# Patient Record
Sex: Male | Born: 1945 | Race: White | Hispanic: No | Marital: Married
Health system: Southern US, Academic
[De-identification: ages and names within clinical notes are randomized; demographics above are authoritative.]

## PROBLEM LIST (undated history)

## (undated) ENCOUNTER — Encounter: Attending: Internal Medicine | Primary: Internal Medicine

## (undated) ENCOUNTER — Encounter

## (undated) ENCOUNTER — Ambulatory Visit: Payer: MEDICARE

## (undated) ENCOUNTER — Ambulatory Visit: Payer: MEDICARE | Attending: Orthopaedic Trauma | Primary: Orthopaedic Trauma

## (undated) ENCOUNTER — Telehealth

## (undated) ENCOUNTER — Encounter
Attending: Student in an Organized Health Care Education/Training Program | Primary: Student in an Organized Health Care Education/Training Program

## (undated) ENCOUNTER — Telehealth: Attending: Internal Medicine | Primary: Internal Medicine

## (undated) ENCOUNTER — Encounter: Attending: Physician Assistant | Primary: Physician Assistant

## (undated) ENCOUNTER — Encounter
Attending: Pharmacist Clinician (PhC)/ Clinical Pharmacy Specialist | Primary: Pharmacist Clinician (PhC)/ Clinical Pharmacy Specialist

## (undated) ENCOUNTER — Ambulatory Visit: Payer: MEDICARE | Attending: Cardiovascular Disease | Primary: Cardiovascular Disease

## (undated) ENCOUNTER — Ambulatory Visit

## (undated) ENCOUNTER — Ambulatory Visit: Attending: Radiation Oncology | Primary: Radiation Oncology

## (undated) ENCOUNTER — Encounter: Attending: Cardiovascular Disease | Primary: Cardiovascular Disease

## (undated) ENCOUNTER — Ambulatory Visit: Attending: Physician Assistant | Primary: Physician Assistant

## (undated) ENCOUNTER — Telehealth: Attending: Hematology & Oncology | Primary: Hematology & Oncology

## (undated) ENCOUNTER — Inpatient Hospital Stay

## (undated) ENCOUNTER — Telehealth
Attending: Pharmacist Clinician (PhC)/ Clinical Pharmacy Specialist | Primary: Pharmacist Clinician (PhC)/ Clinical Pharmacy Specialist

## (undated) ENCOUNTER — Ambulatory Visit: Payer: MEDICARE | Attending: Radiation Oncology | Primary: Radiation Oncology

## (undated) ENCOUNTER — Telehealth
Attending: Student in an Organized Health Care Education/Training Program | Primary: Student in an Organized Health Care Education/Training Program

## (undated) ENCOUNTER — Encounter: Attending: Radiation Oncology | Primary: Radiation Oncology

## (undated) ENCOUNTER — Telehealth: Attending: Nurse Practitioner | Primary: Nurse Practitioner

## (undated) DIAGNOSIS — N289 Disorder of kidney and ureter, unspecified: Secondary | ICD-10-CM

## (undated) DIAGNOSIS — Z5189 Encounter for other specified aftercare: Secondary | ICD-10-CM

## (undated) DIAGNOSIS — G4730 Sleep apnea, unspecified: Secondary | ICD-10-CM

## (undated) DIAGNOSIS — I509 Heart failure, unspecified: Secondary | ICD-10-CM

## (undated) DIAGNOSIS — T7840XA Allergy, unspecified, initial encounter: Secondary | ICD-10-CM

## (undated) DIAGNOSIS — C61 Malignant neoplasm of prostate: Secondary | ICD-10-CM

## (undated) DIAGNOSIS — H269 Unspecified cataract: Secondary | ICD-10-CM

## (undated) DIAGNOSIS — C7951 Secondary malignant neoplasm of bone: Secondary | ICD-10-CM

## (undated) DIAGNOSIS — R011 Cardiac murmur, unspecified: Secondary | ICD-10-CM

## (undated) DIAGNOSIS — K250 Acute gastric ulcer with hemorrhage: Secondary | ICD-10-CM

## (undated) DIAGNOSIS — F419 Anxiety disorder, unspecified: Secondary | ICD-10-CM

## (undated) DIAGNOSIS — M1990 Unspecified osteoarthritis, unspecified site: Secondary | ICD-10-CM

## (undated) DIAGNOSIS — I219 Acute myocardial infarction, unspecified: Secondary | ICD-10-CM

## (undated) DIAGNOSIS — F32A Depression, unspecified: Secondary | ICD-10-CM

## (undated) DIAGNOSIS — K4090 Unilateral inguinal hernia, without obstruction or gangrene, not specified as recurrent: Secondary | ICD-10-CM

## (undated) HISTORY — DX: Heart failure, unspecified: I50.9

## (undated) HISTORY — DX: Anxiety disorder, unspecified: F41.9

## (undated) HISTORY — PX: EYE SURGERY: SHX253

## (undated) HISTORY — DX: Depression, unspecified: F32.A

## (undated) HISTORY — DX: Unspecified cataract: H26.9

## (undated) HISTORY — DX: Sleep apnea, unspecified: G47.30

## (undated) HISTORY — PX: GASTRIC BYPASS: SHX52

## (undated) HISTORY — PX: CHOLECYSTECTOMY: SHX55

## (undated) HISTORY — DX: Encounter for other specified aftercare: Z51.89

## (undated) HISTORY — PX: OTHER SURGICAL HISTORY: SHX169

## (undated) HISTORY — PX: CORONARY ARTERY BYPASS GRAFT: SHX141

## (undated) HISTORY — DX: Cardiac murmur, unspecified: R01.1

## (undated) HISTORY — DX: Allergy, unspecified, initial encounter: T78.40XA

## (undated) HISTORY — DX: Unspecified osteoarthritis, unspecified site: M19.90

## (undated) HISTORY — DX: Acute myocardial infarction, unspecified: I21.9

## (undated) HISTORY — DX: Secondary malignant neoplasm of bone: C79.51

## (undated) HISTORY — DX: Malignant neoplasm of prostate: C61

## (undated) MED ORDER — HYOSCYAMINE 0.125 MG SUBLINGUAL TABLET: ORAL_TABLET | SUBLINGUAL | 0 refills | 2 days | PRN

## (undated) MED ORDER — DEXAMETHASONE 2 MG TABLET: Freq: Every day | ORAL | 0 days

## (undated) MED ORDER — ACETAMINOPHEN 650 MG RECTAL SUPPOSITORY: Freq: Four times a day (QID) | RECTAL | 0 refills | 1 days | PRN

## (undated) MED ORDER — DOCUSATE SODIUM 100 MG CAPSULE: Freq: Two times a day (BID) | ORAL | 0 days

## (undated) MED ORDER — HALOPERIDOL LACTATE 2 MG/ML ORAL CONCENTRATE: Freq: Four times a day (QID) | ORAL | 0 refills | 8.00000 days | PRN

## (undated) MED ORDER — LIDOCAINE 5 % TOPICAL PATCH: TRANSDERMAL | 0 days

## (undated) MED ORDER — ASPIRIN 81 MG TABLET,DELAYED RELEASE: Freq: Every day | ORAL | 0 days

## (undated) MED ORDER — BISACODYL 5 MG TABLET,DELAYED RELEASE: Freq: Every day | ORAL | 0 days | PRN

## (undated) MED ORDER — SENNOSIDES 8.6 MG TABLET: Freq: Every day | ORAL | 0.00000 days

## (undated) MED ORDER — MECLIZINE 25 MG CHEWABLE TABLET: Freq: Three times a day (TID) | ORAL | 0 days | PRN

## (undated) MED ORDER — BISACODYL 10 MG RECTAL SUPPOSITORY: Freq: Every day | RECTAL | 0 refills | 0.00000 days | Status: SS | PRN

## (undated) MED ORDER — OXYCODONE ER 10 MG TABLET,CRUSH RESISTANT,EXTENDED RELEASE 12 HR: Freq: Two times a day (BID) | ORAL | 0 days

## (undated) MED ORDER — ASCORBIC ACID (VITAMIN C) 500 MG TABLET: Freq: Every day | ORAL | 0 days

## (undated) MED ORDER — LORAZEPAM 0.5 MG TABLET: ORAL_TABLET | Freq: Four times a day (QID) | ORAL | 0 refills | 3 days | PRN

## (undated) MED ORDER — MIDODRINE 5 MG TABLET: Freq: Three times a day (TID) | ORAL | 0 days

## (undated) MED ORDER — DRONABINOL 5 MG CAPSULE: Freq: Two times a day (BID) | ORAL | 0 days

## (undated) MED ORDER — POLYSACCHARIDE IRON COMPLEX 150 MG IRON CAPSULE: Freq: Every day | ORAL | 0.00000 days

## (undated) MED ORDER — ACETAMINOPHEN 500 MG TABLET: Freq: Three times a day (TID) | ORAL | 0 days | PRN

## (undated) MED ORDER — POLYETHYLENE GLYCOL 3350 17 GRAM ORAL POWDER PACKET: Freq: Every day | ORAL | 0.00000 days

## (undated) MED ORDER — PROCHLORPERAZINE MALEATE 10 MG TABLET: ORAL_TABLET | Freq: Four times a day (QID) | ORAL | 0 refills | 2 days | PRN

---

## 2015-10-14 DIAGNOSIS — Z952 Presence of prosthetic heart valve: Secondary | ICD-10-CM | POA: Insufficient documentation

## 2015-10-14 DIAGNOSIS — I10 Essential (primary) hypertension: Secondary | ICD-10-CM | POA: Insufficient documentation

## 2018-07-24 ENCOUNTER — Ambulatory Visit: Admit: 2018-07-24 | Discharge: 2018-08-22 | Payer: MEDICARE

## 2018-07-24 DIAGNOSIS — S8012XA Contusion of left lower leg, initial encounter: Principal | ICD-10-CM

## 2018-07-24 DIAGNOSIS — I872 Venous insufficiency (chronic) (peripheral): Secondary | ICD-10-CM

## 2018-12-15 MED ORDER — AMLODIPINE 2.5 MG TABLET
ORAL_TABLET | ORAL | 4 refills | 0.00000 days | Status: CP
Start: 2018-12-15 — End: ?

## 2018-12-18 ENCOUNTER — Ambulatory Visit: Admit: 2018-12-18 | Discharge: 2018-12-19 | Payer: MEDICARE

## 2018-12-18 DIAGNOSIS — E785 Hyperlipidemia, unspecified: Principal | ICD-10-CM

## 2019-01-05 ENCOUNTER — Ambulatory Visit
Admit: 2019-01-05 | Discharge: 2019-01-06 | Payer: MEDICARE | Attending: Cardiovascular Disease | Primary: Cardiovascular Disease

## 2019-01-05 DIAGNOSIS — N183 Chronic kidney disease, stage 3 unspecified: Secondary | ICD-10-CM | POA: Insufficient documentation

## 2019-01-05 DIAGNOSIS — Z953 Presence of xenogenic heart valve: Secondary | ICD-10-CM

## 2019-01-05 DIAGNOSIS — I340 Nonrheumatic mitral (valve) insufficiency: Secondary | ICD-10-CM

## 2019-01-05 DIAGNOSIS — E785 Hyperlipidemia, unspecified: Secondary | ICD-10-CM

## 2019-01-05 DIAGNOSIS — I361 Nonrheumatic tricuspid (valve) insufficiency: Secondary | ICD-10-CM

## 2019-01-05 DIAGNOSIS — Z955 Presence of coronary angioplasty implant and graft: Secondary | ICD-10-CM

## 2019-01-05 DIAGNOSIS — Z951 Presence of aortocoronary bypass graft: Secondary | ICD-10-CM

## 2019-01-05 DIAGNOSIS — I2510 Atherosclerotic heart disease of native coronary artery without angina pectoris: Principal | ICD-10-CM

## 2019-01-05 DIAGNOSIS — I739 Peripheral vascular disease, unspecified: Secondary | ICD-10-CM

## 2019-01-05 DIAGNOSIS — I6523 Occlusion and stenosis of bilateral carotid arteries: Secondary | ICD-10-CM

## 2019-01-05 DIAGNOSIS — I38 Endocarditis, valve unspecified: Secondary | ICD-10-CM

## 2019-01-05 DIAGNOSIS — E1122 Type 2 diabetes mellitus with diabetic chronic kidney disease: Secondary | ICD-10-CM

## 2019-01-05 DIAGNOSIS — R7303 Prediabetes: Secondary | ICD-10-CM | POA: Insufficient documentation

## 2019-03-04 MED ORDER — ATORVASTATIN 10 MG TABLET
ORAL_TABLET | ORAL | 3 refills | 0.00000 days | Status: CP
Start: 2019-03-04 — End: ?

## 2019-07-06 DIAGNOSIS — F17210 Nicotine dependence, cigarettes, uncomplicated: Secondary | ICD-10-CM | POA: Insufficient documentation

## 2019-11-05 ENCOUNTER — Ambulatory Visit: Admit: 2019-11-05 | Discharge: 2019-11-05 | Payer: MEDICARE

## 2019-11-05 ENCOUNTER — Ambulatory Visit
Admit: 2019-11-05 | Discharge: 2019-11-05 | Payer: MEDICARE | Attending: Cardiovascular Disease | Primary: Cardiovascular Disease

## 2019-11-05 DIAGNOSIS — R55 Syncope and collapse: Principal | ICD-10-CM

## 2019-11-05 DIAGNOSIS — E785 Hyperlipidemia, unspecified: Principal | ICD-10-CM

## 2019-11-05 DIAGNOSIS — I2510 Atherosclerotic heart disease of native coronary artery without angina pectoris: Principal | ICD-10-CM

## 2019-11-05 DIAGNOSIS — Z953 Presence of xenogenic heart valve: Principal | ICD-10-CM

## 2019-11-05 DIAGNOSIS — I361 Nonrheumatic tricuspid (valve) insufficiency: Principal | ICD-10-CM

## 2019-11-05 DIAGNOSIS — Z87891 Personal history of nicotine dependence: Principal | ICD-10-CM

## 2019-11-05 DIAGNOSIS — Z951 Presence of aortocoronary bypass graft: Principal | ICD-10-CM

## 2019-11-05 DIAGNOSIS — I38 Endocarditis, valve unspecified: Principal | ICD-10-CM

## 2019-11-05 DIAGNOSIS — I340 Nonrheumatic mitral (valve) insufficiency: Principal | ICD-10-CM

## 2019-11-05 DIAGNOSIS — Z955 Presence of coronary angioplasty implant and graft: Principal | ICD-10-CM

## 2019-11-05 DIAGNOSIS — I6523 Occlusion and stenosis of bilateral carotid arteries: Principal | ICD-10-CM

## 2019-11-24 ENCOUNTER — Ambulatory Visit: Admit: 2019-11-24 | Discharge: 2019-11-25 | Payer: MEDICARE

## 2019-12-07 ENCOUNTER — Ambulatory Visit: Admit: 2019-12-07 | Discharge: 2019-12-08 | Payer: MEDICARE

## 2020-02-22 ENCOUNTER — Ambulatory Visit
Admit: 2020-02-22 | Discharge: 2020-02-23 | Payer: MEDICARE | Attending: Cardiovascular Disease | Primary: Cardiovascular Disease

## 2020-02-22 DIAGNOSIS — R55 Syncope and collapse: Principal | ICD-10-CM

## 2020-02-22 DIAGNOSIS — I6523 Occlusion and stenosis of bilateral carotid arteries: Principal | ICD-10-CM

## 2020-02-22 DIAGNOSIS — I480 Paroxysmal atrial fibrillation: Principal | ICD-10-CM

## 2020-02-22 DIAGNOSIS — R002 Palpitations: Principal | ICD-10-CM

## 2020-02-22 DIAGNOSIS — I38 Endocarditis, valve unspecified: Principal | ICD-10-CM

## 2020-02-22 DIAGNOSIS — I739 Peripheral vascular disease, unspecified: Principal | ICD-10-CM

## 2020-02-22 DIAGNOSIS — E785 Hyperlipidemia, unspecified: Principal | ICD-10-CM

## 2020-02-22 DIAGNOSIS — Z951 Presence of aortocoronary bypass graft: Principal | ICD-10-CM

## 2020-02-22 DIAGNOSIS — R2689 Other abnormalities of gait and mobility: Principal | ICD-10-CM

## 2020-02-22 DIAGNOSIS — I2510 Atherosclerotic heart disease of native coronary artery without angina pectoris: Principal | ICD-10-CM

## 2020-02-22 DIAGNOSIS — Z953 Presence of xenogenic heart valve: Principal | ICD-10-CM

## 2020-02-22 DIAGNOSIS — Z955 Presence of coronary angioplasty implant and graft: Principal | ICD-10-CM

## 2020-02-22 DIAGNOSIS — I340 Nonrheumatic mitral (valve) insufficiency: Principal | ICD-10-CM

## 2020-02-22 DIAGNOSIS — M549 Dorsalgia, unspecified: Principal | ICD-10-CM

## 2020-02-22 DIAGNOSIS — R0789 Other chest pain: Principal | ICD-10-CM

## 2020-02-22 DIAGNOSIS — I472 Ventricular tachycardia: Principal | ICD-10-CM

## 2020-02-22 DIAGNOSIS — I361 Nonrheumatic tricuspid (valve) insufficiency: Principal | ICD-10-CM

## 2020-02-22 DIAGNOSIS — F17210 Nicotine dependence, cigarettes, uncomplicated: Principal | ICD-10-CM

## 2020-02-22 DIAGNOSIS — I10 Essential (primary) hypertension: Principal | ICD-10-CM

## 2020-02-22 MED ORDER — AMLODIPINE 2.5 MG TABLET
ORAL_TABLET | Freq: Every day | ORAL | 3 refills | 90.00000 days | Status: CP
Start: 2020-02-22 — End: ?

## 2020-02-23 ENCOUNTER — Ambulatory Visit: Admit: 2020-02-23 | Discharge: 2020-02-24 | Payer: MEDICARE

## 2020-02-25 ENCOUNTER — Ambulatory Visit: Admit: 2020-02-25 | Discharge: 2020-02-26 | Payer: MEDICARE

## 2020-03-02 DIAGNOSIS — M549 Dorsalgia, unspecified: Principal | ICD-10-CM

## 2020-03-02 DIAGNOSIS — F17210 Nicotine dependence, cigarettes, uncomplicated: Principal | ICD-10-CM

## 2020-03-02 DIAGNOSIS — Z951 Presence of aortocoronary bypass graft: Principal | ICD-10-CM

## 2020-03-02 DIAGNOSIS — I2510 Atherosclerotic heart disease of native coronary artery without angina pectoris: Principal | ICD-10-CM

## 2020-03-02 DIAGNOSIS — E785 Hyperlipidemia, unspecified: Principal | ICD-10-CM

## 2020-03-02 DIAGNOSIS — Z953 Presence of xenogenic heart valve: Principal | ICD-10-CM

## 2020-03-02 DIAGNOSIS — R0789 Other chest pain: Principal | ICD-10-CM

## 2020-03-02 DIAGNOSIS — I6523 Occlusion and stenosis of bilateral carotid arteries: Principal | ICD-10-CM

## 2020-03-02 DIAGNOSIS — I739 Peripheral vascular disease, unspecified: Principal | ICD-10-CM

## 2020-03-02 DIAGNOSIS — I10 Essential (primary) hypertension: Principal | ICD-10-CM

## 2020-03-02 DIAGNOSIS — R2689 Other abnormalities of gait and mobility: Principal | ICD-10-CM

## 2020-03-02 DIAGNOSIS — R55 Syncope and collapse: Principal | ICD-10-CM

## 2020-03-02 DIAGNOSIS — I38 Endocarditis, valve unspecified: Principal | ICD-10-CM

## 2020-03-03 ENCOUNTER — Ambulatory Visit: Admit: 2020-03-03 | Discharge: 2020-03-04 | Payer: MEDICARE

## 2020-03-16 DIAGNOSIS — R9439 Abnormal result of other cardiovascular function study: Principal | ICD-10-CM

## 2020-03-18 ENCOUNTER — Ambulatory Visit
Admit: 2020-03-18 | Discharge: 2020-03-19 | Payer: MEDICARE | Attending: Cardiovascular Disease | Primary: Cardiovascular Disease

## 2020-03-24 ENCOUNTER — Ambulatory Visit: Admit: 2020-03-24 | Discharge: 2020-03-24 | Payer: MEDICARE

## 2020-04-08 ENCOUNTER — Ambulatory Visit
Admit: 2020-04-08 | Discharge: 2020-04-09 | Payer: MEDICARE | Attending: Cardiovascular Disease | Primary: Cardiovascular Disease

## 2020-04-08 ENCOUNTER — Ambulatory Visit: Admit: 2020-04-08 | Discharge: 2020-04-09 | Payer: MEDICARE

## 2020-04-08 DIAGNOSIS — I472 Ventricular tachycardia: Principal | ICD-10-CM

## 2020-04-08 DIAGNOSIS — R55 Syncope and collapse: Principal | ICD-10-CM

## 2020-04-08 DIAGNOSIS — R002 Palpitations: Principal | ICD-10-CM

## 2020-04-08 DIAGNOSIS — I2510 Atherosclerotic heart disease of native coronary artery without angina pectoris: Principal | ICD-10-CM

## 2020-04-08 DIAGNOSIS — I480 Paroxysmal atrial fibrillation: Principal | ICD-10-CM

## 2020-04-08 DIAGNOSIS — R001 Bradycardia, unspecified: Principal | ICD-10-CM

## 2020-04-08 DIAGNOSIS — I38 Endocarditis, valve unspecified: Principal | ICD-10-CM

## 2020-04-08 NOTE — Unmapped (Signed)
04/08/2020    PCP: Garrel Ridgel, FNP  Primary Cardiologist:  Ian Bushman, MD [facility]         ASSESSMENT / PLAN  1. Coronary artery disease involving native coronary artery of native heart without angina pectoris  The results of cardiac catheterization were discussed and reviewed in detail with the patient today.           Recommend f/u with PCP for continued / established care  and annual screening including labs, testing, and health maintenance.    ----------------------------------------------------------------------------------------    SUBJECTIVE    HPI: Douglas Gray is a 74 y.o. male being evaluated.  Cardiac catheterization.    Cardiac catheterization at [facility] by me showed:  ??  Coronary Angiography:  ??  Left Main: Normal sized and free of disease.  LAD: Medium size vessel which is tortuous and has a 30% tubular narrowing in the midportion.  There is a 40% narrowing slightly more distally in the LAD itself but this is a small diameter vessel which has multiple luminal irregularities.  It does wrap around the apex of the left ventricle to supply a small portion of the inferior segment.  LCx: Large diameter, nondominant vessel which is free of significant stenosis.  The previously placed stent is widely patent.  Ramus: Medium sized vessel which has minor luminal irregularities.  RCA: Technically dominant vessel which is totally occluded in its distal aspect.  The right PDA and the right posterolateral branch fill from the saphenous vein graft.  Grafts:   The saphenous vein graft to the distal right coronary artery is widely patent.  The anastomosis with the distal right coronary is somewhat hazy but does not appear to have a significant stenosis.  ??  Hemodynamic Results:   Ao: 180/66, mean = 108   LV: 195/13-31  ??  LV Angiography: Reveals symmetrical ventricular contractions.  There are no areas of akinesis, hypokinesis or dyskinesis.  There is no significant mitral regurgitation.  The ejection fraction is estimated to be greater than 55%.  ??  Assessment / Plan:  ?? Borderline significant coronary artery disease involving the mid left anterior descending coronary artery  ?? Total occlusion of the right coronary artery  ?? Widely patent saphenous vein graft to the distal right coronary artery branches  ?? Normal left ventricular systolic function with an elevated left ventricular end-diastolic pressure.  ??  ??  Ian Bushman, MD, [facility]  03/24/2020, 1:22 PM    Medical History:  Past Medical History:   Diagnosis Date   ??? Anxiety    ??? Arthritis    ??? Cataract    ??? CHF (congestive heart failure) (CMS-HCC)    ??? Chronic hypertension 02/25/2004   ??? Chronic kidney disease, stage 3, mod decreased GFR     Cindra Presume, MD   ??? Cigarette smoker    ??? COPD (chronic obstructive pulmonary disease) (CMS-HCC)    ??? Coronary artery disease 02/25/2004    Prior CABG and coronary stents.   ??? Depression    ??? Diabetes mellitus type II, controlled, with no complications (CMS-HCC) 11/10/2008   ??? DVT (deep vein thrombosis) in pregnancy 06/28/2006   ??? Heart murmur    ??? Hemorrhage of gastrointestinal tract 01/12/2010   ??? History of nephrolithiasis 2013    with urosepsis.   ??? History of pneumonia    ??? Hx of CABG    ??? Hyperlipidemia LDL goal <70 02/25/2004   ??? Leg pain    ??? Lymphedema  06/07/2009    Recommended rest, elevation, compression stockings.   ??? Micturition frequency    ??? Myocardial infarction (CMS-HCC)    ??? Obesity 04/22/2007   ??? Peptic ulceration    ??? Peripheral artery disease (CMS-HCC) 06/28/2006   ??? Peripheral edema    ??? 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 Syncope and collapse    ??? Valvular heart disease      Past Surgical History:   Procedure Laterality Date   ??? AVR with CABG  05/08/2007    Aortic valve replacement with a 25 mm Edwards-magna pericardial valve and saphenous vein graft to the distal right coronary artery and posterior descending artery. Jerilee Hoh, MD.   ??? BARIATRIC SURGERY  2010   ??? CARDIAC VALVE REPLACEMENT     ??? CHOLECYSTECTOMY     ??? CORONARY ARTERY BYPASS GRAFT     ??? Coronary stents - LCX      3 x 24 & 3.5 x 16 Taxus   ??? CYSTOSCOPY WITH LEFT URETEROSCOPY. LASER LITHOTRIPSY AND BASKET STONE EXTRACTION  05/09/2012    M. Isaac Bliss, MD   ??? EYE SURGERY     ??? HERNIA REPAIR     ??? PR CATH PLACE/CORON ANGIO, IMG SUPER/INTERP,W LEFT HEART VENTRICULOGRAPHY N/A 03/24/2020    Procedure: LEFT HEART CATHETERIZATION WITH POSSIBLE REVASCULARIZATION;  Surgeon: Lesle Chris, MD;  Location: [facility];  Service: Cardiology   ??? PRIOR BILATERAL PATELLA SURGERY  1968 & 1973   ??? VAGOTOMY         Medications:   Current Outpatient Medications   Medication Sig Dispense Refill   ??? amLODIPine (NORVASC) 2.5 MG tablet Take 1 tablet (2.5 mg total) by mouth daily. 90 tablet 3   ??? atorvastatin (LIPITOR) 10 MG tablet Take 10 mg by mouth daily.     ??? citalopram (CELEXA) 20 MG tablet Take 20 mg by mouth daily.      ??? ergocalciferol-1,250 mcg, 50,000 unit, (DRISDOL) 1,250 mcg (50,000 unit) capsule Take 1 capsule by mouth once a week.     ??? furosemide (LASIX) 40 MG tablet Take 40 mg by mouth daily as needed.      ??? losartan (COZAAR) 100 MG tablet Take 50 mg by mouth daily.      ??? methylphenidate HCl (CONCERTA) 27 MG CR tablet Take 27 mg by mouth.     ??? traMADoL (ULTRAM) 50 mg tablet Take 50 mg by mouth every eight (8) hours as needed.      ??? traZODone (DESYREL) 50 MG tablet Take 100 mg by mouth nightly.   2   ??? zolpidem (AMBIEN) 10 mg tablet Take 10 mg by mouth nightly.  0     No current facility-administered medications for this visit.       Allergies:   Allergies   Allergen Reactions   ??? Metformin Swelling   ??? Mirtazapine Anxiety     Memory confusion   ??? Janumet [Sitagliptin-Metformin]    ??? Indomethacin Anxiety     (INDOCIN)         Social History:  Social History     Tobacco Use   Smoking Status Current Every Day Smoker   ??? Packs/day: 0.50   ??? Years: 55.00   ??? Pack years: 27.50   ??? Types: Cigarettes   Smokeless Tobacco Never Used Social History     Substance and Sexual Activity   Alcohol Use Never     Social History  Substance and Sexual Activity   Drug Use Never       Family History:  Family History   Problem Relation Age of Onset   ??? Heart disease Mother    ??? Stroke Mother    ??? Cancer Father    ??? Depression Father    ??? Heart disease Father    ??? Breast cancer Sister    ??? Heart attack Brother    ??? Heart attack Brother    ??? Heart attack Brother        Other past medical history, medications, allergies and problem list reviewed in the medical record    REVIEW OF SYSTEMS:  Review of Systems   Constitution: Negative for chills, diaphoresis, fever, weakness, weight gain and weight loss.   HENT: Negative for headaches and sore throat.    Eyes: Negative for blurred vision and visual disturbance.   Cardiovascular: Negative for chest pain, claudication, dyspnea on exertion, irregular heartbeat, leg swelling, near-syncope, orthopnea, palpitations, paroxysmal nocturnal dyspnea and syncope.   Respiratory: Negative for cough, shortness of breath, snoring and wheezing.    Endocrine: Negative for cold intolerance and heat intolerance.   Skin: Negative for color change, dry skin, itching and rash.   Musculoskeletal: Negative for arthritis, back pain, joint pain, joint swelling and muscle cramps.   Gastrointestinal: Negative for abdominal pain, constipation, diarrhea, heartburn, hematochezia, melena, nausea and vomiting.   Genitourinary: Negative for hematuria.   Neurological: Negative for dizziness, light-headedness and numbness.   Allergic/Immunologic: Negative for environmental allergies.     All positive and pertinent negatives are noted in the HPI; otherwise, all other systems are negative.        OBJECTIVE    BP Readings from Last 3 Encounters:   04/08/20 140/79   03/24/20 163/71   03/18/20 130/70     Wt Readings from Last 3 Encounters:   04/08/20 85.3 kg (188 lb)   03/24/20 83.9 kg (184 lb 15.5 oz)   03/18/20 83.5 kg (184 lb)     Vitals: 04/08/20 1022   BP: 140/79   Pulse: 60   Resp: 16   Weight: 85.3 kg (188 lb)   Height: 180.3 cm (5' 11)     Constitutional: Well developed, well nourished, in no acute distress.  Extremities: No clubbing or cyanosis, no edema.  Peripheral pulses at the femorals, radials, PTs and DPs are 2+ and equal bilaterally.         Recent CV pertinent labs:  Lab Results   Component Value Date    WBC 8.0 03/24/2020    HGB 14.3 03/24/2020    HCT 43.1 03/24/2020    PLT 160 03/24/2020       Lab Results   Component Value Date    NA 143 03/24/2020    K 4.2 03/24/2020    CL 113 (H) 03/24/2020    CO2 25.0 03/24/2020    BUN 20 03/24/2020    CREATININE 1.52 (H) 03/24/2020    GLU 109 (H) 03/24/2020    CALCIUM 10.0 03/24/2020    MG 2.0 11/05/2019    PHOS 2.1 (L) 04/09/2012       Lab Results   Component Value Date    BILITOT 0.6 11/05/2019    BILIDIR 0.2 04/10/2012    PROT 6.1 11/05/2019    ALBUMIN 3.7 11/05/2019    ALT 9 (L) 11/05/2019    AST 13 11/05/2019    ALKPHOS 102 11/05/2019       Lab Results   Component  Value Date    PT 13.9 03/24/2020    INR 1.05 03/24/2020    APTT 29.0 03/24/2020       This note was created utilizing [employer] dictation software, and may contain transcription errors that were missed during proofreading.  Any errors missed are inadvertent and do not reflect the quality of care.      Ian Bushman, MD, [facility]  04/08/2020,10:26 AM

## 2020-04-21 ENCOUNTER — Ambulatory Visit: Admit: 2020-04-21 | Discharge: 2020-04-21 | Payer: MEDICARE

## 2020-05-25 ENCOUNTER — Ambulatory Visit
Admit: 2020-05-25 | Discharge: 2020-05-25 | Payer: MEDICARE | Attending: Cardiovascular Disease | Primary: Cardiovascular Disease

## 2020-05-25 ENCOUNTER — Ambulatory Visit: Admit: 2020-05-25 | Discharge: 2020-05-25 | Payer: MEDICARE

## 2020-05-25 DIAGNOSIS — E1122 Type 2 diabetes mellitus with diabetic chronic kidney disease: Secondary | ICD-10-CM

## 2020-05-25 DIAGNOSIS — R0600 Dyspnea, unspecified: Principal | ICD-10-CM

## 2020-05-25 DIAGNOSIS — I361 Nonrheumatic tricuspid (valve) insufficiency: Principal | ICD-10-CM

## 2020-05-25 DIAGNOSIS — E785 Hyperlipidemia, unspecified: Principal | ICD-10-CM

## 2020-05-25 DIAGNOSIS — N183 Stage 3 chronic kidney disease, unspecified whether stage 3a or 3b CKD: Principal | ICD-10-CM

## 2020-05-25 DIAGNOSIS — I6523 Occlusion and stenosis of bilateral carotid arteries: Principal | ICD-10-CM

## 2020-05-25 DIAGNOSIS — Z951 Presence of aortocoronary bypass graft: Principal | ICD-10-CM

## 2020-05-25 DIAGNOSIS — Z9181 History of falling: Principal | ICD-10-CM

## 2020-05-25 DIAGNOSIS — Z955 Presence of coronary angioplasty implant and graft: Principal | ICD-10-CM

## 2020-05-25 DIAGNOSIS — Z953 Presence of xenogenic heart valve: Principal | ICD-10-CM

## 2020-05-25 DIAGNOSIS — I2510 Atherosclerotic heart disease of native coronary artery without angina pectoris: Principal | ICD-10-CM

## 2020-05-25 DIAGNOSIS — F17210 Nicotine dependence, cigarettes, uncomplicated: Principal | ICD-10-CM

## 2020-05-25 DIAGNOSIS — I38 Endocarditis, valve unspecified: Principal | ICD-10-CM

## 2020-05-25 DIAGNOSIS — I739 Peripheral vascular disease, unspecified: Principal | ICD-10-CM

## 2020-05-25 DIAGNOSIS — I472 Ventricular tachycardia: Principal | ICD-10-CM

## 2020-05-25 DIAGNOSIS — I480 Paroxysmal atrial fibrillation: Principal | ICD-10-CM

## 2020-05-25 DIAGNOSIS — I340 Nonrheumatic mitral (valve) insufficiency: Principal | ICD-10-CM

## 2020-05-25 DIAGNOSIS — R2689 Other abnormalities of gait and mobility: Principal | ICD-10-CM

## 2020-05-25 MED ORDER — ATORVASTATIN 10 MG TABLET
ORAL_TABLET | Freq: Every day | ORAL | 1 refills | 90.00000 days | Status: CP
Start: 2020-05-25 — End: 2020-11-21

## 2020-05-27 ENCOUNTER — Institutional Professional Consult (permissible substitution): Admit: 2020-05-27 | Discharge: 2020-05-28 | Payer: MEDICARE

## 2020-05-27 DIAGNOSIS — I38 Endocarditis, valve unspecified: Principal | ICD-10-CM

## 2020-05-27 DIAGNOSIS — Z951 Presence of aortocoronary bypass graft: Principal | ICD-10-CM

## 2020-05-27 DIAGNOSIS — R2689 Other abnormalities of gait and mobility: Principal | ICD-10-CM

## 2020-05-27 DIAGNOSIS — R001 Bradycardia, unspecified: Principal | ICD-10-CM

## 2020-05-27 DIAGNOSIS — Z953 Presence of xenogenic heart valve: Principal | ICD-10-CM

## 2020-05-27 DIAGNOSIS — I2510 Atherosclerotic heart disease of native coronary artery without angina pectoris: Principal | ICD-10-CM

## 2020-07-01 ENCOUNTER — Ambulatory Visit: Admit: 2020-07-01 | Payer: MEDICARE

## 2020-07-29 ENCOUNTER — Ambulatory Visit: Admit: 2020-07-29 | Payer: MEDICARE

## 2020-08-11 ENCOUNTER — Ambulatory Visit: Admit: 2020-08-11 | Payer: MEDICARE

## 2020-09-02 ENCOUNTER — Ambulatory Visit: Admit: 2020-09-02 | Payer: MEDICARE

## 2020-09-30 ENCOUNTER — Ambulatory Visit: Admit: 2020-09-30 | Payer: MEDICARE

## 2020-10-11 DIAGNOSIS — K279 Peptic ulcer, site unspecified, unspecified as acute or chronic, without hemorrhage or perforation: Secondary | ICD-10-CM | POA: Insufficient documentation

## 2020-11-23 ENCOUNTER — Other Ambulatory Visit: Payer: Self-pay

## 2020-11-23 ENCOUNTER — Emergency Department
Admission: EM | Admit: 2020-11-23 | Discharge: 2020-11-23 | Disposition: A | Discharge status: Home / Self Care | Payer: Medicare Other | Attending: Emergency Medicine | Admitting: Emergency Medicine

## 2020-11-23 DIAGNOSIS — K921 Melena: Secondary | ICD-10-CM | POA: Diagnosis present

## 2020-11-23 DIAGNOSIS — Z5321 Procedure and treatment not carried out due to patient leaving prior to being seen by health care provider: Secondary | ICD-10-CM | POA: Insufficient documentation

## 2020-11-23 DIAGNOSIS — R630 Anorexia: Secondary | ICD-10-CM | POA: Insufficient documentation

## 2020-11-23 HISTORY — DX: Disorder of kidney and ureter, unspecified: N28.9

## 2020-11-23 HISTORY — DX: Unilateral inguinal hernia, without obstruction or gangrene, not specified as recurrent: K40.90

## 2020-11-23 HISTORY — DX: Acute gastric ulcer with hemorrhage: K25.0

## 2020-11-23 LAB — CBC
HCT: 40.9 % (ref 39.0–52.0)
Hemoglobin: 13.6 g/dL (ref 13.0–17.0)
MCH: 29.4 pg (ref 26.0–34.0)
MCHC: 33.3 g/dL (ref 30.0–36.0)
MCV: 88.3 fL (ref 80.0–100.0)
Platelets: 204 10*3/uL (ref 150–400)
RBC: 4.63 MIL/uL (ref 4.22–5.81)
RDW: 13.8 % (ref 11.5–15.5)
WBC: 8.4 10*3/uL (ref 4.0–10.5)
nRBC: 0 % (ref 0.0–0.2)

## 2020-11-23 LAB — COMPREHENSIVE METABOLIC PANEL
ALT: 9 U/L (ref 0–44)
AST: 23 U/L (ref 15–41)
Albumin: 3.2 g/dL — ABNORMAL LOW (ref 3.5–5.0)
Alkaline Phosphatase: 366 U/L — ABNORMAL HIGH (ref 38–126)
Anion gap: 14 (ref 5–15)
BUN: 29 mg/dL — ABNORMAL HIGH (ref 8–23)
CO2: 23 mmol/L (ref 22–32)
Calcium: 8.8 mg/dL — ABNORMAL LOW (ref 8.9–10.3)
Chloride: 104 mmol/L (ref 98–111)
Creatinine, Ser: 1.44 mg/dL — ABNORMAL HIGH (ref 0.61–1.24)
GFR, Estimated: 51 mL/min — ABNORMAL LOW (ref >60)
Glucose, Bld: 116 mg/dL — ABNORMAL HIGH (ref 70–99)
Potassium: 4.4 mmol/L (ref 3.5–5.1)
Sodium: 141 mmol/L (ref 135–145)
Total Bilirubin: 1.4 mg/dL — ABNORMAL HIGH (ref 0.3–1.2)
Total Protein: 6.6 g/dL (ref 6.5–8.1)

## 2020-11-23 LAB — TYPE AND SCREEN
ABO/RH(D): A POS
Antibody Screen: NEGATIVE

## 2020-11-23 LAB — PROTIME-INR
INR: 1.1 (ref 0.8–1.2)
Prothrombin Time: 13.9 seconds (ref 11.4–15.2)

## 2020-11-23 NOTE — ED Triage Notes (Signed)
PT to ED c/o blood in stool for a few days. Stool is black. HX of bleeding ulcer a few years ago. No abd pain Pt states poor appetite and loss of 30lbs in a matter of weeks.

## 2020-11-24 ENCOUNTER — Ambulatory Visit: Admit: 2020-11-24 | Discharge: 2020-11-28 | Disposition: A | Discharge status: Home Health | Payer: MEDICARE

## 2020-11-24 ENCOUNTER — Encounter: Admit: 2020-11-24 | Discharge: 2020-11-28 | Disposition: A | Discharge status: Home Health | Payer: MEDICARE

## 2020-11-24 ENCOUNTER — Encounter
Admit: 2020-11-24 | Discharge: 2020-11-28 | Disposition: A | Discharge status: Home Health | Payer: MEDICARE | Attending: Nurse Practitioner

## 2020-11-24 DIAGNOSIS — R8271 Bacteriuria: Principal | ICD-10-CM

## 2020-11-24 DIAGNOSIS — E785 Hyperlipidemia, unspecified: Principal | ICD-10-CM

## 2020-11-24 DIAGNOSIS — Z6822 Body mass index (BMI) 22.0-22.9, adult: Principal | ICD-10-CM

## 2020-11-24 DIAGNOSIS — I5022 Chronic systolic (congestive) heart failure: Principal | ICD-10-CM

## 2020-11-24 DIAGNOSIS — R079 Chest pain, unspecified: Principal | ICD-10-CM

## 2020-11-24 DIAGNOSIS — F909 Attention-deficit hyperactivity disorder, unspecified type: Principal | ICD-10-CM

## 2020-11-24 DIAGNOSIS — R634 Abnormal weight loss: Principal | ICD-10-CM

## 2020-11-24 DIAGNOSIS — G3184 Mild cognitive impairment, so stated: Principal | ICD-10-CM

## 2020-11-24 DIAGNOSIS — F32A Depression, unspecified: Principal | ICD-10-CM

## 2020-11-24 DIAGNOSIS — I252 Old myocardial infarction: Principal | ICD-10-CM

## 2020-11-24 DIAGNOSIS — Z951 Presence of aortocoronary bypass graft: Principal | ICD-10-CM

## 2020-11-24 DIAGNOSIS — N183 Chronic kidney disease, stage 3 unspecified: Principal | ICD-10-CM

## 2020-11-24 DIAGNOSIS — I130 Hypertensive heart and chronic kidney disease with heart failure and stage 1 through stage 4 chronic kidney disease, or unspecified chronic kidney disease: Principal | ICD-10-CM

## 2020-11-24 DIAGNOSIS — Z716 Tobacco abuse counseling: Principal | ICD-10-CM

## 2020-11-24 DIAGNOSIS — Z803 Family history of malignant neoplasm of breast: Principal | ICD-10-CM

## 2020-11-24 DIAGNOSIS — D62 Acute posthemorrhagic anemia: Principal | ICD-10-CM

## 2020-11-24 DIAGNOSIS — F419 Anxiety disorder, unspecified: Principal | ICD-10-CM

## 2020-11-24 DIAGNOSIS — R972 Elevated prostate specific antigen [PSA]: Principal | ICD-10-CM

## 2020-11-24 DIAGNOSIS — F17210 Nicotine dependence, cigarettes, uncomplicated: Principal | ICD-10-CM

## 2020-11-24 DIAGNOSIS — Z8711 Personal history of peptic ulcer disease: Principal | ICD-10-CM

## 2020-11-24 DIAGNOSIS — I739 Peripheral vascular disease, unspecified: Principal | ICD-10-CM

## 2020-11-24 DIAGNOSIS — Z20822 Contact with and (suspected) exposure to covid-19: Principal | ICD-10-CM

## 2020-11-24 DIAGNOSIS — J449 Chronic obstructive pulmonary disease, unspecified: Principal | ICD-10-CM

## 2020-11-24 DIAGNOSIS — I2510 Atherosclerotic heart disease of native coronary artery without angina pectoris: Principal | ICD-10-CM

## 2020-11-24 DIAGNOSIS — C61 Malignant neoplasm of prostate: Principal | ICD-10-CM

## 2020-11-24 DIAGNOSIS — N400 Enlarged prostate without lower urinary tract symptoms: Principal | ICD-10-CM

## 2020-11-24 DIAGNOSIS — R627 Adult failure to thrive: Principal | ICD-10-CM

## 2020-11-24 DIAGNOSIS — R54 Age-related physical debility: Principal | ICD-10-CM

## 2020-11-24 DIAGNOSIS — K284 Chronic or unspecified gastrojejunal ulcer with hemorrhage: Principal | ICD-10-CM

## 2020-11-24 DIAGNOSIS — K5730 Diverticulosis of large intestine without perforation or abscess without bleeding: Principal | ICD-10-CM

## 2020-11-24 DIAGNOSIS — Z953 Presence of xenogenic heart valve: Principal | ICD-10-CM

## 2020-11-24 DIAGNOSIS — M899 Disorder of bone, unspecified: Principal | ICD-10-CM

## 2020-11-24 DIAGNOSIS — K2270 Barrett's esophagus without dysplasia: Principal | ICD-10-CM

## 2020-11-24 DIAGNOSIS — C7951 Secondary malignant neoplasm of bone: Principal | ICD-10-CM

## 2020-11-24 DIAGNOSIS — Z9884 Bariatric surgery status: Principal | ICD-10-CM

## 2020-11-24 DIAGNOSIS — K921 Melena: Principal | ICD-10-CM

## 2020-11-28 DIAGNOSIS — I5022 Chronic systolic (congestive) heart failure: Principal | ICD-10-CM

## 2020-11-28 DIAGNOSIS — F909 Attention-deficit hyperactivity disorder, unspecified type: Principal | ICD-10-CM

## 2020-11-28 DIAGNOSIS — F419 Anxiety disorder, unspecified: Principal | ICD-10-CM

## 2020-11-28 DIAGNOSIS — R8271 Bacteriuria: Principal | ICD-10-CM

## 2020-11-28 DIAGNOSIS — Z6822 Body mass index (BMI) 22.0-22.9, adult: Principal | ICD-10-CM

## 2020-11-28 DIAGNOSIS — Z803 Family history of malignant neoplasm of breast: Principal | ICD-10-CM

## 2020-11-28 DIAGNOSIS — K5730 Diverticulosis of large intestine without perforation or abscess without bleeding: Principal | ICD-10-CM

## 2020-11-28 DIAGNOSIS — K2270 Barrett's esophagus without dysplasia: Principal | ICD-10-CM

## 2020-11-28 DIAGNOSIS — I252 Old myocardial infarction: Principal | ICD-10-CM

## 2020-11-28 DIAGNOSIS — F32A Depression, unspecified: Principal | ICD-10-CM

## 2020-11-28 DIAGNOSIS — N400 Enlarged prostate without lower urinary tract symptoms: Principal | ICD-10-CM

## 2020-11-28 DIAGNOSIS — C7951 Secondary malignant neoplasm of bone: Principal | ICD-10-CM

## 2020-11-28 DIAGNOSIS — E785 Hyperlipidemia, unspecified: Principal | ICD-10-CM

## 2020-11-28 DIAGNOSIS — D62 Acute posthemorrhagic anemia: Principal | ICD-10-CM

## 2020-11-28 DIAGNOSIS — Z8711 Personal history of peptic ulcer disease: Principal | ICD-10-CM

## 2020-11-28 DIAGNOSIS — I2510 Atherosclerotic heart disease of native coronary artery without angina pectoris: Principal | ICD-10-CM

## 2020-11-28 DIAGNOSIS — J449 Chronic obstructive pulmonary disease, unspecified: Principal | ICD-10-CM

## 2020-11-28 DIAGNOSIS — G3184 Mild cognitive impairment, so stated: Principal | ICD-10-CM

## 2020-11-28 DIAGNOSIS — Z951 Presence of aortocoronary bypass graft: Principal | ICD-10-CM

## 2020-11-28 DIAGNOSIS — Z953 Presence of xenogenic heart valve: Principal | ICD-10-CM

## 2020-11-28 DIAGNOSIS — M899 Disorder of bone, unspecified: Principal | ICD-10-CM

## 2020-11-28 DIAGNOSIS — N183 Chronic kidney disease, stage 3 unspecified: Principal | ICD-10-CM

## 2020-11-28 DIAGNOSIS — I739 Peripheral vascular disease, unspecified: Principal | ICD-10-CM

## 2020-11-28 DIAGNOSIS — R634 Abnormal weight loss: Principal | ICD-10-CM

## 2020-11-28 DIAGNOSIS — Z20822 Contact with and (suspected) exposure to covid-19: Principal | ICD-10-CM

## 2020-11-28 DIAGNOSIS — R54 Age-related physical debility: Principal | ICD-10-CM

## 2020-11-28 DIAGNOSIS — I130 Hypertensive heart and chronic kidney disease with heart failure and stage 1 through stage 4 chronic kidney disease, or unspecified chronic kidney disease: Principal | ICD-10-CM

## 2020-11-28 DIAGNOSIS — R627 Adult failure to thrive: Principal | ICD-10-CM

## 2020-11-28 DIAGNOSIS — C61 Malignant neoplasm of prostate: Principal | ICD-10-CM

## 2020-11-28 DIAGNOSIS — K921 Melena: Principal | ICD-10-CM

## 2020-11-28 DIAGNOSIS — Z9884 Bariatric surgery status: Principal | ICD-10-CM

## 2020-11-28 DIAGNOSIS — Z716 Tobacco abuse counseling: Principal | ICD-10-CM

## 2020-11-28 DIAGNOSIS — K284 Chronic or unspecified gastrojejunal ulcer with hemorrhage: Principal | ICD-10-CM

## 2020-11-28 DIAGNOSIS — F17210 Nicotine dependence, cigarettes, uncomplicated: Principal | ICD-10-CM

## 2020-11-28 MED ORDER — ENEMA 19 GRAM-7 GRAM/118 ML
Freq: Once | RECTAL | 0 refills | 1.00000 days | Status: CP
Start: 2020-11-28 — End: 2020-11-28

## 2020-11-28 MED ORDER — SUCRALFATE 100 MG/ML ORAL SUSPENSION
Freq: Four times a day (QID) | ORAL | 0 refills | 11 days | Status: CP
Start: 2020-11-28 — End: 2020-12-19

## 2020-11-28 MED ORDER — OMEPRAZOLE 40 MG CAPSULE,DELAYED RELEASE
ORAL_CAPSULE | Freq: Two times a day (BID) | ORAL | 1 refills | 30 days | Status: CP
Start: 2020-11-28 — End: 2021-01-27

## 2020-11-28 MED ORDER — LEVOFLOXACIN 500 MG TABLET
ORAL_TABLET | Freq: Every day | ORAL | 0 refills | 1.00000 days | Status: CP
Start: 2020-11-28 — End: 2020-12-19

## 2020-11-28 MED ORDER — ATORVASTATIN 10 MG TABLET
ORAL_TABLET | Freq: Every day | ORAL | 0 refills | 30.00000 days
Start: 2020-11-28 — End: 2020-12-26

## 2020-11-30 ENCOUNTER — Ambulatory Visit: Admit: 2020-11-30 | Discharge: 2020-12-01 | Payer: MEDICARE

## 2020-11-30 DIAGNOSIS — C61 Malignant neoplasm of prostate: Principal | ICD-10-CM

## 2020-11-30 DIAGNOSIS — C7951 Secondary malignant neoplasm of bone: Principal | ICD-10-CM

## 2020-11-30 DIAGNOSIS — R972 Elevated prostate specific antigen [PSA]: Principal | ICD-10-CM

## 2020-12-01 DIAGNOSIS — Z79899 Other long term (current) drug therapy: Principal | ICD-10-CM

## 2020-12-01 DIAGNOSIS — I2510 Atherosclerotic heart disease of native coronary artery without angina pectoris: Principal | ICD-10-CM

## 2020-12-01 DIAGNOSIS — E1122 Type 2 diabetes mellitus with diabetic chronic kidney disease: Principal | ICD-10-CM

## 2020-12-01 DIAGNOSIS — F17210 Nicotine dependence, cigarettes, uncomplicated: Principal | ICD-10-CM

## 2020-12-01 DIAGNOSIS — N183 Chronic kidney disease, stage 3 unspecified: Principal | ICD-10-CM

## 2020-12-01 DIAGNOSIS — R1031 Right lower quadrant pain: Principal | ICD-10-CM

## 2020-12-01 DIAGNOSIS — J449 Chronic obstructive pulmonary disease, unspecified: Principal | ICD-10-CM

## 2020-12-01 DIAGNOSIS — I509 Heart failure, unspecified: Principal | ICD-10-CM

## 2020-12-01 DIAGNOSIS — M549 Dorsalgia, unspecified: Principal | ICD-10-CM

## 2020-12-01 DIAGNOSIS — I130 Hypertensive heart and chronic kidney disease with heart failure and stage 1 through stage 4 chronic kidney disease, or unspecified chronic kidney disease: Principal | ICD-10-CM

## 2020-12-02 ENCOUNTER — Emergency Department: Admit: 2020-12-02 | Discharge: 2020-12-02 | Disposition: A | Discharge status: Home / Self Care | Payer: MEDICARE

## 2020-12-02 ENCOUNTER — Ambulatory Visit: Admit: 2020-12-02 | Discharge: 2020-12-02 | Disposition: A | Discharge status: Home / Self Care | Payer: MEDICARE

## 2020-12-02 DIAGNOSIS — I2510 Atherosclerotic heart disease of native coronary artery without angina pectoris: Principal | ICD-10-CM

## 2020-12-02 DIAGNOSIS — I130 Hypertensive heart and chronic kidney disease with heart failure and stage 1 through stage 4 chronic kidney disease, or unspecified chronic kidney disease: Principal | ICD-10-CM

## 2020-12-02 DIAGNOSIS — N183 Chronic kidney disease, stage 3 unspecified: Principal | ICD-10-CM

## 2020-12-02 DIAGNOSIS — F17210 Nicotine dependence, cigarettes, uncomplicated: Principal | ICD-10-CM

## 2020-12-02 DIAGNOSIS — E1122 Type 2 diabetes mellitus with diabetic chronic kidney disease: Principal | ICD-10-CM

## 2020-12-02 DIAGNOSIS — R1031 Right lower quadrant pain: Principal | ICD-10-CM

## 2020-12-02 DIAGNOSIS — J449 Chronic obstructive pulmonary disease, unspecified: Principal | ICD-10-CM

## 2020-12-02 DIAGNOSIS — M549 Dorsalgia, unspecified: Principal | ICD-10-CM

## 2020-12-02 DIAGNOSIS — Z79899 Other long term (current) drug therapy: Principal | ICD-10-CM

## 2020-12-02 DIAGNOSIS — I509 Heart failure, unspecified: Principal | ICD-10-CM

## 2020-12-02 MED ORDER — OXYCODONE 5 MG TABLET
ORAL_TABLET | ORAL | 0 refills | 2.00000 days | Status: CP | PRN
Start: 2020-12-02 — End: 2020-12-02

## 2020-12-02 MED ORDER — DOCUSATE SODIUM 100 MG CAPSULE
ORAL_CAPSULE | Freq: Two times a day (BID) | ORAL | 0 refills | 30.00000 days | Status: CP
Start: 2020-12-02 — End: 2021-01-16

## 2020-12-07 ENCOUNTER — Telehealth
Admit: 2020-12-07 | Discharge: 2020-12-08 | Payer: MEDICARE | Attending: Nurse Practitioner | Primary: Nurse Practitioner

## 2020-12-07 DIAGNOSIS — C61 Malignant neoplasm of prostate: Principal | ICD-10-CM

## 2020-12-07 DIAGNOSIS — M899 Disorder of bone, unspecified: Principal | ICD-10-CM

## 2020-12-12 ENCOUNTER — Telehealth: Payer: Self-pay

## 2020-12-12 NOTE — Telephone Encounter (Signed)
Scheduled 1/31

## 2020-12-12 NOTE — Telephone Encounter (Signed)
Copied from [place] [id number]. Topic: General - Other >> Dec 12, 2020 10:49 AM Celene Kras wrote: Reason for CRM: Pts wife called stating that her  PCP Caesar Bookman reccomeneded [facility] for him. She states that she did receive approval to become a new patient and is requesting to have someone give a call back. Please advise.

## 2020-12-12 NOTE — Telephone Encounter (Signed)
Yes, PCP did contact provider and recommend provider to patient, I am okay with new patient visit.  Thank you:)

## 2020-12-16 ENCOUNTER — Ambulatory Visit: Admit: 2020-12-16 | Discharge: 2020-12-29 | Payer: MEDICARE

## 2020-12-16 ENCOUNTER — Institutional Professional Consult (permissible substitution): Admit: 2020-12-16 | Discharge: 2020-12-17 | Payer: MEDICARE

## 2020-12-16 ENCOUNTER — Ambulatory Visit: Payer: Self-pay | Admitting: Adult Health

## 2020-12-16 DIAGNOSIS — Z95818 Presence of other cardiac implants and grafts: Principal | ICD-10-CM

## 2020-12-16 DIAGNOSIS — C61 Malignant neoplasm of prostate: Principal | ICD-10-CM

## 2020-12-16 DIAGNOSIS — C7951 Secondary malignant neoplasm of bone: Principal | ICD-10-CM

## 2020-12-16 DIAGNOSIS — M899 Disorder of bone, unspecified: Principal | ICD-10-CM

## 2020-12-19 ENCOUNTER — Ambulatory Visit: Admit: 2020-12-19 | Discharge: 2020-12-20 | Payer: MEDICARE

## 2020-12-19 ENCOUNTER — Ambulatory Visit: Admit: 2020-12-19 | Discharge: 2020-12-20 | Payer: MEDICARE | Attending: Internal Medicine | Primary: Internal Medicine

## 2020-12-19 DIAGNOSIS — R569 Unspecified convulsions: Principal | ICD-10-CM

## 2020-12-19 DIAGNOSIS — R531 Weakness: Principal | ICD-10-CM

## 2020-12-19 DIAGNOSIS — C61 Malignant neoplasm of prostate: Principal | ICD-10-CM

## 2020-12-19 DIAGNOSIS — C7951 Secondary malignant neoplasm of bone: Principal | ICD-10-CM

## 2020-12-19 DIAGNOSIS — I129 Hypertensive chronic kidney disease with stage 1 through stage 4 chronic kidney disease, or unspecified chronic kidney disease: Principal | ICD-10-CM

## 2020-12-19 DIAGNOSIS — I739 Peripheral vascular disease, unspecified: Principal | ICD-10-CM

## 2020-12-19 DIAGNOSIS — N401 Enlarged prostate with lower urinary tract symptoms: Principal | ICD-10-CM

## 2020-12-19 DIAGNOSIS — Z803 Family history of malignant neoplasm of breast: Principal | ICD-10-CM

## 2020-12-19 DIAGNOSIS — M899 Disorder of bone, unspecified: Principal | ICD-10-CM

## 2020-12-19 DIAGNOSIS — I2510 Atherosclerotic heart disease of native coronary artery without angina pectoris: Principal | ICD-10-CM

## 2020-12-19 DIAGNOSIS — R590 Localized enlarged lymph nodes: Principal | ICD-10-CM

## 2020-12-19 DIAGNOSIS — N183 Chronic kidney disease, stage 3 unspecified: Principal | ICD-10-CM

## 2020-12-19 DIAGNOSIS — R5383 Other fatigue: Principal | ICD-10-CM

## 2020-12-19 DIAGNOSIS — Z79818 Long term (current) use of other agents affecting estrogen receptors and estrogen levels: Principal | ICD-10-CM

## 2020-12-19 DIAGNOSIS — N139 Obstructive and reflux uropathy, unspecified: Principal | ICD-10-CM

## 2020-12-19 DIAGNOSIS — R972 Elevated prostate specific antigen [PSA]: Principal | ICD-10-CM

## 2020-12-19 MED ORDER — OXYCODONE 5 MG TABLET
ORAL_TABLET | ORAL | 0 refills | 5.00000 days | Status: CP | PRN
Start: 2020-12-19 — End: 2020-12-26

## 2020-12-20 MED ORDER — ENZALUTAMIDE 80 MG TABLET
ORAL_TABLET | Freq: Every day | ORAL | 5 refills | 30.00000 days | Status: CP
Start: 2020-12-20 — End: 2021-02-05
  Filled 2021-01-06: qty 60, 30d supply, fill #0

## 2020-12-20 MED ORDER — RELUGOLIX 120 MG TABLET
ORAL_TABLET | Freq: Every day | ORAL | 5 refills | 30 days | Status: CP
Start: 2020-12-20 — End: ?
  Filled 2021-01-06: qty 30, 30d supply, fill #0

## 2020-12-21 DIAGNOSIS — C7951 Secondary malignant neoplasm of bone: Principal | ICD-10-CM

## 2020-12-21 DIAGNOSIS — C61 Malignant neoplasm of prostate: Principal | ICD-10-CM

## 2020-12-24 ENCOUNTER — Encounter: Payer: Self-pay | Admitting: Nurse Practitioner

## 2020-12-26 ENCOUNTER — Other Ambulatory Visit: Payer: Self-pay

## 2020-12-26 ENCOUNTER — Ambulatory Visit (INDEPENDENT_AMBULATORY_CARE_PROVIDER_SITE_OTHER): Payer: Medicare Other | Admitting: Nurse Practitioner

## 2020-12-26 ENCOUNTER — Encounter: Payer: Self-pay | Admitting: Nurse Practitioner

## 2020-12-26 VITALS — BP 124/69 | HR 69 | Temp 97.5°F | Ht 69.09 in | Wt 155.0 lb

## 2020-12-26 DIAGNOSIS — Z7689 Persons encountering health services in other specified circumstances: Secondary | ICD-10-CM | POA: Diagnosis not present

## 2020-12-26 DIAGNOSIS — I10 Essential (primary) hypertension: Secondary | ICD-10-CM

## 2020-12-26 DIAGNOSIS — F17210 Nicotine dependence, cigarettes, uncomplicated: Secondary | ICD-10-CM

## 2020-12-26 DIAGNOSIS — I480 Paroxysmal atrial fibrillation: Secondary | ICD-10-CM

## 2020-12-26 DIAGNOSIS — C61 Malignant neoplasm of prostate: Secondary | ICD-10-CM | POA: Diagnosis not present

## 2020-12-26 DIAGNOSIS — I739 Peripheral vascular disease, unspecified: Secondary | ICD-10-CM

## 2020-12-26 DIAGNOSIS — E785 Hyperlipidemia, unspecified: Secondary | ICD-10-CM

## 2020-12-26 DIAGNOSIS — R7303 Prediabetes: Secondary | ICD-10-CM

## 2020-12-26 DIAGNOSIS — I2510 Atherosclerotic heart disease of native coronary artery without angina pectoris: Secondary | ICD-10-CM

## 2020-12-26 DIAGNOSIS — K279 Peptic ulcer, site unspecified, unspecified as acute or chronic, without hemorrhage or perforation: Secondary | ICD-10-CM

## 2020-12-26 DIAGNOSIS — C7951 Secondary malignant neoplasm of bone: Secondary | ICD-10-CM

## 2020-12-26 MED ORDER — OXYCODONE 5 MG TABLET
ORAL_TABLET | ORAL | 0 refills | 10.00000 days | Status: CP | PRN
Start: 2020-12-26 — End: 2020-12-26

## 2020-12-26 MED ORDER — ROSUVASTATIN 5 MG TABLET
ORAL_TABLET | Freq: Every evening | ORAL | 11 refills | 30.00000 days | Status: CP
Start: 2020-12-26 — End: 2021-12-26

## 2020-12-26 MED ORDER — LISINOPRIL 40 MG TABLET
ORAL_TABLET | Freq: Every day | ORAL | 11 refills | 30.00000 days | Status: CP
Start: 2020-12-26 — End: 2020-12-27

## 2020-12-26 NOTE — Assessment & Plan Note (Signed)
Chronic, ongoing with no recent CP.  Followed by cardiologist in [place], has been followed by them 25 years.  Continue this collaboration and medication regimen as prescribed by him.

## 2020-12-26 NOTE — Assessment & Plan Note (Signed)
Chronic, ongoing as evidenced by decreased hair pattern BLE, edema, and diminished pulses.  Recommend compression hose on during day and off at night, this will also benefit BP.  Continue ASA daily.  Monitor closely for wounds or skin breakdown.

## 2020-12-26 NOTE — Assessment & Plan Note (Signed)
History of T2DM, which improved with gastric bypass surgery in past.  Recent glucose on labs was 108 at [facility].  Continue to monitor and plan on A1c check at next visit.  Return in 6 months.

## 2020-12-26 NOTE — Assessment & Plan Note (Signed)
Ongoing with occasional flares, recent on 12/02/20, continue Omeprazole fir GI protection and monitor closely.  If flares treat accordingly.

## 2020-12-26 NOTE — Assessment & Plan Note (Addendum)
Quit last week, had smoked for 60 years and reports obtaining lung CT CA screening in past.  Currently monitored by oncology [facility] for prostate cancer, continue this collaboration.  Recommend continued cessation and take over lung CT screening if not being obtained at [facility].

## 2020-12-26 NOTE — Assessment & Plan Note (Addendum)
New diagnosis over past month, being followed by [facility] oncology.  Continue this collaboration and treatment regimen as prescribed by them.  Discussed with his wife and him possible palliative referral locally, they will think about this and alert provider if interested.  This may benefit overall pain management.  Recent labs with oncology reviewed.

## 2020-12-26 NOTE — Assessment & Plan Note (Signed)
Chronic, ongoing with BP at goal today.  Continue current medication regimen and adjust as needed.  Has orthostatic BP issues at times, recommend he wear compression at home daily -- on during day and off at night.  Recommend he monitor BP at least a few mornings a week at home and document.  DASH diet at home.  Labs up to date on review [facility] chart.  Return in 6 months.

## 2020-12-26 NOTE — Progress Notes (Addendum)
New Patient Office Visit  Subjective:  Patient ID: Anthony Figueroa, male    DOB: May 07, 1946  Age: 75 y.o. MRN: [id number]  CC:  Chief Complaint  Patient presents with  . 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  Introduced to Designer, jewellery role and practice setting.  All questions answered.  Discussed provider/patient relationship and expectations.  Is familiar with NP role as previous provider in [place] was NP, [employer].  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  At that visit they recommended his Lipitor be changed to Crestor, Losartan to Lisinopril and d/c Amlodipine due to CYP3A4 interactions with new chemo medication started -- he reports they plan to perform these changes at his next visit. Is taking Oxycodone for pain with mets, prescribed by oncology -- he reports this barely nudged it, so they called them to adjust.  Gleason score 9, PSA was 787 -- recent PSA >1500.    Does endorse increased fatigue.  No family history prostate ca.  Has history of PUD and this recent episode of hospital admission 12/02/20 was second occurrence of bleeding ulcers. Duration: new diagnosis Nocturia: 5+ times per night Urinary frequency:yes Incomplete voiding: yes Urgency: yes Weak urinary stream: yes Straining to start stream: yes Dysuria: no Severity: moderate Alleviating factors: nothing  HYPERTENSION / HYPERLIPIDEMIA Continues on Amlodipine 2.5 MG, Losartan 100 MG, Lasix 40 MG daily, and no current statin.  At recent oncology visit they recommended his Lipitor be changed to Crestor, Losartan to Lisinopril and d/c Amlodipine due to CYP3A4 interactions with new chemo medication started.  He reports some orthostatic BP changes -- has PT that comes to house and checks. Does endorse not taking  good fluid intake at home.  Last TSH on 11/24/20 was 4.032.  Quit smoking last week, had smoked for 60 years -- he does report going for lung cancer screening in past.  He has loop monitor in at this time due to history of atrial fibrillation.  History of multiple heart surgeries -- has followed current cardiologist for 25 years. Satisfied with current treatment? yes Duration of hypertension: chronic BP monitoring frequency: rarely BP range:  BP medication side effects: no Duration of hyperlipidemia: chronic Cholesterol medication side effects: no Cholesterol supplements: none Aspirin: no Recent stressors: no Recurrent headaches: no Visual changes: no Palpitations: no Dyspnea: no Chest pain: no Lower extremity edema: no Dizzy/lightheaded: no   PREDIABETES  No current medications, diet controlled. Last labs on 12/19/20 noted CRT 1.02, GFR 72, K+ 4.1, H/H 12.9, 40.9, MVC 91.5.  Glucose was 108.  Had gastric bypass surgery which improved the prediabetes. Polydipsia/polyuria: no Visual disturbance: no Chest pain: no Paresthesias: no  Past Medical History:  Diagnosis Date  . Allergy   . Anxiety   . Arthritis   . Bleeding acute gastric ulcer   . Blood transfusion without reported diagnosis   . Cataract   . CHF (congestive heart failure) ([place])   . Depression   . Heart murmur   . Hernia, inguinal   . Myocardial infarction ([place])   . Prostate cancer metastatic to bone ([place])   . Renal disorder   . Sleep apnea     Past Surgical History:  Procedure Laterality Date  . CHOLECYSTECTOMY    . CORONARY ARTERY BYPASS GRAFT    .  EYE SURGERY    . Gastric by pass    . GASTRIC BYPASS      Family History  Problem Relation Age of Onset  . Cancer Mother   . Stroke Mother   . Heart disease Father   . Cancer Sister   . Heart disease Brother   . Hypertension Son   . Heart disease Brother   . Liver disease Brother   . Heart disease Brother   . Hypertension Son     Social  History   Socioeconomic History  . Marital status: Married    Spouse name: Not on file  . Number of children: Not on file  . Years of education: Not on file  . Highest education level: Not on file  Occupational History  . Not on file  Tobacco Use  . Smoking status: Former Smoker    Packs/day: 0.10    Types: Cigarettes    Quit date: 12/19/2020    Years since quitting: 0.0  . Smokeless tobacco: Never Used  Vaping Use  . Vaping Use: Never used  Substance and Sexual Activity  . Alcohol use: Not Currently  . Drug use: Never  . Sexual activity: Not on file  Other Topics Concern  . Not on file  Social History Narrative  . Not on file   Social Determinants of Health   Financial Resource Strain: Not on file  Food Insecurity: Not on file  Transportation Needs: Not on file  Physical Activity: Not on file  Stress: Not on file  Social Connections: Not on file  Intimate Partner Violence: Not on file    ROS Review of Systems  Constitutional: Negative for activity change, diaphoresis, fatigue and fever.  Respiratory: Negative for cough, chest tightness, shortness of breath and wheezing.   Cardiovascular: Negative for chest pain, palpitations and leg swelling.  Gastrointestinal: Negative.   Endocrine: Negative for cold intolerance, heat intolerance, polydipsia, polyphagia and polyuria.  Musculoskeletal: Negative.   Skin: Negative.   Neurological: Negative.   Psychiatric/Behavioral: Negative.     Objective:   Today's Vitals: BP 124/69   Pulse 69   Temp (!) 97.5 F (36.4 C) (Oral)   Ht 5' 9.09" (1.755 m)   Wt 155 lb (70.3 kg)   SpO2 96%   BMI 22.83 kg/m   Physical Exam Vitals and nursing note reviewed.  Constitutional:      General: He is awake. He is not in acute distress.    Appearance: He is well-developed, well-groomed and underweight. He is not ill-appearing or toxic-appearing.  HENT:     Head: Normocephalic and atraumatic.     Right Ear: Hearing normal. No  drainage.     Left Ear: Hearing normal. No drainage.     Mouth/Throat:     Pharynx: Uvula midline.  Eyes:     General: Lids are normal.        Right eye: No discharge.        Left eye: No discharge.     Conjunctiva/sclera: Conjunctivae normal.     Pupils: Pupils are equal, round, and reactive to light.  Neck:     Thyroid: No thyromegaly.     Vascular: No carotid bruit or JVD.     Trachea: Trachea normal.  Cardiovascular:     Rate and Rhythm: Normal rate and regular rhythm.     Heart sounds: Normal heart sounds, S1 normal and S2 normal. No murmur heard. No gallop.      Comments: Decreased hair pattern bilateral  lower extremities. Pulmonary:     Effort: Pulmonary effort is normal.     Breath sounds: Normal breath sounds.  Abdominal:     General: Bowel sounds are normal.     Palpations: Abdomen is soft.  Musculoskeletal:        General: Normal range of motion.     Cervical back: Normal range of motion and neck supple.     Right lower leg: 2+ Edema present.     Left lower leg: 2+ Edema present.  Skin:    General: Skin is warm and dry.     Capillary Refill: Capillary refill takes less than 2 seconds.  Neurological:     Mental Status: He is alert and oriented to person, place, and time.     Deep Tendon Reflexes: Reflexes are normal and symmetric.     Reflex Scores:      Brachioradialis reflexes are 2+ on the right side and 2+ on the left side.      Patellar reflexes are 2+ on the right side and 2+ on the left side. Psychiatric:        Attention and Perception: Attention normal.        Mood and Affect: Mood normal.        Speech: Speech normal.        Behavior: Behavior normal. Behavior is cooperative.        Thought Content: Thought content normal.     Assessment & Plan:   Problem List Items Addressed This Visit      Cardiovascular and Mediastinum   Coronary artery disease involving native coronary artery of native heart without angina pectoris    Chronic, ongoing with  no recent CP.  Followed by cardiologist in [place], has been followed by them 25 years.  Continue this collaboration and medication regimen as prescribed by him.      Relevant Medications   amLODipine (NORVASC) 2.5 MG tablet   aspirin 81 MG EC tablet   furosemide (LASIX) 40 MG tablet   losartan (COZAAR) 100 MG tablet   Essential hypertension    Chronic, ongoing with BP at goal today.  Continue current medication regimen and adjust as needed.  Has orthostatic BP issues at times, recommend he wear compression at home daily -- on during day and off at night.  Recommend he monitor BP at least a few mornings a week at home and document.  DASH diet at home.  Labs up to date on review [facility] chart.  Return in 6 months.        Relevant Medications   amLODipine (NORVASC) 2.5 MG tablet   aspirin 81 MG EC tablet   furosemide (LASIX) 40 MG tablet   losartan (COZAAR) 100 MG tablet   Paroxysmal atrial fibrillation (HCC)    Followed by cardiology for past flutter pattern and has loop recorder in place.  Continue collaboration with cardiology and medications as prescribed by them.      Relevant Medications   amLODipine (NORVASC) 2.5 MG tablet   aspirin 81 MG EC tablet   furosemide (LASIX) 40 MG tablet   losartan (COZAAR) 100 MG tablet   PVD (peripheral vascular disease) (HCC)    Chronic, ongoing as evidenced by decreased hair pattern BLE, edema, and diminished pulses.  Recommend compression hose on during day and off at night, this will also benefit BP.  Continue ASA daily.  Monitor closely for wounds or skin breakdown.      Relevant Medications   amLODipine (NORVASC) 2.5  MG tablet   aspirin 81 MG EC tablet   furosemide (LASIX) 40 MG tablet   losartan (COZAAR) 100 MG tablet     Digestive   PUD (peptic ulcer disease)    Ongoing with occasional flares, recent on 12/02/20, continue Omeprazole fir GI protection and monitor closely.  If flares treat accordingly.        Musculoskeletal and Integument    Prostate cancer metastatic to bone [facility])    New diagnosis over past month, being followed by [facility] oncology.  Continue this collaboration and treatment regimen as prescribed by them.  Discussed with his wife and him possible palliative referral locally, they will think about this and alert provider if interested.  This may benefit overall pain management.  Recent labs with oncology reviewed.      Relevant Medications   aspirin 81 MG EC tablet   enzalutamide (XTANDI) 80 MG tablet   Relugolix 120 MG TABS     Other   Cigarette smoker    Quit last week, had smoked for 60 years and reports obtaining lung CT CA screening in past.  Currently monitored by oncology [facility] for prostate cancer, continue this collaboration.  Recommend continued cessation and take over lung CT screening if not being obtained at [facility].      Hyperlipidemia LDL goal <70    Chronic, ongoing.  Currently off statin due to interactions with chemo regimen for prostate CA.  Will maintain off at this time and plan on lipid panel check next visit.  Restart statin when able due to cardiac history.  Return in 6 months.      Relevant Medications   amLODipine (NORVASC) 2.5 MG tablet   aspirin 81 MG EC tablet   furosemide (LASIX) 40 MG tablet   losartan (COZAAR) 100 MG tablet   Prediabetes    History of T2DM, which improved with gastric bypass surgery in past.  Recent glucose on labs was 108 at [facility].  Continue to monitor and plan on A1c check at next visit.  Return in 6 months.       Other Visit Diagnoses    Encounter to establish care    -  Primary      Outpatient Encounter Medications as of 12/26/2020  Medication Sig  . acetaminophen (TYLENOL) 500 MG tablet Take by mouth.  Marland Kitchen amLODipine (NORVASC) 2.5 MG tablet Take by mouth.  Marland Kitchen aspirin 81 MG EC tablet Take by mouth.  . enzalutamide (XTANDI) 80 MG tablet Take by mouth.  . ergocalciferol (VITAMIN D2) 1.25 MG (50000 UT) capsule Take by mouth.  . furosemide (LASIX) 40 MG tablet  Take by mouth.  . losartan (COZAAR) 100 MG tablet Take 100 mg by mouth daily.  . methylphenidate 27 MG PO CR tablet Take 27 mg by mouth every morning.  Marland Kitchen omeprazole (PRILOSEC) 40 MG capsule Take by mouth.  . oxyCODONE (OXY IR/ROXICODONE) 5 MG immediate release tablet Take by mouth.  . Relugolix 120 MG TABS Take by mouth.  . traZODone (DESYREL) 50 MG tablet Take by mouth.   No facility-administered encounter medications on file as of 12/26/2020.    Follow-up: Return in about 6 months (around 06/25/2021) for Prostate CA, HTN/HLD, PREDIABETES.   Venita Lick, NP

## 2020-12-26 NOTE — Assessment & Plan Note (Addendum)
Chronic, ongoing.  Currently off statin due to interactions with chemo regimen for prostate CA.  Will maintain off at this time and plan on lipid panel check next visit.  Restart statin when able due to cardiac history.  Return in 6 months.

## 2020-12-26 NOTE — Assessment & Plan Note (Signed)
Followed by cardiology for past flutter pattern and has loop recorder in place.  Continue collaboration with cardiology and medications as prescribed by them.

## 2020-12-26 NOTE — Patient Instructions (Signed)
Prostate Cancer  The prostate is a male gland that helps make semen. It is located below a man's bladder, in front of the rectum. Prostate cancer is when abnormal cells grow in this gland. What are the causes? The cause of this condition is not known. What increases the risk? You are more likely to develop this condition if:  You are 76 years of age or older.  You are African American.  You have a family history of prostate cancer.  You have a family history of breast cancer. What are the signs or symptoms? Symptoms of this condition include:  A need to pee often.  Peeing that is weak, or pee that stops and starts.  Trouble starting or stopping your pee.  Inability to pee.  Blood in your pee or semen.  Pain in the lower back, lower belly (abdomen), hips, or upper thighs.  Trouble getting an erection.  Trouble emptying all of your pee. How is this treated? Treatment for this condition depends on your age, your health, the kind of treatment you like, and how far the cancer has spread. Treatments include:  Being watched. This is called observation. You will be tested from time to time, but you will not get treated. Tests are to make sure that the cancer is not growing.  Surgery. This may be done to remove the prostate, to remove the testicles, or to freeze or kill cancer cells.  Radiation. This uses a strong beam to kill cancer cells.  Ultrasound energy. This uses strong sound waves to kill cancer cells.  Chemotherapy. This uses medicines that stop cancer cells from increasing. This kills cancer cells and healthy cells.  Targeted therapy. This kills cancer cells only. Healthy cells are not affected.  Hormone treatment. This stops the body from making hormones that help the cancer cells to grow. Follow these instructions at home:  Take over-the-counter and prescription medicines only as told by your doctor.  Eat a healthy diet.  Get plenty of sleep.  Ask your  doctor for help to find a support group for men with prostate cancer.  If you have to go to the hospital, let your cancer doctor (oncologist) know.  Treatment may affect your ability to have sex. Touch, hold, hug, and caress your partner to have intimate moments.  Keep all follow-up visits as told by your doctor. This is important. Contact a doctor if:  You have new or more trouble peeing.  You have new or more blood in your pee.  You have new or more pain in your hips, back, or chest. Get help right away if:  You have weakness in your legs.  You lose feeling in your legs.  You cannot control your pee or your poop (stool).  You have chills or a fever. Summary  The prostate is a male gland that helps make semen.  Prostate cancer is when abnormal cells grow in this gland.  Treatment includes doing surgery, using medicines, using very strong beams, or watching without treatment.  Ask your doctor for help to find a support group for men with prostate cancer.  Contact a doctor if you have problems peeing or have any new pain that you did not have before. This information is not intended to replace advice given to you by your health care provider. Make sure you discuss any questions you have with your health care provider. Document Revised: 10/27/2019 Document Reviewed: 10/27/2019 Elsevier Patient Education  2021 [employer]vier Inc.  

## 2020-12-27 MED ORDER — OXYCODONE 5 MG TABLET
ORAL_TABLET | ORAL | 0 refills | 10.00000 days | Status: CP | PRN
Start: 2020-12-27 — End: 2021-01-16

## 2020-12-27 MED ORDER — ROSUVASTATIN 5 MG TABLET
ORAL_TABLET | Freq: Every evening | ORAL | 11 refills | 30 days | Status: CP
Start: 2020-12-27 — End: 2021-12-27

## 2020-12-27 MED ORDER — LISINOPRIL 40 MG TABLET
ORAL_TABLET | Freq: Every day | ORAL | 11 refills | 30.00000 days | Status: CP
Start: 2020-12-27 — End: 2021-12-27

## 2020-12-28 DIAGNOSIS — G893 Neoplasm related pain (acute) (chronic): Principal | ICD-10-CM

## 2020-12-30 NOTE — Unmapped (Signed)
[facility] Specialty Medication Onboarding    Specialty Medication: Orgovyx 120mg  tablets  Prior Authorization: Approved   Financial Assistance: Yes - copay card approved as secondary   Final Copay/Day Supply: $0 / 30 days    Insurance Restrictions: None     Notes to Pharmacist:     The triage team has completed the benefits investigation and has determined that the patient is able to fill this medication at [facility]. Please contact the patient to complete the onboarding or follow up with the prescribing physician as needed.

## 2021-01-03 NOTE — Unmapped (Signed)
[facility]   Patient Onboarding/Medication Counseling    Orgovyx start date ~01/17/21; Xtandi start once med received    Douglas Gray is a 75 y.o. male with Prostate cancer metastatic to bone who I am counseling today on initiation of therapy.  I am speaking to the patient.    Was a Nurse, learning disability used for this call? No    Verified patient's date of birth / HIPAA.    Specialty medication(s) to be sent: Hematology/Oncology: Orgovyx    Non-specialty medications/supplies to be sent: none    Medications not needed at this time: none     Orgovyx (relugolix)    Medication & Administration     Dosage: Take 1 tablet (120 mg total) daily    Administration:   ??? May be administered with or without food.  ??? Take at the same time every day.  ??? Swallow tablets whole. Do not crush or chew tablets.    Adherence/Missed dose instructions:   ??? Take a missed dose as soon as you think about.  ??? If it is less than 12 hours until the next dose, skip the missed dose and go back to your normal time.   ??? Do not take 2 doses at the same time or extra doses.   ??? If treatment is stopped for more than 7 days loading dose may need to be repeated (360 mg = 3 tablets)    Goals of Therapy     ??? To prevent cancer progression    Side Effects & Monitoring Parameters     Commonly reported side effects  ??? Fatigue or loss of strength/energy (26%)  ??? Hot flashes (54%)  ??? Muscle and joint pain (30%)   ??? Possible increase in Triglycerides (35%)  ??? May increase blood sugar levels (44%)  ??? Constipation or Diarrhea (12%)  ??? Trouble sleeping (less than 10%)  ??? Weight gain (less than 10%)  ??? Sexual dysfunction (less than 10%)  ??? Enlarged, tender or sore breasts (less than 10%)  ??? Testicle changes (less than 10%)    The following side effects should be reported to the provider:  ??? High blood sugar (confusion, feeling sleepy, increased thirst/hunger/urination, flushing, fast breathing, or fruity breath) (44%)  ??? Problems with liver (yellowing of the skin or eyes, darkening of urine, or abdominal pain) (18-27%)  ??? Chest pain or pressure, a fast heartbeat, or an abnormal heartbeat. (3%)  ??? Any abnormal heartbeat or signs of fluid and electrolyte problems (mood changes, confusion, muscle pain or weakness, abnormal/fast heartbeat, severe dizziness, passing out)  ??? Signs or symptoms suggestive of a stroke (change in strength on one side being greater than the other, trouble speaking/thinking, change in balance, or vision changes)  ??? Behavior/mood changes (acting aggressive, crying, depression, emotional ups and downs, restlessness, and feeling angry or irritable)  ??? Signs of anaphylaxis (wheezing, chest tightness, swelling of face, lips, tongue or throat)    Monitoring Parameters:  ??? Baseline PSA and testosterone levels and as clinically appropriate  ??? EKG and electrolytes as clinically appropriate  ??? Blood glucose and serum triglycerides periodically during therapy  ??? Hemoglobin and liver functions tests periodically during therapy  ??? Monitor adherence    Contraindications, Warnings, & Precautions     ??? QT prolongation: Androgen deprivation therapy (achieved by relugolix) may prolong QT/QTc interval.   ??? Cardiovascular risk: Androgen deprivation therapy may increase cardiovascular risk, especially in those with history of cardiovascular events. While relugolix is associated with 54%  less risk of major adverse cardiovascular events compared to leuprolide, the risk still exists (Shores et al. Blan[place] Dessert Study. NEJM 2020).  ??? Insulin resistance: Androgen deprivation therapy may contribute to insulin resistance and diabetes.  ??? Reproductive Considerations: Relugolix may impact male fertility.   o Males with male partners of reproductive potential should use effective contraception during therapy and for 2 weeks after the last relugolix dose.    Drug/Food Interactions     ??? Medication list reviewed in Epic. The patient was instructed to inform the care team before taking any new medications or supplements. No drug interactions identified.     Storage, Handling Precautions, & Disposal     ??? Store at room temperature in the original container (do not use a pillbox or store with other medications). Do not take out of the anti-moisture cube or packet.   ??? Store in a dry place.  Do not store in a bathroom.    ??? Caregivers helping administer medication should wear gloves and wash hands immediately after. Caregivers who are pregnant or breastfeeding should not prepare doses.   ??? Keep the lid tightly closed. Keep out of the reach of children and pets.  ??? Do not flush down a toilet or pour down a drain unless instructed to do so.  Check with your local police department or fire station about drug take-back programs in your area    Xtandi (Enzalutamide)    Medication & Administration     Dosage: Take 2 tablets (160 mg total) by mouth daily. Swallow tablets whole. Do not cut, crush, or chew the tablets    Administration:   ??? Take at same time each day  ??? Take with or without food  ??? Take with calcium and vitamin D  ??? Swallow capsules whole; do not chew, dissolve, or open the capsules.    Adherence/Missed dose instructions:  ??? Take a missed dose as soon as you think about it.  ??? If you do not think about the missed dose until the next day, skip the missed dose and go back to your normal time.  ??? Do not take 2 doses at the same time or extra doses    Goals of Therapy     ??? Prevent disease progression    Side Effects & Monitoring Parameters     ??? Common side effects  ??? Feeling dizzy, tired, or weak.  ??? Back, muscle, or joint pain.  ??? Muscle weakness.  ??? Not hungry.  ??? Diarrhea or constipation.  ??? Hot flashes.  ??? Signs of a common cold.  ??? Weight loss.  ??? Headache.  ??? Trouble sleeping.  ??? Anxiety.  ??? Change in taste    ??? The following side effects should be reported to the provider:  ??? Allergic reaction  ??? Signs of high blood pressure (very bad headache or dizziness, passing out, or change in eyesight)  ??? Signs of electrolyte problems (mood changes, confusion, muscle pain or weakness, abnormal heartbeat, seizures, not hungry, or very bad upset stomach or throwing up)  ??? Chest pain or pressure or shortness of breath  ??? Any abnormal burning, numbness, or tingling feeling  ??? Blood in the urine.  ??? Seizures  ??? Swelling in the arms or legs  ??? Bone pain after a fall  ??? Memory problems or loss or not able to focus  ??? Signs of infection   ??? Signs of a brain problem called posterior reversible encephalopathy syndrome (PRES) (feeling confused, lowered  alertness, change in eyesight, loss of eyesight, seizures, or very bad headache)    ??? Monitoring Parameters:   ??? Monitor blood pressure    Contraindications, Warnings, & Precautions     ??? Male patients with male partners of reproductive potential should use effective contraception during treatment and for 3 months after the last dose  ??? Cardiovascular events, specifically ischemic heart disease  ??? Fractures - evaluate fall and fracture risk  ??? Posterior reversible encephalopathy syndrome - monitor for headache, seizure, confusion, and visual changes  ??? Seizures     Drug/Food Interactions     ??? Medication list reviewed in Epic. The patient was instructed to inform the care team before taking any new medications or supplements. 1. Enzalutamide (Strong CYP3A4 Inducer) may decrease the serum concentration of TraZODone - may need therapy modification/dose increase trazodone.  2. 1. Enzalutamide (Strong CYP3A4 Inducer) may decrease the serum concentration of citalopram and oxycodone - monitor therapy.  3. Enzalutamide (Moderate CYP2C19 Inducer) may decrease the serum concentration of Omeprazole - monitor therapy.     Storage, Handling Precautions, & Disposal     ??? Store at room temperature in a dry place  ??? Keep in a safe place away from children and pets  ??? This drug is considered hazardous and should be handled as little as possible.  If someone else helps with medication administration, they should wear gloves.    Current Medications (including OTC/herbals), Comorbidities and Allergies     Current Outpatient Medications   Medication Sig Dispense Refill   ??? buPROPion (WELLBUTRIN XL) 150 MG 24 hr tablet Take 150 mg by mouth daily.     ??? citalopram (CELEXA) 20 MG tablet Take 20 mg by mouth daily.      ??? enzalutamide (XTANDI) 80 mg tablet Take 2 tablets (160 mg total) by mouth daily. Swallow tablets whole. Do not cut, crush, or chew the tablets. 60 tablet 5   ??? ergocalciferol-1,250 mcg, 50,000 unit, (VITAMIN D2-1,250 MCG, 50,000 UNIT,) 1,250 mcg (50,000 unit) capsule Take 1,250 mcg by mouth once a week.     ??? furosemide (LASIX) 40 MG tablet Take 40 mg by mouth daily as needed.      ??? lisinopriL (PRINIVIL,ZESTRIL) 40 MG tablet Take 1 tablet by mouth daily. 30 tablet 11   ??? methylphenidate HCl (CONCERTA) 27 MG CR tablet Take 27 mg by mouth.     ??? omeprazole (PRILOSEC) 40 MG capsule Take 1 capsule (40 mg total) by mouth two (2) times a day. OPEN capsule and ingest contents of capsule only. After 8 weeks of twice daily dosing, switch to once daily dosing. 60 capsule 1   ??? oxyCODONE (ROXICODONE) 5 MG immediate release tablet Take 1 to 2 tablets by mouth every four hours as needed for pain. 120 tablet 0   ??? relugolix (ORGOVYX) 120 mg tablet Take 1 tablet (120 mg total) by mouth daily. Take with or without food. Swallow tablets whole. Do not crush or chew tablet. 30 tablet 5   ??? rosuvastatin (CRESTOR) 5 MG tablet Take 1 tablet (5 mg total) by mouth nightly. 30 tablet 11   ??? traZODone (DESYREL) 50 MG tablet Take 150 mg by mouth nightly.   2     No current facility-administered medications for this visit.       Allergies   Allergen Reactions   ??? Metformin Swelling   ??? Mirtazapine Anxiety     Memory confusion   ??? Janumet [Sitagliptin-Metformin]    ??? Indomethacin  Anxiety     (INDOCIN)         Patient Active Problem List   Diagnosis   ??? Coronary artery disease involving native coronary artery of native heart without angina pectoris   ??? Valvular heart disease   ??? Nonrheumatic mitral valve regurgitation   ??? Nonrheumatic tricuspid valve regurgitation   ??? Status post coronary artery stent placement   ??? Hx of CABG   ??? Status post aortic valve replacement with bioprosthetic valve   ??? PVD (peripheral vascular disease) (CMS-HCC)   ??? Hyperlipidemia LDL goal <70   ??? Bilateral carotid artery stenosis   ??? Type 2 diabetes mellitus with stage 3 chronic kidney disease, without long-term current use of insulin (CMS-HCC)   ??? Stage 3 chronic kidney disease (CMS-HCC)   ??? Cigarette smoker   ??? Personal history of nicotine dependence   ??? Syncope   ??? Unstable balance   ??? Discomfort of back   ??? Intermittent palpitations   ??? Paroxysmal atrial fibrillation (CMS-HCC)   ??? Wide-complex tachycardia (CMS-HCC)   ??? Abnormal nuclear stress test   ??? Bradycardia   ??? Dyspnea on effort   ??? Hx of falling   ??? Gastrointestinal hemorrhage with melena   ??? Bone metastases (CMS-HCC)   ??? Elevated PSA   ??? Prostate cancer metastatic to bone (CMS-HCC)     Reviewed and up to date in Epic.    Appropriateness of Therapy     Is medication and dose appropriate based on diagnosis? Yes    Prescription has been clinically reviewed: Yes    Baseline Quality of Life Assessment      How many days over the past month did your prostate cancer metastatic to bone  keep you from your normal activities? For example, brushing your teeth or getting up in the morning. 0    Financial Information     Medication Assistance provided: Prior Authorization and Copay Assistance    Anticipated copay of $0 / 30 days reviewed with patient. Verified delivery address.    Delivery Information     Scheduled delivery date: 01/07/21    Expected start date: Douglas Gray) 01/07/21 (Orgovyx) start date ~01/17/21 (about 1 month after Degarelix injection - last on 12/19/20)    Medication will be delivered via Next Day Courier to the prescription address in [facility].  This shipment will not require a signature.      Explained the services we provide at [facility] and that each month we would call to set up refills.  Stressed importance of returning phone calls so that we could ensure they receive their medications in time each month.  Informed patient that we should be setting up refills 7-10 days prior to when they will run out of medication.  A pharmacist will reach out to perform a clinical assessment periodically.  Informed patient that a welcome packet and a drug information handout will be sent.      Patient verbalized understanding of the above information as well as how to contact the pharmacy at [phone] option 4 with any questions/concerns.  The pharmacy is open Monday through Friday 8:30am-4:30pm.  A pharmacist is available 24/7 via pager to answer any clinical questions they may have.    Patient Specific Needs     - Does the patient have any physical, cognitive, or cultural barriers? No    - Patient prefers to have medications discussed with  patient and daughter     - Is the  patient or caregiver able to read and understand education materials at a high school level or above? Yes    - Patient's primary language is  English     - Is the patient high risk? Yes, patient is taking oral chemotherapy. Appropriateness of therapy as been assessed    - Does the patient require a Care Management Plan? No     - Does the patient require physician intervention or other additional services (i.e. nutrition, smoking cessation, social work)? No      Douglas Gray A [facility] Specialty Pharmacist

## 2021-01-03 NOTE — Unmapped (Signed)
Hi,    Patient Douglas Gray called requesting a medication refill for the following:    ??? Medication: Xtandi  ??? Dosage:   ??? Days left of medication:   ??? Pharmacy: [facility] [phone] or fax# [phone]    The expected turnaround time is 3-4 business days       Check Indicates criteria has been reviewed and confirmed with the patient:    [x]  Preferred Name   [x]  DOB and/or MR#  [x]  Preferred Contact Method  [x]  Phone Number(s)   [x]  Preferred Pharmacy   []  MyChart     Thank you,  Christell Faith  [facility]  [phone]

## 2021-01-05 NOTE — Unmapped (Signed)
LINQ REMOTE MONITORING ASSESSMENT    Date of Transmission:  December 15, 2020      Manufacturer of Device:    [employer]    Type of Device:    Implantable Loop Monitor    See scanned/downloaded PDF report for model numbers, serial numbers, and date(s) of implant.    Presenting Rhythm:       SR @ 58bpm    Heart Rate Variability:   Not Applicable    Patient Activity:    Patient Active n/a      Device Findings:    Please see downloaded PDF file of transmission under Media Tab  for full details of device interrogation to include, when applicable, battery status/charge time, lead trend data, and programmed parameters.    Battery status OK/stable  Huston Foley detected episodes; 31 brady episodes avg rate 40bpm  Pause detected episodes; 59 pause episodes appear to be inappropriate    Plan:    Continue Routine Remote Monitoring     Darlin Drop  [employer]  01/05/2021  2:59 PM

## 2021-01-05 NOTE — Unmapped (Signed)
Hi,    Patient Douglas Gray called requesting a medication refill for the following:    ??? Medication: Xtandi  ??? Dosage: 80 mg  ??? Days left of medication: 0  ??? Pharmacy: [facility]      The expected turnaround time is 3-4 business days     Thank you,  Kelli Hope  [facility]  [phone]

## 2021-01-05 NOTE — Unmapped (Signed)
I have reviewed the battery and overall device/lead status, diagnostic data including arrhythmic events on the remote device evaluation on [employer].    Electronically signed by    Bettye Boeck, MD, [facility]  Electrophysiology - Cardiovascular Diseases  [facility])  [place], [place]   [phone] (phone)  [phone] (fax)    Electronically signed 01/05/2021, 3:01 PM

## 2021-01-06 NOTE — Unmapped (Signed)
[facility] Specialty Medication Onboarding    Specialty Medication: Xtandi 80mg  tablet  Prior Authorization: Approved   Financial Assistance: Yes - copay card approved as secondary   Final Copay/Day Supply: $<25 / 30 days    Insurance Restrictions: None     Notes to Pharmacist:     The triage team has completed the benefits investigation and has determined that the patient is able to fill this medication at [facility]. Please contact the patient to complete the onboarding or follow up with the prescribing physician as needed.

## 2021-01-15 DIAGNOSIS — C61 Malignant neoplasm of prostate: Principal | ICD-10-CM

## 2021-01-15 DIAGNOSIS — C7951 Secondary malignant neoplasm of bone: Principal | ICD-10-CM

## 2021-01-16 ENCOUNTER — Other Ambulatory Visit: Admit: 2021-01-16 | Discharge: 2021-01-17 | Payer: MEDICARE

## 2021-01-16 ENCOUNTER — Ambulatory Visit: Admit: 2021-01-16 | Discharge: 2021-01-17 | Payer: MEDICARE

## 2021-01-16 ENCOUNTER — Ambulatory Visit: Admit: 2021-01-16 | Discharge: 2021-01-17 | Payer: MEDICARE | Attending: Internal Medicine | Primary: Internal Medicine

## 2021-01-16 ENCOUNTER — Institutional Professional Consult (permissible substitution): Admit: 2021-01-16 | Discharge: 2021-01-17 | Payer: MEDICARE

## 2021-01-16 DIAGNOSIS — C61 Malignant neoplasm of prostate: Principal | ICD-10-CM

## 2021-01-16 DIAGNOSIS — C7951 Secondary malignant neoplasm of bone: Principal | ICD-10-CM

## 2021-01-16 LAB — CBC W/ AUTO DIFF
BASOPHILS ABSOLUTE COUNT: 0 10*9/L (ref 0.0–0.1)
BASOPHILS RELATIVE PERCENT: 0.5 %
EOSINOPHILS ABSOLUTE COUNT: 0.1 10*9/L (ref 0.0–0.4)
EOSINOPHILS RELATIVE PERCENT: 0.9 %
HEMATOCRIT: 45.3 % (ref 41.0–53.0)
HEMOGLOBIN: 13.7 g/dL (ref 13.5–17.5)
LARGE UNSTAINED CELLS: 2 % (ref 0–4)
LYMPHOCYTES ABSOLUTE COUNT: 1 10*9/L — ABNORMAL LOW (ref 1.5–5.0)
LYMPHOCYTES RELATIVE PERCENT: 16.1 %
MEAN CORPUSCULAR HEMOGLOBIN CONC: 30.4 g/dL — ABNORMAL LOW (ref 31.0–37.0)
MEAN CORPUSCULAR HEMOGLOBIN: 29.4 pg (ref 26.0–34.0)
MEAN CORPUSCULAR VOLUME: 96.7 fL (ref 80.0–100.0)
MEAN PLATELET VOLUME: 9.3 fL (ref 7.0–10.0)
MONOCYTES ABSOLUTE COUNT: 0.3 10*9/L (ref 0.2–0.8)
MONOCYTES RELATIVE PERCENT: 5 %
NEUTROPHILS ABSOLUTE COUNT: 4.5 10*9/L (ref 2.0–7.5)
NEUTROPHILS RELATIVE PERCENT: 75.9 %
PLATELET COUNT: 162 10*9/L (ref 150–440)
RED BLOOD CELL COUNT: 4.68 10*12/L (ref 4.50–5.90)
RED CELL DISTRIBUTION WIDTH: 16.2 % — ABNORMAL HIGH (ref 12.0–15.0)
WBC ADJUSTED: 5.9 10*9/L (ref 4.5–11.0)

## 2021-01-16 LAB — COMPREHENSIVE METABOLIC PANEL
ALBUMIN: 2.5 g/dL — ABNORMAL LOW (ref 3.4–5.0)
ALKALINE PHOSPHATASE: 2646 U/L — ABNORMAL HIGH (ref 46–116)
ALT (SGPT): 10 U/L (ref 10–49)
ANION GAP: 3 mmol/L — ABNORMAL LOW (ref 5–14)
AST (SGOT): 15 U/L (ref <=34)
BILIRUBIN TOTAL: 0.4 mg/dL (ref 0.3–1.2)
BLOOD UREA NITROGEN: 25 mg/dL — ABNORMAL HIGH (ref 9–23)
BUN / CREAT RATIO: 19
CALCIUM: 8.3 mg/dL — ABNORMAL LOW (ref 8.7–10.4)
CHLORIDE: 112 mmol/L — ABNORMAL HIGH (ref 98–107)
CO2: 26 mmol/L (ref 20.0–31.0)
CREATININE: 1.35 mg/dL — ABNORMAL HIGH
EGFR CKD-EPI AA MALE: 59 mL/min/{1.73_m2} — ABNORMAL LOW (ref >=60)
EGFR CKD-EPI NON-AA MALE: 51 mL/min/{1.73_m2} — ABNORMAL LOW (ref >=60)
GLUCOSE RANDOM: 93 mg/dL (ref 70–179)
POTASSIUM: 4.9 mmol/L (ref 3.5–5.1)
PROTEIN TOTAL: 4.9 g/dL — ABNORMAL LOW (ref 5.7–8.2)
SODIUM: 141 mmol/L (ref 135–145)

## 2021-01-16 LAB — SLIDE REVIEW

## 2021-01-16 LAB — PSA: PROSTATE SPECIFIC ANTIGEN: 96.85 ng/mL — ABNORMAL HIGH (ref 0.00–4.00)

## 2021-01-16 MED ORDER — OXYCODONE 5 MG TABLET
ORAL_TABLET | ORAL | 0 refills | 10 days | Status: CP | PRN
Start: 2021-01-16 — End: ?

## 2021-01-16 NOTE — Unmapped (Signed)
Degarelix released from therapy plan but not given.

## 2021-01-16 NOTE — Unmapped (Addendum)
Genitourinary Oncology Clinic Note    Patient Name: Douglas Gray  Encounter Date: 01/16/2021    Referring Physician: Jennye Moccasin, MD  [address]  [address]  [place],  [place] [address]    Assessment/Plan:    75 y.o. 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 Now on ADT s/p degarelix 11/2020 and will changed to relugolix starting today. Again reviewed AEs associated with ADT - he has only mild fatigue and some cognitive fog. His urinary symptoms are improved.    We also discussed role for additional therapy and will start enzalutamide in 2 weeks (per [facility] and [facility]) after he feels comfortable with relugolix. Again reviewed AEs including fatigue, HTN (though less than with abi), mental slowness, falls, and rarely seizures.     We discussed the purpose, risks, and benefits of tumor genetic profiling and Avinash Maltos has agreed to the [employer] xT assay. Patient notified regarding completion of the financial assistance form. Patient also notified that associated de-identified genetic information will be retained by [employer] and may be used for other non-clinical purposes in the future.    We also discussed germline testing. - Ordered Misc DNA Sendout for custom [facility] prostate cancer panel, testing the following genes: BRCA1, BRCA2, ATM, BARD1, BRIP1, FANCA, CHEK2, HOXB13, MLH1, MSH2, MSH6, PMS2, EPCAM, PALB2, RAD51C, RAD51D, TP53   - Ordered E-consult to Adult Genetics--appreciate assistance with interpretation of results  - Patient educated on purpose, risks, benefits, and alternatives for genetic testing as well as e-consult. Specifically discussed price of genetic testing and e-consult; noted that we often leave genetic testing to Genetics, and if patient does not desire e-consult for streamlined genetic testing we will place an ambulatory referral.     Pain Management: Controlled on oxycodone and will refill today.    Lower Urinary Tract Symptoms: 2/2 prostatic urethra obstruction. Improved; may need RT in the future.    Bone Health: Dexa for baseline; consider zometa. Recommended calcium and vitamin D.     Plan:  - Start relugolix today  - Start enzalutamide in 2 weeks  - oxycodone 5mg  q6h PRN prescribed   - DEXA pending  - Discussed weight bearing exercise for cancer related fatigue  - Send [facility] testing  - Germline testing ordered  - RTC in 6 weeks with repeat PSA, check in    Decision-making today was high complexity on the basis of discussion of cancer, and weighing use of high risk hormonal therapy, toxicity and complications/comorbidity of met PCa.    ADDENDUM: Bone density low - will start yearly zoledronic acid    History of Present Illness:    Douglas Gray is a 75 y.o. 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     Briefly, patient presented to [facility] on 12/30 with complaint of melena for several days and weight loss. CT AP obtained in ED showed extensive sclerotic osseous metastatic disease throughout visualized axial and appendicular skeleton and enlarged L pelvic sidewall LN measuring 1.9cm. There is a questionable lesion in L apical prostate. PSA 787. Urology consulted who recommended prostate biopsy showing Gleason  5+4=9 in one core and 4+5=9 in two cores adenocarcinoma. 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 He was discharged on PPI and carafate to outpatient follow-up.     On exam, he endorses several months of progressive generalized weakness, 30# of weight loss, night sweats, and worsening pain. Notes decreased energy, The pain moves and is not one spot in specific. He takes Tylenol up to 9 325mg  tables a day that brings pain from 8 to a 3 generally.  He does not improvement in GERD and no melena/hematochezia. His other main complaint is hesitancy and slow stream; endorses having complete inability to void at times but can always eventually go. No fever, chills, weight changes, headache, vision changes, chest pain, SOB, cough, abdominal pain, N/V/D, constipation, other sites of pain.     Not having any hot flashes. More confused - since a month ago. Fatigue, a little worse than a month ago.  On acetaminophen now for pain - used last of oxycodone. Wants refill.  Having regular BM. Able to go longer at night without needing to urinate, flow a little different.    Past Medical History:  Gastric Bypass Surgery 2010  CABG ~2005, CAD  Bovine Aortic Valve  PVD  PAD  HTN    Medications:    Current Outpatient Medications   Medication Instructions   ??? buPROPion (WELLBUTRIN XL) 150 mg, Oral, Daily (standard)   ??? citalopram (CELEXA) 20 mg, Oral, Daily (standard)   ??? docusate sodium (COLACE) 100 mg, Oral, 2 times a day (standard)   ??? ergocalciferol-1,250 mcg (50,000 unit) (VITAMIN D2-1,250 MCG (50,000 UNIT)) 1,250 mcg, Oral, Weekly   ??? furosemide (LASIX) 40 mg, Oral, Daily PRN   ??? lisinopriL (PRINIVIL,ZESTRIL) 40 MG tablet Take 1 tablet by mouth daily.   ??? methylphenidate HCl 27 mg, Oral   ??? omeprazole (PRILOSEC) 40 mg, Oral, 2 times a day, OPEN capsule and ingest contents of capsule only. After 8 weeks of twice daily dosing, switch to once daily dosing.   ??? ORGOVYX 120 mg, Oral, Daily (standard), Take with or without food. Swallow tablets whole. Do not crush or chew tablet.   ??? oxyCODONE (ROXICODONE) 5 MG immediate release tablet Take 1 to 2 tablets by mouth every four hours as needed for pain. ??? rosuvastatin (CRESTOR) 5 mg, Oral, Nightly   ??? traZODone (DESYREL) 150 mg, Oral, Nightly   ??? XTANDI 160 mg, Oral, Daily (standard), Swallow tablets whole. Do not cut, crush, or chew the tablets.     Allergies:  Allergies   Allergen Reactions   ??? Metformin Swelling   ??? Mirtazapine Anxiety     Memory confusion   ??? Janumet [Sitagliptin-Metformin]    ??? Indomethacin Anxiety     (INDOCIN)       Social History:  Live in [place], [place] with his wife [name]. 3 Kids. Smoked 60 years but quit last week. Occasional alcohol use. 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     Family History:  Mother: breast cancer (31)  Father: died at 23 with MI  Sister: breast cancer (50)  Three other siblings, no cancer  Three grown children without medical problems     Prostate Cancer Family History Assessment:  ??? History of cancer in children (yes/no; if yes, what type AND age of  diagnosis): 3 kids, No cancer  ??? Total number of siblings (including deceased): 4  ??? History of cancer in siblings (yes/no; if yes, provide relation, type of cancer, AND age of diagnosis): Yes, sister had breast cancer in her 80's  ??? History of cancer in parents (yes/no; if yes, please specify parent, type of cancer, AND age of diagnosis): Yes, mother had breast cancer in her 68's  ??? History of cancer in aunts/uncles/grandparents (yes/no; if yes, provide relation, type of cancer, AND age of diagnosis): no    Review of Systems: A 10 system review was completed and was negative except per HPI    Physical Exam:  BSA: 1.89 meters squared  BP 126/62  - Pulse 70  - Temp 36.7 ??C (98 ??F) (Temporal)  - Resp 16  - Ht 180.3 cm (5' 11)  - Wt 71.3 kg (157 lb 3.2 oz)  - SpO2 96%  - BMI 21.92 kg/m??   ECOG PS 2    General:   No acute distress, alert and interactive, appears older than stated age, able to get on exam table unassisted though slowly.    Eyes:   Pupils equally round and reactive to light.  Extra ocular muscles intact.  Sclera not icteric.   Cardiovascular:  RRR with 2/6 systolic murmur at RUSB   Lungs:  Clear to auscultation bilaterally, without wheezes/crackles/rhonchi.   Skin:    No rash, lesions or breakdown    Abdomen:   Abdomen soft, not tender and not distended, no hepatosplenomegaly or masses.   Extremities:   Warm and well-perfused without edema.   Neurological:  Alert and oriented to person, place and time.     Labs:    Reviewed in Epic    12/19/20 PSA 1526  01/16/21 97    Imaging:  CT Chest 12/19/20:  -Unchanged 0.9 cm right upper lobe pulmonary nodule. Favored to be benign.  ??  -Innumerable sclerotic osseous metastatic lesions increased as compared to prior chest CT and the extent of which is better characterized on recent bone scan.    CT AP 11/24/20  -- Extensive sclerotic osseous metastatic disease throughout the visualized axial and appendicular skeleton and enlarged left pelvic sidewall lymph node measuring up to 1.9 cm, also worrisome for metastatic disease. There is question of a lesion in the left apical prostate. Correlation with PSA is suggested.  ??  -- Wall thickening of a segment of sigmoid colon which is favored to represent changes of remote/chronic diverticulitis with underlying mass considered less likely. Sigmoidoscopy could be considered.    Bone Scan 12/16/20  Numerous abnormal foci of radiotracer uptake consistent with osseous metastatic disease.    Pathology:  Prostate Biopsy 11/30/20  Final Diagnosis   A: Prostate, right apex, core biopsy  - Small focus of prostatic adenocarcinoma, Gleason score 3 + 3 = 6 (Grade group 1), involving 1 of 1 cores, less than 5% of total core length.      B: Prostate, right mid, core biopsy  - Prostatic adenocarcinoma, Gleason score 3 + 3 = 6 (Grade group 1) involving 1 of 1 cores, approximately 3 mm in linear extent, approximately 20% of total core length.     C: Prostate, right base, core biopsy  - Prostate tissue with small focus of atypical glands, suspicious for carcinoma.   - Due to atrophic features, a definitive diagnosis cannot be made.     D: Prostate, left apex, core biopsy  - Prostatic adenocarcinoma, Gleason score 4 + 5 =  9 (Grade group 5) involving 1 of 1 cores, approximately 13 mm in linear extent, approximately 95% of total core length.   ??  E: Prostate, left mid, core biopsy  - Prostatic adenocarcinoma, Gleason score 5 + 4 = 9 (Grade group 5) involving 1 of 1 cores, approximately 10 mm in linear extent, approximately 95% of total core length.     F: Prostate, left base, core biopsy  - Small focus of prostatic adenocarcinoma, Gleason score 4 + 5 = 9 (Grade group 5), involving 1 of 1 cores, less than 5% of total core length.

## 2021-01-17 NOTE — Unmapped (Signed)
What is genetic testing, and how it is used?  - Genes are instructions that help your body grow and develop. Some genes specifically help prevent cancer. We are ordering genetic testing of 17 genes, including BRCA1 and BRCA2.   - Genetic testing checks to see if these ???cancer prevention??? genes are working correctly.  Some people have a misspelling in their genes that puts them at an increased risk for prostate cancer and potentially other cancer  - Your blood sample will be collected and shipped to a lab called [facility]. Results of this test take 3 weeks to come back. You may see this result in your [facility] before your next appointment with your provider. If you have questions about this result, please contact your provider.  - This testing will be used to help direct your medical care, both in management of your current cancer and possibly to guide your future cancer screening. The test result can also provide information about the cancer risk for your family members.    How much does the test cost out of pocket?  - Most people pay less than $100 for genetic testing. Your medical insurance will most likely cover all or a large portion of the cost.   - Once [facility] receives your sample, they will contact you with an estimated out-of-pocket cost via text to a mobile number or email. If the cost of testing is too high through insurance, the laboratory has a self-pay price of $250. In this case, you would have to contact [facility] and choose the self-pay option. [facility] also has a financial assistance program that can help with the cost.   - If you are concerned about the cost of your genetic testing, please contact [facility] at [phone] or email [email or web address]     What are the possible results?  - Positive - If one of your cancer prevention genes doesn???t work, we know certain treatment options may work better. In addition, your relatives could have testing to see if they???re at a higher risk of developing cancer in their lifetime.  - Negative -  If you don???t have a gene change, then other treatment options may be recommended.   - Variant of Uncertain Significance (VUS) - Sometimes we get an unclear result, but these are typically treated as negative results. The lab will follow this result over time for additional information about the variant in the scientific literature.     Any possible risks?  - Risks - this test may cost money out of pocket--maximum $250, although most patients pay nothing since the test is often covered by insurance. Some patients are also concerned with privacy of results. There are laws that protect the privacy of your genetic test results.   - Benefits - this test may help your doctor choose the best treatment for your cancer. It may also provide additional information on cancer risks for you and your family.     What is an electronic consult?  - To help [place] interpret any of your genetic test results, we would like to ask our colleagues in [place] to help [place]. Their team will look at your medical record and ensure we are ordering the best test for you. This is called an ???electronic consult???. This will not require a separate appointment for most patients.   - Most patients will not have an out-of-pocket expense for this. If your insurance does not cover this service, the maximum bill you could receive would be around ~$100. Most patients  will pay far less than that.    It was a pleasure to see you in clinic today.    Contact information:    Nurse Navigator: Joaquin Bend  Phone: [phone]  For scheduling needs or questions Monday through Friday 8AM-5PM, please contact [phone] or Toll free [phone]  For urgent needs on nights and weekends, call [phone] and ask for the oncology fellow on call.    Please visit Inrails.de, a resource created just for family members and caregivers. This website lists support services, how and where to ask for help. It has tools to assist you as you help [place] care for your loved one.    [facility]  [address]  [place], [place] [address]  www.[facility].org

## 2021-01-18 DIAGNOSIS — C61 Malignant neoplasm of prostate: Principal | ICD-10-CM

## 2021-01-18 DIAGNOSIS — C7951 Secondary malignant neoplasm of bone: Principal | ICD-10-CM

## 2021-01-18 NOTE — Unmapped (Signed)
Reviewed [employer] testing and financial assistance application with patient and wife, [name]. Application completed online: [Your application was approved for full coverage.??Your out-of-pocket costs will be $0.]. Patient informed of [employer] decision.

## 2021-01-19 NOTE — Unmapped (Signed)
Addended by: Marlane Mingle on: 01/19/2021 12:27 PM     Modules accepted: Orders

## 2021-01-20 ENCOUNTER — Ambulatory Visit: Admit: 2021-01-20 | Payer: MEDICARE

## 2021-01-23 DIAGNOSIS — C7951 Secondary malignant neoplasm of bone: Principal | ICD-10-CM

## 2021-01-23 DIAGNOSIS — C61 Malignant neoplasm of prostate: Principal | ICD-10-CM

## 2021-01-25 DIAGNOSIS — M810 Age-related osteoporosis without current pathological fracture: Principal | ICD-10-CM

## 2021-01-25 DIAGNOSIS — C61 Malignant neoplasm of prostate: Principal | ICD-10-CM

## 2021-01-25 DIAGNOSIS — C7951 Secondary malignant neoplasm of bone: Principal | ICD-10-CM

## 2021-01-25 NOTE — Unmapped (Signed)
Addended by: Ihor Austin on: 01/25/2021 02:23 PM     Modules accepted: Orders

## 2021-01-26 NOTE — Unmapped (Signed)
Returned call to [facility]. Let them know pt is receiving Xtandi from [facility]. They will call back with any further needs.

## 2021-01-26 NOTE — Unmapped (Signed)
Spoke to patient - will do manual reset tx today.

## 2021-01-26 NOTE — Unmapped (Signed)
Hi,     Douglas Gray contacted the Communication Center regarding the following:    - prescription needed for Xtandi 80 mg    Please contact Douglas Gray at [phone]  Fax [phone].    Thanks in advance,    [name]  [facility]   [phone]

## 2021-01-26 NOTE — Unmapped (Signed)
Refill not needed at this time:    Orgovyx started 01/16/21  Enza start date ~01/30/21    [facility] will reschedule clinical call to 02/02/21

## 2021-01-27 ENCOUNTER — Encounter: Admit: 2021-01-27 | Discharge: 2021-01-27 | Payer: MEDICARE | Attending: Anesthesiology | Primary: Anesthesiology

## 2021-01-27 ENCOUNTER — Ambulatory Visit: Admit: 2021-01-27 | Discharge: 2021-01-27 | Payer: MEDICARE

## 2021-01-27 MED ORDER — PANTOPRAZOLE 40 MG TABLET,DELAYED RELEASE
ORAL_TABLET | Freq: Two times a day (BID) | ORAL | 2 refills | 30 days | Status: CP
Start: 2021-01-27 — End: 2021-02-26

## 2021-01-27 MED ADMIN — ePHEDrine (PF) 25 mg/5 mL (5 mg/mL) in 0.9% sodium chloride syringe Syrg: INTRAVENOUS | @ 19:00:00 | Stop: 2021-01-27

## 2021-01-27 MED ADMIN — propofoL (DIPRIVAN) injection: INTRAVENOUS | @ 19:00:00 | Stop: 2021-01-27

## 2021-01-27 MED ADMIN — propofol (DIPRIVAN) infusion 10 mg/mL: INTRAVENOUS | @ 19:00:00 | Stop: 2021-01-27

## 2021-01-27 MED ADMIN — lidocaine (XYLOCAINE) 20 mg/mL (2 %) injection: INTRAVENOUS | @ 19:00:00 | Stop: 2021-01-27

## 2021-01-27 MED ADMIN — lactated Ringers infusion: 10 mL/h | INTRAVENOUS | @ 18:00:00 | Stop: 2021-01-27

## 2021-01-27 NOTE — Unmapped (Signed)
Pt refused post op call , he will call with questions or concerns

## 2021-01-31 MED ORDER — OXYCODONE 5 MG TABLET
ORAL_TABLET | ORAL | 0 refills | 10 days | Status: CP | PRN
Start: 2021-01-31 — End: ?

## 2021-01-31 NOTE — Unmapped (Addendum)
Spoke to patient to let him know the below message. Patient is okay with plan.     [name], RN      ----- Message from Jennye Moccasin, MD sent at 01/25/2021  2:24 PM EST -----  Regarding: zoledronic acid  Trixy Loyola - I also added yearly zoledronic acid for him given osteoporosis on DEXA. Requested for 4/4 at next visit - will you let him know ?Thx  French Ana

## 2021-01-31 NOTE — Unmapped (Addendum)
Pt made aware that script has been sent.       Douglas Gray      ----- Message from Jennye Moccasin, MD sent at 01/31/2021 10:27 AM EST -----  Regarding: RE: refill on oxy  I sent  T  ----- Message -----  From: [name], RN  Sent: 01/30/2021   1:22 PM EST  To: Daylene Posey, CPP, #  Subject: refill on oxy                                    Patient requesting a refill on oxy.    [name]

## 2021-02-01 ENCOUNTER — Institutional Professional Consult (permissible substitution)
Admit: 2021-02-01 | Discharge: 2021-02-02 | Payer: MEDICARE | Attending: Pharmacist Clinician (PhC)/ Clinical Pharmacy Specialist | Primary: Pharmacist Clinician (PhC)/ Clinical Pharmacy Specialist

## 2021-02-01 DIAGNOSIS — C7951 Secondary malignant neoplasm of bone: Principal | ICD-10-CM

## 2021-02-01 DIAGNOSIS — C61 Malignant neoplasm of prostate: Principal | ICD-10-CM

## 2021-02-01 NOTE — Unmapped (Signed)
Clinical Pharmacist Practitioner: [facility] Clinic- Telephone Encounter    Patient Name: Douglas Gray  Patient Age: 75 y.o.    Assessment and recommendations:  74 y.o. 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     1. 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 PSA 1526.76 with baseline testosterone 138. Repeat PSA 96.85 after starting relugolix. Started enzalutamide this week and feels over all well. Main complaint is pain  - Continue Enzalutamide 160 mg and relugolix 120 mg daily    2. Malignant pain- currently taking oxycodone 5 mg 8 tablets per day. He has pain all over but mainly in lower back and legs/knee joints). Pain relief only lasting a few hours prior to returning so needs to take every 3-4 hours. Enzalutamide will induce oxycodone so pain needs may increase over the next week or 2. Patient denies constipation.   - Assess pain needs with f/u phone call  - Start long acting for more consistent pain management    3. Bone health- start calcium and vitamin D, DEXA scan shows low bone mineral density consider bone modifying agent for osteoporosis (prolia/reclast)    Follow- up: Follow up call later this week to assess pain, Dr. Okey Dupre on 02/27/21 for follow up, CPP f/u in clinic.     ______________________________________________________________________________________    Reason for visit:  Hormonal therapy monitoring and side effect management    Current Drug and Dose: Plan for enzalutamide 160 mg and relugolix 120 mg daily    Date of Initiation: Relugolix 01/16/21, Enzalutamide 01/28/21    Interval History:  Douglas Gray is feeling well. No difference has been achy, knee joints and lower back, taking oxycodone for this taking every 3-4 hours takes 2 tablets at a time. Aches and pains come and go ad move around before taking oxy 7/10 pain then goes down to 5/5.5 for a few hours  Pain meds lasting longer but no less intense, he is taking on average 8 tablets per day. He doesn't note any other difference with starting his cancer treatment.     Adherence: morning 9-10 relugolix 1 tablet, and 2 xtandi, no missed doses    Drug Interactions: Enzalutamide is a CYP3A4 inducer and may reduce the concentrations of amlodipine, atorvastatin, trazodone, omeprazole, oxycodone and losartan. Have resolved most of these, (switch to lisinopril and rosuvastatin)    Oncology History   Prostate cancer metastatic to bone (CMS-HCC)   12/19/2020 Initial Diagnosis    Prostate cancer metastatic to bone (CMS-HCC)     12/19/2020 - 01/16/2021 Endocrine/Hormone Therapy    OP DEGARELIX  Plan Provider: Jennye Moccasin, MD         Vital Signs for this encounter:  There were no vitals taken for this visit.  Wt Readings from Last 3 Encounters:   01/27/21 74.8 kg (165 lb)   01/16/21 71.3 kg (157 lb 3.2 oz)   12/19/20 68.3 kg (150 lb 8 oz)       Medications:  Current Outpatient Medications   Medication Sig Dispense Refill   ??? buPROPion (WELLBUTRIN XL) 150 MG 24 hr tablet Take 150 mg by mouth daily. (Patient not taking: Reported on 01/27/2021)     ??? citalopram (CELEXA) 20 MG tablet Take 20 mg by mouth daily.      ??? enzalutamide (XTANDI) 80 mg tablet Take 2 tablets (  160 mg total) by mouth daily. Swallow tablets whole. Do not cut, crush, or chew the tablets. (Patient not taking: Reported on 01/27/2021) 60 tablet 5   ??? ergocalciferol-1,250 mcg, 50,000 unit, (VITAMIN D2-1,250 MCG, 50,000 UNIT,) 1,250 mcg (50,000 unit) capsule Take 1,250 mcg by mouth once a week.     ??? furosemide (LASIX) 40 MG tablet Take 40 mg by mouth daily as needed.      ??? lisinopriL (PRINIVIL,ZESTRIL) 40 MG tablet Take 1 tablet by mouth daily. 30 tablet 11   ??? methylphenidate HCl (CONCERTA) 27 MG CR tablet Take 27 mg by mouth. (Patient not taking: Reported on 01/27/2021)     ??? oxyCODONE (ROXICODONE) 5 MG immediate release tablet Take 1 to 2 tablets by mouth every four hours as needed for pain. 120 tablet 0   ??? pantoprazole (PROTONIX) 40 MG tablet Take 1 tablet (40 mg total) by mouth Two (2) times a day. 60 tablet 2   ??? relugolix (ORGOVYX) 120 mg tablet Take 1 tablet (120 mg total) by mouth daily. Take with or without food. Swallow tablets whole. Do not crush or chew tablet. 30 tablet 5   ??? rosuvastatin (CRESTOR) 5 MG tablet Take 1 tablet (5 mg total) by mouth nightly. 30 tablet 11   ??? traZODone (DESYREL) 50 MG tablet Take 150 mg by mouth nightly.   2     No current facility-administered medications for this visit.     Facility-Administered Medications Ordered in Other Visits   Medication Dose Route Frequency Provider Last Rate Last Admin   ??? degarelix (FIRMAGON) injection 80 mg  80 mg Subcutaneous Once Evalina Field, MD           LABS:  Lab Results   Component Value Date    WBC 5.9 01/16/2021    HGB 13.7 01/16/2021    HCT 45.3 01/16/2021    PLT 162 01/16/2021       Lab Results   Component Value Date    NA 141 01/16/2021    K 4.9 01/16/2021    CL 112 (H) 01/16/2021    CO2 26.0 01/16/2021    BUN 25 (H) 01/16/2021    CREATININE 1.35 (H) 01/16/2021    GLU 93 01/16/2021    CALCIUM 8.3 (L) 01/16/2021    MG 2.0 05/25/2020    PHOS 2.1 (L) 04/09/2012       Lab Results   Component Value Date    BILITOT 0.4 01/16/2021    BILIDIR 0.2 04/10/2012    PROT 4.9 (L) 01/16/2021    ALBUMIN 2.5 (L) 01/16/2021    ALT 10 01/16/2021    AST 15 01/16/2021    ALKPHOS 2,646 (H) 01/16/2021       Lab Results   Component Value Date    LABPROT 13.9 12/03/2012    INR 1.11 12/01/2020    APTT 29.0 03/24/2020     I spent 30 minutes in direct patient care.    Laverna Peace PharmD, BCOP, CPP  Hematology/Oncology Pharmacist  P: [phone]

## 2021-02-03 MED ORDER — ENZALUTAMIDE 80 MG TABLET
ORAL_TABLET | Freq: Every day | ORAL | 5 refills | 30.00000 days | Status: CP
Start: 2021-02-03 — End: 2021-03-05

## 2021-02-03 MED ORDER — RELUGOLIX 120 MG TABLET
ORAL_TABLET | Freq: Every day | ORAL | 5 refills | 30 days | Status: CP
Start: 2021-02-03 — End: ?

## 2021-02-03 MED ORDER — XTAMPZA ER 13.5 MG CAPSULE SPRINKLE
Freq: Two times a day (BID) | ORAL | 0 refills | 0 days | Status: CP
Start: 2021-02-03 — End: ?

## 2021-02-03 NOTE — Unmapped (Signed)
This patient has been disenrolled from the [facility] specialty pharmacy services due to a pharmacy change resulting from insurance limitations. The insurance company requires the patient fill at [facility].    Kermit Balo  [facility] Specialty Pharmacist

## 2021-02-03 NOTE — Unmapped (Signed)
Douglas Gray 's Orgovyx and [facility] shipment will be canceled  as a result of no longer being eligible to fill at [facility] pharmacy.     I have reached out to the patient  at [phone] and communicated the delivery change. We will not reschedule the medication and have removed this/these medication(s) from the work request.  We have canceled this work request.      Spoke with patient's wife and clinic has been notified.  Prescriptions have been routed to [facility].    Horace Porteous, PharmD  [facility]

## 2021-02-03 NOTE — Unmapped (Signed)
[name] Clinical Assessment & Refill Coordination Note    Douglas Gray, DOB: 1946/07/03  Phone: [phone] (home)     All above HIPAA information was verified with patient.     Was a Nurse, learning disability used for this call? No    Specialty Medication(s):   Hematology/Oncology: [name]     Current Outpatient Medications   Medication Sig Dispense Refill   ??? citalopram (CELEXA) 20 MG tablet Take 20 mg by mouth daily.      ??? enzalutamide (XTANDI) 80 mg tablet Take 2 tablets (160 mg total) by mouth daily. Swallow tablets whole. Do not cut, crush, or chew the tablets. 60 tablet 5   ??? ergocalciferol-1,250 mcg, 50,000 unit, (VITAMIN D2-1,250 MCG, 50,000 UNIT,) 1,250 mcg (50,000 unit) capsule Take 1,250 mcg by mouth once a week.     ??? furosemide (LASIX) 40 MG tablet Take 40 mg by mouth daily as needed.      ??? lisinopriL (PRINIVIL,ZESTRIL) 40 MG tablet Take 1 tablet by mouth daily. 30 tablet 11   ??? oxyCODONE (ROXICODONE) 5 MG immediate release tablet Take 1 to 2 tablets by mouth every four hours as needed for pain. 120 tablet 0   ??? pantoprazole (PROTONIX) 40 MG tablet Take 1 tablet (40 mg total) by mouth Two (2) times a day. 60 tablet 2   ??? relugolix (ORGOVYX) 120 mg tablet Take 1 tablet (120 mg total) by mouth daily. Take with or without food. Swallow tablets whole. Do not crush or chew tablet. 30 tablet 5   ??? rosuvastatin (CRESTOR) 5 MG tablet Take 1 tablet (5 mg total) by mouth nightly. 30 tablet 11   ??? traZODone (DESYREL) 50 MG tablet Take 150 mg by mouth nightly.   2     No current facility-administered medications for this visit.     Facility-Administered Medications Ordered in Other Visits   Medication Dose Route Frequency Provider Last Rate Last Admin   ??? degarelix (FIRMAGON) injection 80 mg  80 mg Subcutaneous Once Evalina Field, MD            Changes to medications: Chananya reports no changes at this time.    Allergies   Allergen Reactions   ??? Metformin Swelling   ??? Mirtazapine Anxiety     Memory confusion   ??? Janumet [Sitagliptin-Metformin]    ??? Indomethacin Anxiety     (INDOCIN)         Changes to allergies: No    SPECIALTY MEDICATION ADHERENCE     Orgovyx 120 mg: unsure how many days of medicine on hand   Xtandi 80 mg: unsure how many days of medicine on hand     Medication Adherence    Patient reported X missed doses in the last month: 0  Specialty Medication: Orgovyx 120 mg daily  Patient is on additional specialty medications: Yes  Additional Specialty Medications: Xtandi 80 mg - 2 tabs daily  Patient Reported Additional Medication X Missed Doses in the Last Month: 0  Patient is on more than two specialty medications: No  Informant: patient  Confirmed plan for next specialty medication refill: delivery by pharmacy          Specialty medication(s) dose(s) confirmed: Regimen is correct and unchanged.     Are there any concerns with adherence? No    Adherence counseling provided? Not needed    CLINICAL MANAGEMENT AND INTERVENTION      Clinical Benefit Assessment:    Do you  feel the medicine is effective or helping your condition? Yes    Clinical Benefit counseling provided? Not needed    Adverse Effects Assessment:    Are you experiencing any side effects? No    Are you experiencing difficulty administering your medicine? No    Quality of Life Assessment:    How many days over the past month did your prostate cancer  keep you from your normal activities? For example, brushing your teeth or getting up in the morning. 0    Have you discussed this with your provider? Not needed    Therapy Appropriateness:    Is therapy appropriate? Yes, therapy is appropriate and should be continued    DISEASE/MEDICATION-SPECIFIC INFORMATION      N/A    PATIENT SPECIFIC NEEDS     - Does the patient have any physical, cognitive, or cultural barriers? No    - Is the patient high risk? Yes, patient is taking oral chemotherapy. Appropriateness of therapy as been assessed    - Does the patient require a Care Management Plan? No     - Does the patient require physician intervention or other additional services (i.e. nutrition, smoking cessation, social work)? No      SHIPPING     Specialty Medication(s) to be Shipped:   Hematology/Oncology: Orgovyx and Xtandi    Other medication(s) to be shipped: No additional medications requested for fill at this time     Changes to insurance: No    Delivery Scheduled: Yes, Expected medication delivery date: 02/04/21.     Medication will be delivered via Next Day Courier to the confirmed prescription address in [facility].    The patient will receive a drug information handout for each medication shipped and additional FDA Medication Guides as required.  Verified that patient has previously received a Conservation officer, historic buildings.    All of the patient's questions and concerns have been addressed.    Breck Coons [facility] Specialty Pharmacist

## 2021-02-03 NOTE — Unmapped (Signed)
Called Mr. Deschamps to follow up on his pain. He has been averaging 6-8 tablets of oxycodone (5 mg) per day so 30 to 40 mg per day. I will plan to start him on xtampza 13.5 mg BID (each equivalent to 15 mg of oxycodone). We know enzalutamide induces oxycodone so his pain mediation requirement may increase over the next few weeks, not due to worsening pain but due to reduced oxycodone concentrations from induction of CYP3A4 via enzalutamide.    I counseled him on long acting pain medication, how to use his short acting pain medications for prn use and constipation. He currently denies constipation with his current opioid use.     I also resent his xtandi and orgovyx to [facility] as required by his insurance company. Him and his wife will let us know if they have any issues with that.     Laverna Peace PharmD, BCOP, CPP  Hematology/Oncology Pharmacist  P: [phone]

## 2021-02-08 ENCOUNTER — Emergency Department: Payer: Medicare Other

## 2021-02-08 ENCOUNTER — Observation Stay: Payer: Medicare Other

## 2021-02-08 ENCOUNTER — Other Ambulatory Visit: Payer: Self-pay

## 2021-02-08 ENCOUNTER — Inpatient Hospital Stay
Admission: EM | Admit: 2021-02-08 | Discharge: 2021-02-17 | DRG: 183 | Disposition: A | Discharge status: Skilled Nursing Facility | Payer: Medicare Other | Attending: Internal Medicine | Admitting: Internal Medicine

## 2021-02-08 DIAGNOSIS — S2241XA Multiple fractures of ribs, right side, initial encounter for closed fracture: Secondary | ICD-10-CM | POA: Diagnosis not present

## 2021-02-08 DIAGNOSIS — J9601 Acute respiratory failure with hypoxia: Secondary | ICD-10-CM | POA: Diagnosis present

## 2021-02-08 DIAGNOSIS — N179 Acute kidney failure, unspecified: Secondary | ICD-10-CM | POA: Diagnosis present

## 2021-02-08 DIAGNOSIS — M7989 Other specified soft tissue disorders: Secondary | ICD-10-CM | POA: Diagnosis present

## 2021-02-08 DIAGNOSIS — Z66 Do not resuscitate: Secondary | ICD-10-CM | POA: Diagnosis present

## 2021-02-08 DIAGNOSIS — I739 Peripheral vascular disease, unspecified: Secondary | ICD-10-CM | POA: Diagnosis present

## 2021-02-08 DIAGNOSIS — Z87891 Personal history of nicotine dependence: Secondary | ICD-10-CM

## 2021-02-08 DIAGNOSIS — J9383 Other pneumothorax: Secondary | ICD-10-CM | POA: Diagnosis present

## 2021-02-08 DIAGNOSIS — C61 Malignant neoplasm of prostate: Secondary | ICD-10-CM | POA: Diagnosis present

## 2021-02-08 DIAGNOSIS — I480 Paroxysmal atrial fibrillation: Secondary | ICD-10-CM | POA: Diagnosis present

## 2021-02-08 DIAGNOSIS — M6281 Muscle weakness (generalized): Secondary | ICD-10-CM | POA: Diagnosis not present

## 2021-02-08 DIAGNOSIS — Z823 Family history of stroke: Secondary | ICD-10-CM

## 2021-02-08 DIAGNOSIS — I129 Hypertensive chronic kidney disease with stage 1 through stage 4 chronic kidney disease, or unspecified chronic kidney disease: Secondary | ICD-10-CM | POA: Diagnosis present

## 2021-02-08 DIAGNOSIS — C7951 Secondary malignant neoplasm of bone: Secondary | ICD-10-CM | POA: Diagnosis present

## 2021-02-08 DIAGNOSIS — Z951 Presence of aortocoronary bypass graft: Secondary | ICD-10-CM

## 2021-02-08 DIAGNOSIS — W19XXXA Unspecified fall, initial encounter: Secondary | ICD-10-CM | POA: Diagnosis not present

## 2021-02-08 DIAGNOSIS — Z809 Family history of malignant neoplasm, unspecified: Secondary | ICD-10-CM

## 2021-02-08 DIAGNOSIS — R52 Pain, unspecified: Secondary | ICD-10-CM

## 2021-02-08 DIAGNOSIS — Z952 Presence of prosthetic heart valve: Secondary | ICD-10-CM

## 2021-02-08 DIAGNOSIS — J939 Pneumothorax, unspecified: Secondary | ICD-10-CM | POA: Diagnosis present

## 2021-02-08 DIAGNOSIS — R600 Localized edema: Secondary | ICD-10-CM | POA: Diagnosis present

## 2021-02-08 DIAGNOSIS — Z79899 Other long term (current) drug therapy: Secondary | ICD-10-CM

## 2021-02-08 DIAGNOSIS — I10 Essential (primary) hypertension: Secondary | ICD-10-CM | POA: Diagnosis present

## 2021-02-08 DIAGNOSIS — R42 Dizziness and giddiness: Secondary | ICD-10-CM

## 2021-02-08 DIAGNOSIS — E785 Hyperlipidemia, unspecified: Secondary | ICD-10-CM | POA: Diagnosis present

## 2021-02-08 DIAGNOSIS — Z7984 Long term (current) use of oral hypoglycemic drugs: Secondary | ICD-10-CM

## 2021-02-08 DIAGNOSIS — Z20822 Contact with and (suspected) exposure to covid-19: Secondary | ICD-10-CM | POA: Diagnosis present

## 2021-02-08 DIAGNOSIS — K219 Gastro-esophageal reflux disease without esophagitis: Secondary | ICD-10-CM | POA: Diagnosis present

## 2021-02-08 DIAGNOSIS — I951 Orthostatic hypotension: Secondary | ICD-10-CM | POA: Diagnosis present

## 2021-02-08 DIAGNOSIS — N1832 Chronic kidney disease, stage 3b: Secondary | ICD-10-CM | POA: Diagnosis present

## 2021-02-08 DIAGNOSIS — S2249XA Multiple fractures of ribs, unspecified side, initial encounter for closed fracture: Secondary | ICD-10-CM | POA: Diagnosis present

## 2021-02-08 DIAGNOSIS — W010XXA Fall on same level from slipping, tripping and stumbling without subsequent striking against object, initial encounter: Secondary | ICD-10-CM | POA: Diagnosis present

## 2021-02-08 DIAGNOSIS — Z8249 Family history of ischemic heart disease and other diseases of the circulatory system: Secondary | ICD-10-CM

## 2021-02-08 DIAGNOSIS — M79604 Pain in right leg: Secondary | ICD-10-CM

## 2021-02-08 DIAGNOSIS — R54 Age-related physical debility: Secondary | ICD-10-CM | POA: Diagnosis present

## 2021-02-08 DIAGNOSIS — R630 Anorexia: Secondary | ICD-10-CM | POA: Diagnosis present

## 2021-02-08 DIAGNOSIS — Z6825 Body mass index (BMI) 25.0-25.9, adult: Secondary | ICD-10-CM

## 2021-02-08 DIAGNOSIS — Z888 Allergy status to other drugs, medicaments and biological substances status: Secondary | ICD-10-CM

## 2021-02-08 DIAGNOSIS — Z9221 Personal history of antineoplastic chemotherapy: Secondary | ICD-10-CM

## 2021-02-08 DIAGNOSIS — R001 Bradycardia, unspecified: Secondary | ICD-10-CM | POA: Diagnosis present

## 2021-02-08 LAB — COMPREHENSIVE METABOLIC PANEL
ALT: 14 U/L (ref 0–44)
AST: 20 U/L (ref 15–41)
Albumin: 2.3 g/dL — ABNORMAL LOW (ref 3.5–5.0)
Alkaline Phosphatase: 911 U/L — ABNORMAL HIGH (ref 38–126)
Anion gap: 6 (ref 5–15)
BUN: 45 mg/dL — ABNORMAL HIGH (ref 8–23)
CO2: 26 mmol/L (ref 22–32)
Calcium: 7.8 mg/dL — ABNORMAL LOW (ref 8.9–10.3)
Chloride: 111 mmol/L (ref 98–111)
Creatinine, Ser: 1.56 mg/dL — ABNORMAL HIGH (ref 0.61–1.24)
GFR, Estimated: 46 mL/min — ABNORMAL LOW (ref >60)
Glucose, Bld: 102 mg/dL — ABNORMAL HIGH (ref 70–99)
Potassium: 4.3 mmol/L (ref 3.5–5.1)
Sodium: 143 mmol/L (ref 135–145)
Total Bilirubin: 0.8 mg/dL (ref 0.3–1.2)
Total Protein: 4.5 g/dL — ABNORMAL LOW (ref 6.5–8.1)

## 2021-02-08 LAB — URINALYSIS, COMPLETE (UACMP) WITH MICROSCOPIC
Bacteria, UA: NONE SEEN
Bilirubin Urine: NEGATIVE
Glucose, UA: NEGATIVE mg/dL
Hgb urine dipstick: NEGATIVE
Ketones, ur: NEGATIVE mg/dL
Leukocytes,Ua: NEGATIVE
Nitrite: NEGATIVE
Protein, ur: NEGATIVE mg/dL
Specific Gravity, Urine: 1.02 (ref 1.005–1.030)
Squamous Epithelial / HPF: NONE SEEN (ref 0–5)
pH: 5 (ref 5.0–8.0)

## 2021-02-08 LAB — CBC WITH DIFFERENTIAL/PLATELET
Abs Immature Granulocytes: 0.18 10*3/uL — ABNORMAL HIGH (ref 0.00–0.07)
Basophils Absolute: 0.1 10*3/uL (ref 0.0–0.1)
Basophils Relative: 1 %
Eosinophils Absolute: 0.1 10*3/uL (ref 0.0–0.5)
Eosinophils Relative: 1 %
HCT: 38.1 % — ABNORMAL LOW (ref 39.0–52.0)
Hemoglobin: 11.8 g/dL — ABNORMAL LOW (ref 13.0–17.0)
Immature Granulocytes: 2 %
Lymphocytes Relative: 18 %
Lymphs Abs: 1.4 10*3/uL (ref 0.7–4.0)
MCH: 28.9 pg (ref 26.0–34.0)
MCHC: 31 g/dL (ref 30.0–36.0)
MCV: 93.2 fL (ref 80.0–100.0)
Monocytes Absolute: 0.6 10*3/uL (ref 0.1–1.0)
Monocytes Relative: 8 %
Neutro Abs: 5.3 10*3/uL (ref 1.7–7.7)
Neutrophils Relative %: 70 %
Platelets: 174 10*3/uL (ref 150–400)
RBC: 4.09 MIL/uL — ABNORMAL LOW (ref 4.22–5.81)
RDW: 15.9 % — ABNORMAL HIGH (ref 11.5–15.5)
WBC: 7.5 10*3/uL (ref 4.0–10.5)
nRBC: 0 % (ref 0.0–0.2)

## 2021-02-08 LAB — SARS CORONAVIRUS 2 (TAT 6-24 HRS): SARS Coronavirus 2: NEGATIVE

## 2021-02-08 LAB — TROPONIN I (HIGH SENSITIVITY): Troponin I (High Sensitivity): 12 ng/L (ref <18)

## 2021-02-08 MED ORDER — SENNOSIDES-DOCUSATE SODIUM 8.6-50 MG PO TABS
1.0000 | ORAL_TABLET | Freq: Every day | ORAL | Status: DC
  Administered 2021-02-09 – 2021-02-16 (×8): 1 via ORAL
  Filled 2021-02-08 (×10): qty 1

## 2021-02-08 MED ORDER — TRAZODONE HCL 50 MG PO TABS
50.0000 mg | ORAL_TABLET | Freq: Every day | ORAL | Status: DC
  Administered 2021-02-08 – 2021-02-13 (×6): 50 mg via ORAL
  Filled 2021-02-08 (×6): qty 1

## 2021-02-08 MED ORDER — PANTOPRAZOLE SODIUM 40 MG PO TBEC
40.0000 mg | DELAYED_RELEASE_TABLET | Freq: Two times a day (BID) | ORAL | Status: DC
Start: 2021-02-08 — End: 2021-02-17
  Administered 2021-02-08 – 2021-02-17 (×18): 40 mg via ORAL
  Filled 2021-02-08 (×19): qty 1

## 2021-02-08 MED ORDER — ENZALUTAMIDE 80 MG PO TABS
80.0000 mg | ORAL_TABLET | Freq: Every day | ORAL | Status: DC
  Filled 2021-02-08: qty 1

## 2021-02-08 MED ORDER — HYDRALAZINE HCL 20 MG/ML IJ SOLN: 5.0000 mg | Freq: Four times a day (QID) | INTRAMUSCULAR | Status: DC | PRN

## 2021-02-08 MED ORDER — ACETAMINOPHEN 500 MG PO TABS
1000.0000 mg | ORAL_TABLET | Freq: Once | ORAL | Status: AC
  Administered 2021-02-08: 1000 mg via ORAL
  Filled 2021-02-08: qty 2

## 2021-02-08 MED ORDER — ONDANSETRON HCL 4 MG/2ML IJ SOLN
4.0000 mg | Freq: Four times a day (QID) | INTRAMUSCULAR | Status: DC | PRN
  Administered 2021-02-08: 4 mg via INTRAVENOUS
  Filled 2021-02-08: qty 2

## 2021-02-08 MED ORDER — CITALOPRAM HYDROBROMIDE 20 MG PO TABS
20.0000 mg | ORAL_TABLET | Freq: Every day | ORAL | Status: DC
  Administered 2021-02-09 – 2021-02-17 (×9): 20 mg via ORAL
  Filled 2021-02-08 (×9): qty 1

## 2021-02-08 MED ORDER — ROSUVASTATIN CALCIUM 10 MG PO TABS
5.0000 mg | ORAL_TABLET | Freq: Every day | ORAL | Status: DC
Start: 2021-02-08 — End: 2021-02-17
  Administered 2021-02-08 – 2021-02-16 (×9): 5 mg via ORAL
  Filled 2021-02-08 (×9): qty 1

## 2021-02-08 MED ORDER — METHOCARBAMOL 500 MG PO TABS
500.0000 mg | ORAL_TABLET | Freq: Once | ORAL | Status: AC
  Administered 2021-02-08: 500 mg via ORAL
  Filled 2021-02-08: qty 1

## 2021-02-08 MED ORDER — OXYCODONE HCL 5 MG PO TABS
5.0000 mg | ORAL_TABLET | ORAL | Status: DC | PRN
  Administered 2021-02-08 – 2021-02-16 (×15): 5 mg via ORAL
  Filled 2021-02-08 (×15): qty 1

## 2021-02-08 MED ORDER — ENOXAPARIN SODIUM 40 MG/0.4ML ~~LOC~~ SOLN
40.0000 mg | SUBCUTANEOUS | Status: DC
  Administered 2021-02-08 – 2021-02-16 (×9): 40 mg via SUBCUTANEOUS
  Filled 2021-02-08 (×9): qty 0.4

## 2021-02-08 MED ORDER — MORPHINE SULFATE (PF) 4 MG/ML IV SOLN
4.0000 mg | Freq: Once | INTRAVENOUS | Status: AC
Start: 2021-02-08 — End: 2021-02-08
  Administered 2021-02-08: 4 mg via INTRAVENOUS
  Filled 2021-02-08: qty 1

## 2021-02-08 MED ORDER — LIDOCAINE 5 % EX PTCH
1.0000 | MEDICATED_PATCH | CUTANEOUS | Status: DC
  Administered 2021-02-08 – 2021-02-17 (×10): 1 via TRANSDERMAL
  Filled 2021-02-08 (×10): qty 1

## 2021-02-08 MED ORDER — OXYCODONE HCL 5 MG PO TABS
5.0000 mg | ORAL_TABLET | Freq: Once | ORAL | Status: AC
  Administered 2021-02-08: 5 mg via ORAL
  Filled 2021-02-08: qty 1

## 2021-02-08 MED ORDER — RELUGOLIX 120 MG PO TABS
120.0000 mg | ORAL_TABLET | Freq: Every day | ORAL | Status: DC
  Administered 2021-02-09 – 2021-02-15 (×7): 120 mg via ORAL
  Filled 2021-02-08 (×14): qty 1

## 2021-02-08 MED ORDER — HYDROMORPHONE HCL 1 MG/ML IJ SOLN
0.5000 mg | INTRAMUSCULAR | Status: DC | PRN
  Administered 2021-02-08 – 2021-02-17 (×15): 0.5 mg via INTRAVENOUS
  Filled 2021-02-08 (×10): qty 0.5
  Filled 2021-02-08: qty 1
  Filled 2021-02-08 (×4): qty 0.5

## 2021-02-08 NOTE — H&P (Signed)
History and Physical  Torrance Stockley CVE:[id number] DOB: Jan 16, 1946 DOA: 02/08/2021  Referring physician: Dr Tamala Julian, [place] PCP: Venita Lick, NP  Outpatient Specialists: Hematology/oncology. Patient coming from: Home.    Chief Complaint: Fall this morning.  HPI: Anthony Figueroa is a 75 y.o. male with medical history significant for coronary artery disease status post CABG, essential hypertension, hyperlipidemia, paroxysmal A. 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  Reports he has had intermittent spells of dizziness for the past year or so with no falls except for today.  Woke up this morning around 7 AM to use the bathroom while he was standing urinating felt dizzy lost his balance and fell backwards.  Denies losing consciousness or hitting his head.  He was in his usual state of health prior to this.  He lives a sedentary lifestyle and uses a cane at his baseline.  For the past 2 days has noted left lower extremity edema and mild pain.  Work-up in the ED revealed right-sided 7th and 8th ribs fractures and right-sided trace apical pneumothorax.  O2 saturation 94% on room air.  EDP requested admission for pain control.    ED Course: BP 131/45, heart rate 55, respiratory rate 18, O2 saturation 94% on room air.  Lab studies remarkable for elevated creatinine 1.56.  Troponin -12.  Hemoglobin 11.8K from baseline of 13K about 3 months ago.  Review of Systems: Review of systems as noted in the HPI. All other systems reviewed and are negative.   Past Medical History:  Diagnosis Date  . Allergy   . Anxiety   . Arthritis   . Bleeding acute gastric ulcer   . Blood transfusion without reported  diagnosis   . Cataract   . CHF (congestive heart failure) ([place])   . Depression   . Heart murmur   . Hernia, inguinal   . Myocardial infarction ([place])   . Prostate cancer metastatic to bone ([place])   . Renal disorder   . Sleep apnea    Past Surgical History:  Procedure Laterality Date  . CHOLECYSTECTOMY    . CORONARY ARTERY BYPASS GRAFT    . EYE SURGERY    . Gastric by pass    . GASTRIC BYPASS      Social History:  reports that he quit smoking about 7 weeks ago. His smoking use included cigarettes. He smoked 0.10 packs per day. He has never used smokeless tobacco. He reports previous alcohol use. He reports that he does not use drugs.   Allergies  Allergen Reactions  . Metformin Swelling  . Mirtazapine Anxiety    Memory confusion Memory confusion Memory confusion Memory confusion   . Indocin [Indomethacin]   . Sitagliptin-Metformin Hcl Nausea And Vomiting    Family History  Problem Relation Age of Onset  . Cancer Mother   . Stroke Mother   . Heart disease Father   . Cancer Sister   . Heart disease Brother   . Hypertension Son   . Heart disease Brother   . Liver disease Brother   . Heart disease Brother   . Hypertension Son      Prior to Admission medications   Medication Sig Start Date End Date Taking? Authorizing Provider  acetaminophen (TYLENOL)  500 MG tablet Take by mouth.    [provider]  amLODipine (NORVASC) 2.5 MG tablet Take by mouth. 02/22/20   [provider]  aspirin 81 MG EC tablet Take by mouth.    [provider]  ergocalciferol (VITAMIN D2) 1.25 MG (50000 UT) capsule Take by mouth.    [provider]  furosemide (LASIX) 40 MG tablet Take by mouth. 12/05/12   [provider]  losartan (COZAAR) 100 MG tablet Take 100 mg by mouth daily. 11/14/20   [provider]  methylphenidate 27 MG PO CR tablet Take 27 mg by mouth every morning. 12/06/20   [provider]  omeprazole (PRILOSEC) 40 MG  capsule Take by mouth. 11/28/20 01/27/21  [provider]  oxyCODONE (OXY IR/ROXICODONE) 5 MG immediate release tablet Take by mouth. 12/19/20   [provider]  Relugolix 120 MG TABS Take by mouth. 12/20/20   [provider]  traZODone (DESYREL) 50 MG tablet Take by mouth. 05/21/18   [provider]    Physical Exam: BP (!) 123/48   Pulse (!) 51   Temp 97.9 F (36.6 C) (Oral)   Resp 18   Ht 5\' 11"  (1.803 m)   Wt 81.6 kg   SpO2 93%   BMI 25.10 kg/m   . General: 75 y.o. year-old male elderly and frail-appearing in no acute distress.  Alert and oriented x3. . Cardiovascular: Regular rate and rhythm with no rubs or gallops.  No thyromegaly or JVD noted.  2+ pitting edema in lower extremities bilaterally.  Left greater than right.  Tenderness with palpation of the left lower extremity.  Marland Kitchen Respiratory: Clear to auscultation with no wheezes or rales. Good inspiratory effort. . Abdomen: Soft nontender nondistended with normal bowel sounds x4 quadrants. . Muskuloskeletal: No cyanosis or clubbing noted bilaterally. 2+ pitting edema in lower extremities bilaterally.  Left greater than right.  Tenderness with palpation of the left lower extremity.  . Neuro: CN II-XII intact, strength, sensation, reflexes . Skin: No ulcerative lesions noted.  R arm 02/08/21  Right forearm from 02/08/2021. Marland Kitchen Psychiatry: Judgement and insight appear normal. Mood is appropriate for condition and setting          Labs on Admission:  Basic Metabolic Panel: Recent Labs  Lab 02/08/21 0837  NA 143  K 4.3  CL 111  CO2 26  GLUCOSE 102*  BUN 45*  CREATININE 1.56*  CALCIUM 7.8*   Liver Function Tests: Recent Labs  Lab 02/08/21 0837  AST 20  ALT 14  ALKPHOS 911*  BILITOT 0.8  PROT 4.5*  ALBUMIN 2.3*   No results for input(s): LIPASE, AMYLASE in the last 168 hours. No results for input(s): AMMONIA in the last 168 hours. CBC: Recent Labs  Lab 02/08/21 0837  WBC 7.5   NEUTROABS 5.3  HGB 11.8*  HCT 38.1*  MCV 93.2  PLT 174   Cardiac Enzymes: No results for input(s): CKTOTAL, CKMB, CKMBINDEX, TROPONINI in the last 168 hours.  BNP (last 3 results) No results for input(s): BNP in the last 8760 hours.  ProBNP (last 3 results) No results for input(s): PROBNP in the last 8760 hours.  CBG: No results for input(s): GLUCAP in the last 168 hours.  Radiological Exams on Admission: DG Chest 2 View  Result Date: 02/08/2021 CLINICAL DATA:  Pain following fall.  History of prostate carcinoma EXAM: CHEST - 2 VIEW COMPARISON:  None. FINDINGS: There is interstitial thickening bilaterally. No edema or consolidation evident. Heart  size and pulmonary vascular normal. Patient is status post aortic valve replacement and coronary artery bypass grafting. There is aortic atherosclerosis. There is a loop recorder on the left. Trace right apical pneumothorax fractures of the anterolateral right seventh and anterior right eighth ribs noted. Note mixed sclerotic lesions superimposed on osteoporosis noted, consistent with prostate carcinoma. No pleural effusions. IMPRESSION: Rib fractures on the right with trace right apical pneumothorax. Bony metastatic disease superimposed on osteoporosis. Interstitial thickening is likely due to underlying chronic bronchitis. No edema or airspace opacity. Heart size normal. Status post coronary artery bypass grafting and aortic valve replacement. Aortic Atherosclerosis (ICD10-I70.0). Critical Value/emergent results were called by telephone at the time of interpretation on 02/08/2021 at 9:21 am to provider [facility] , who verbally acknowledged these results. Electronically Signed   By: Lowella Grip III M.D.   On: 02/08/2021 09:22   DG Lumbar Spine Complete  Addendum Date: 02/08/2021   ADDENDUM REPORT: 02/08/2021 09:19 ADDENDUM: Note that there is extensive sclerotic bony metastatic disease consistent with known prostate carcinoma.  Electronically Signed   By: Lowella Grip III M.D.   On: 02/08/2021 09:19   Result Date: 02/08/2021 CLINICAL DATA:  Pain following fall EXAM: LUMBAR SPINE - COMPLETE 4+ VIEW COMPARISON:  None. FINDINGS: Frontal, lateral, spot lumbosacral lateral, and bilateral oblique views were obtained. There are 5 non-rib-bearing lumbar type vertebral bodies. There is no appreciable fracture or spondylolisthesis. There is disc space narrowing at L5-S1. Other disc spaces appear unremarkable. There is facet osteoarthritic change at L5-S1 bilaterally. Facets at other levels appear unremarkable. There are foci of aortic atherosclerosis. IMPRESSION: Osteoarthritic change at L5-S1. No fracture or spondylolisthesis. Aortic Atherosclerosis (ICD10-I70.0). Electronically Signed: By: Lowella Grip III M.D. On: 02/08/2021 09:15   CT Head Wo Contrast  Result Date: 02/08/2021 CLINICAL DATA:  Pain following fall. EXAM: CT HEAD WITHOUT CONTRAST TECHNIQUE: Contiguous axial images were obtained from the base of the skull through the vertex without intravenous contrast. COMPARISON:  None. FINDINGS: Brain: There is mild diffuse atrophy. There is no intracranial mass, hemorrhage, extra-axial fluid collection or midline shift. There is slight decreased attenuation in the centra semiovale bilaterally. No evident acute infarct. Vascular: No hyperdense vessel. There is slight carotid siphon arterial vascular calcification bilaterally. Skull: Bony calvarium appears intact. Sinuses/Orbits: There is opacification in several ethmoid air cells. There is a retention cyst in the lateral right sphenoid sinus posteriorly. Visualized orbits appear symmetric bilaterally. Other: Opacification noted in several mastoid air cells on the right. Visualized mastoids on the left are clear. IMPRESSION: Mild atrophy with slight periventricular small vessel disease. No mass or hemorrhage. No extra-axial fluid. No acute infarct. Areas of opacification in several  mastoid air cells on the right. Foci of paranasal sinus disease noted. Slight arterial vascular calcification. Electronically Signed   By: Lowella Grip III M.D.   On: 02/08/2021 09:24   CT Lumbar Spine Wo Contrast  Result Date: 02/08/2021 CLINICAL DATA:  Low back pain. Suspected pathologic fracture after fall. EXAM: CT LUMBAR SPINE WITHOUT CONTRAST TECHNIQUE: Multidetector CT imaging of the lumbar spine was performed without intravenous contrast administration. Multiplanar CT image reconstructions were also generated. COMPARISON:  None. FINDINGS: Segmentation: 5 lumbar type vertebrae based on the available coverage Alignment: Normal. Vertebrae: Innumerable sclerotic bony metastases throughout each visualized bone. L3 superior endplate fracture/Schmorl's node with chronic appearance. No acute fracture is seen. No visible extraosseous tumor growth Paraspinal and other soft tissues: Atheromatous calcification of the aorta and branch vessels. Subcutaneous reticulation in  the back without hematoma or soft tissue gas. Disc levels: Focal L5-S1 disc narrowing and endplate degeneration. Mild lower lumbar facet spurring. Small scattered endplate spurs. No compressive stenosis is seen. IMPRESSION: 1. No acute finding. 2. Widespread osseous metastatic disease. 3. Chronic appearing Schmorl's node/superior endplate fracture at L3 Electronically Signed   By: Monte Fantasia M.D.   On: 02/08/2021 11:16   CT PELVIS WO CONTRAST  Result Date: 02/08/2021 CLINICAL DATA:  Pelvic trauma EXAM: CT PELVIS WITHOUT CONTRAST TECHNIQUE: Multidetector CT imaging of the pelvis was performed following the standard protocol without intravenous contrast. COMPARISON:  None. FINDINGS: Urinary Tract:  Negative Bowel:  Extensive sigmoid diverticulosis.  Moderate stool retention Vascular/Lymphatic: Diffuse atheromatous calcification of the aorta and iliacs. Reproductive:  History of prostate cancer.  No gross asymmetry. Other:  No visible  ascites or pneumoperitoneum Musculoskeletal: Innumerable sclerotic bone metastases. No acute fracture. The hips are both located. Lumbar spine findings described separately. No visible extraosseous tumor growth. IMPRESSION: 1. No acute finding. 2. Extensive osseous metastatic disease without pathologic fracture or visible extraosseous tumor growth. Electronically Signed   By: Monte Fantasia M.D.   On: 02/08/2021 11:14    EKG: I independently viewed the EKG done and my findings are as followed: Not available at the time of this exam.  Assessment/Plan Present on Admission: . Fall  Active Problems:   Fall  Post fall secondary to dizziness. CT head, lumbar spine, pelvis without contrast negative for any acute findings.  However showed osseous metastatic disease.  Chronic appearing Schmorl's node/superior endplate fracture at L3. Denies loss of consciousness. Felt dizzy prior to falling. States he has had a work-up done for dizziness previously at outside institution but nothing significant was found. Obtain orthostatic vital signs. Fall precautions. PT OT to assess.  Right-sided 7th and 8th ribs fracture/right-sided apical trace pneumothorax Will obtain a CT chest to further assess, follow results. Maintain O2 saturation greater than 92%. Pain control.  Bilateral lower extremity edema, left greater than right. Left lower extremity tenderness with palpation. Rule out DVT Bilateral lower extremity duplex US, follow results.  CKD 3B Appears to be close to his baseline creatinine 1.44 GFR 51 Presented with creatinine of 1.56 with GFR 46. Avoid nephrotoxic agents He is currently on lisinopril and p.o. Lasix as needed, will continue for now Repeat renal panel in the morning. Closely monitor urine output and renal function while on ARB and diuretics.  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 Tessie Eke at [facility]. Resume home regimen, on po chemotherapy  X-tandi 80 mg daily, orgovyx 120 mg daily.  Coronary disease status post CABG Resume home regimen He is on aspirin, losartan, and p.o. Lasix PRN.  GERD Stable Resume home omeprazole.  DVT prophylaxis: Subcu Lovenox daily.  Code Status: Full code, reported by the patient himself.  Family Communication: His wife was at bedside.  Disposition Plan: Admit to [facility] with telemetry.  Consults called: None.  Admission status: Observation status.   Status is: Observation    Dispo:  Patient From: Home  Planned Disposition: Home  Anticipated discharge date 02/09/2021.  Medically stable for discharge: No, ongoing management of right-sided rib fractures and right-sided trace apical pneumothorax.      Kayleen Memos MD [facility] Pager [phone]  If 7PM-7AM, please contact night-coverage [email or web address] Password [facility]  02/08/2021, 12:56 PM

## 2021-02-08 NOTE — ED Provider Notes (Signed)
[facility] Emergency Department Provider Note ____________________________________________   Event Date/Time   First MD Initiated Contact with Patient 02/08/21 [phone]     (approximate)  I have reviewed the triage vital signs and the nursing notes.  HISTORY  Chief Complaint Fall   HPI Anthony Figueroa is a 75 y.o. Audrie Gallus presents to the ED for evaluation of fall and lumbar pain.   Chart review indicates history of HTN, GERD, CAD s/p remote CABG.  Resides on aspirin 81 without full anticoagulation. Recently started treatment for prostate cancer.  Patient presents to the ED for evaluation of back pain after a fall this morning.  He reports he typically ambulates with a cane at baseline, but was ambulating independently this morning when he got up from bed to void.  He reports feeling lightheaded dizziness upon standing, making it to the bathroom, feeling more faint and falling backwards.  Patient reports striking his lumbar back on the adjacent bathtub.  Patient reports he may have syncopized "but just for a second if anything."  Since the fall, patient reports 8/10 aching lumbar back pain radiating to his right hip.  He has not been able to get up and ambulate since this fall.  Denies recent illnesses or issues prior to this morning.  Past Medical History:  Diagnosis Date  . Allergy   . Anxiety   . Arthritis   . Bleeding acute gastric ulcer   . Blood transfusion without reported diagnosis   . Cataract   . CHF (congestive heart failure) ([place])   . Depression   . Heart murmur   . Hernia, inguinal   . Myocardial infarction ([place])   . Prostate cancer metastatic to bone ([place])   . Renal disorder   . Sleep apnea     Patient Active Problem List   Diagnosis Date Noted  . Fall 02/08/2021  . 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 PUD (peptic ulcer disease) 10/11/2020  . Paroxysmal atrial fibrillation ([place]) 02/22/2020  . Cigarette smoker  07/06/2019  . Bilateral carotid artery stenosis 01/05/2019  . Coronary artery disease involving native coronary artery of native heart without angina pectoris 01/05/2019  . Hx of CABG 01/05/2019  . Hyperlipidemia LDL goal <70 01/05/2019  . PVD (peripheral vascular disease) ([place]) 01/05/2019  . Prediabetes 01/05/2019  . History of aortic valve replacement 10/14/2015  . Essential hypertension 10/14/2015    Past Surgical History:  Procedure Laterality Date  . CHOLECYSTECTOMY    . CORONARY ARTERY BYPASS GRAFT    . EYE SURGERY    . Gastric by pass    . GASTRIC BYPASS      Prior to Admission medications   Medication Sig Start Date End Date Taking? Authorizing Provider  acetaminophen (TYLENOL) 500 MG tablet Take by mouth.    [provider]  amLODipine (NORVASC) 2.5 MG tablet Take by mouth. 02/22/20   [provider]  aspirin 81 MG EC tablet Take by mouth.    [provider]  ergocalciferol (VITAMIN D2) 1.25 MG (50000 UT) capsule Take by mouth.    [provider]  furosemide (LASIX) 40 MG tablet Take by mouth. 12/05/12   [provider]  losartan (COZAAR) 100 MG tablet Take 100 mg by mouth daily. 11/14/20   [provider]  methylphenidate 27 MG PO CR tablet Take 27 mg by mouth every morning. 12/06/20   [provider]  omeprazole (PRILOSEC) 40 MG capsule Take by mouth. 11/28/20 01/27/21  [provider]  oxyCODONE (OXY IR/ROXICODONE) 5 MG immediate release tablet Take by mouth. 12/19/20   [provider]  Relugolix 120 MG TABS Take by mouth. 12/20/20   [provider]  traZODone (DESYREL) 50 MG tablet Take by mouth. 05/21/18   [provider]    Allergies Metformin, Mirtazapine, Indocin [indomethacin], and Sitagliptin-metformin hcl  Family History  Problem Relation Age of Onset  . Cancer Mother   . Stroke Mother   . Heart disease Father   . Cancer Sister   . Heart disease Brother   .  Hypertension Son   . Heart disease Brother   . Liver disease Brother   . Heart disease Brother   . Hypertension Son     Social History Social History   Tobacco Use  . Smoking status: Former Smoker    Packs/day: 0.10    Types: Cigarettes    Quit date: 12/19/2020    Years since quitting: 0.1  . Smokeless tobacco: Never Used  Vaping Use  . Vaping Use: Never used  Substance Use Topics  . Alcohol use: Not Currently  . Drug use: Never    Review of Systems  Constitutional: No fever/chills.  Positive for lightheaded dizziness and fall with possible syncope. Eyes: No visual changes. ENT: No sore throat. Cardiovascular: Denies chest pain. Respiratory: Denies shortness of breath. Gastrointestinal: No abdominal pain.  No nausea, no vomiting.  No diarrhea.  No constipation. Genitourinary: Negative for dysuria. Musculoskeletal: Positive for back pain. Skin: Negative for rash. Neurological: Negative for headaches, focal weakness or numbness.  ____________________________________________   PHYSICAL EXAM:  VITAL SIGNS: Vitals:   02/08/21 1000 02/08/21 1100  BP: (!) 140/50 (!) 123/48  Pulse: (!) 49 (!) 51  Resp: 19 18  Temp:    SpO2: 93% 93%    Constitutional: Alert and oriented. Well appearing and in no acute distress. Eyes: Conjunctivae are normal. PERRL. EOMI. Head: Atraumatic. Nose: No congestion/rhinnorhea. Mouth/Throat: Mucous membranes are moist.  Oropharynx non-erythematous. Neck: No stridor. No cervical spine tenderness to palpation. Cardiovascular: Normal rate, regular rhythm. Grossly normal heart sounds.  Good peripheral circulation. Respiratory: Normal respiratory effort.  No retractions. Lungs CTAB. Gastrointestinal: Soft , nondistended, nontender to palpation. No CVA tenderness. Musculoskeletal: No lower extremity tenderness.  No joint effusions.  Pitting edema to bilateral lower extremities symmetrically without overlying skin changes or signs of acute trauma  to the lower extremities. Able to flex bilateral hips and knees without pain Difficulty sitting forward due to lumbar pain, and he has midline tenderness to palpation to his upper and mid lumbar spine without evidence of open injury. No cervical or thoracic tenderness to palpation. Neurologic:  Normal speech and language. No gross focal neurologic deficits are appreciated.  Skin:  Skin is warm, dry.  Couple acute skin tears to the right arm: one laterally to the upper arm, about 6cm in length and hemostatic.  One laterally to the proximal forearm, about 4-5 cm in length and hemostatic.  No discrete lacerations to necessitate repair.  Psychiatric: Mood and affect are normal. Speech and behavior are normal. ____________________________________________   LABS (all labs ordered are listed, but only abnormal results are displayed)  Labs Reviewed  COMPREHENSIVE METABOLIC PANEL - Abnormal; Notable for the following components:      Result Value   Glucose, Bld 102 (*)    BUN 45 (*)    Creatinine, Ser 1.56 (*)    Calcium 7.8 (*)    Total Protein 4.5 (*)  Albumin 2.3 (*)    Alkaline Phosphatase 911 (*)    GFR, Estimated 46 (*)    All other components within normal limits  CBC WITH DIFFERENTIAL/PLATELET - Abnormal; Notable for the following components:   RBC 4.09 (*)    Hemoglobin 11.8 (*)    HCT 38.1 (*)    RDW 15.9 (*)    Abs Immature Granulocytes 0.18 (*)    All other components within normal limits  URINALYSIS, COMPLETE (UACMP) WITH MICROSCOPIC - Abnormal; Notable for the following components:   Color, Urine YELLOW (*)    APPearance CLEAR (*)    All other components within normal limits  SARS CORONAVIRUS 2 (TAT 6-24 HRS)  TROPONIN I (HIGH SENSITIVITY)  TROPONIN I (HIGH SENSITIVITY)   ____________________________________________  12 Lead EKG  Sinus rhythm, rate of 53 bpm.  Normal axis and normal intervals.  No evidence of acute ischemia.  Sinus  bradycardia. ____________________________________________  RADIOLOGY  ED MD interpretation: 2 view CXR reviewed by me with right-sided inferior rib fractures and a small apical pneumothorax. CT head reviewed by me without evidence of acute intracranial pathology.  Official radiology report(s): DG Chest 2 View  Result Date: 02/08/2021 CLINICAL DATA:  Pain following fall.  History of prostate carcinoma EXAM: CHEST - 2 VIEW COMPARISON:  None. FINDINGS: There is interstitial thickening bilaterally. No edema or consolidation evident. Heart size and pulmonary vascular normal. Patient is status post aortic valve replacement and coronary artery bypass grafting. There is aortic atherosclerosis. There is a loop recorder on the left. Trace right apical pneumothorax fractures of the anterolateral right seventh and anterior right eighth ribs noted. Note mixed sclerotic lesions superimposed on osteoporosis noted, consistent with prostate carcinoma. No pleural effusions. IMPRESSION: Rib fractures on the right with trace right apical pneumothorax. Bony metastatic disease superimposed on osteoporosis. Interstitial thickening is likely due to underlying chronic bronchitis. No edema or airspace opacity. Heart size normal. Status post coronary artery bypass grafting and aortic valve replacement. Aortic Atherosclerosis (ICD10-I70.0). Critical Value/emergent results were called by telephone at the time of interpretation on 02/08/2021 at 9:21 am to provider [facility] , who verbally acknowledged these results. Electronically Signed   By: Lowella Grip III M.D.   On: 02/08/2021 09:22   DG Lumbar Spine Complete  Addendum Date: 02/08/2021   ADDENDUM REPORT: 02/08/2021 09:19 ADDENDUM: Note that there is extensive sclerotic bony metastatic disease consistent with known prostate carcinoma. Electronically Signed   By: Lowella Grip III M.D.   On: 02/08/2021 09:19   Result Date: 02/08/2021 CLINICAL DATA:  Pain following  fall EXAM: LUMBAR SPINE - COMPLETE 4+ VIEW COMPARISON:  None. FINDINGS: Frontal, lateral, spot lumbosacral lateral, and bilateral oblique views were obtained. There are 5 non-rib-bearing lumbar type vertebral bodies. There is no appreciable fracture or spondylolisthesis. There is disc space narrowing at L5-S1. Other disc spaces appear unremarkable. There is facet osteoarthritic change at L5-S1 bilaterally. Facets at other levels appear unremarkable. There are foci of aortic atherosclerosis. IMPRESSION: Osteoarthritic change at L5-S1. No fracture or spondylolisthesis. Aortic Atherosclerosis (ICD10-I70.0). Electronically Signed: By: Lowella Grip III M.D. On: 02/08/2021 09:15   CT Head Wo Contrast  Result Date: 02/08/2021 CLINICAL DATA:  Pain following fall. EXAM: CT HEAD WITHOUT CONTRAST TECHNIQUE: Contiguous axial images were obtained from the base of the skull through the vertex without intravenous contrast. COMPARISON:  None. FINDINGS: Brain: There is mild diffuse atrophy. There is no intracranial mass, hemorrhage, extra-axial fluid collection or midline shift. There is slight decreased attenuation  in the centra semiovale bilaterally. No evident acute infarct. Vascular: No hyperdense vessel. There is slight carotid siphon arterial vascular calcification bilaterally. Skull: Bony calvarium appears intact. Sinuses/Orbits: There is opacification in several ethmoid air cells. There is a retention cyst in the lateral right sphenoid sinus posteriorly. Visualized orbits appear symmetric bilaterally. Other: Opacification noted in several mastoid air cells on the right. Visualized mastoids on the left are clear. IMPRESSION: Mild atrophy with slight periventricular small vessel disease. No mass or hemorrhage. No extra-axial fluid. No acute infarct. Areas of opacification in several mastoid air cells on the right. Foci of paranasal sinus disease noted. Slight arterial vascular calcification. Electronically Signed    By: Lowella Grip III M.D.   On: 02/08/2021 09:24   CT Lumbar Spine Wo Contrast  Result Date: 02/08/2021 CLINICAL DATA:  Low back pain. Suspected pathologic fracture after fall. EXAM: CT LUMBAR SPINE WITHOUT CONTRAST TECHNIQUE: Multidetector CT imaging of the lumbar spine was performed without intravenous contrast administration. Multiplanar CT image reconstructions were also generated. COMPARISON:  None. FINDINGS: Segmentation: 5 lumbar type vertebrae based on the available coverage Alignment: Normal. Vertebrae: Innumerable sclerotic bony metastases throughout each visualized bone. L3 superior endplate fracture/Schmorl's node with chronic appearance. No acute fracture is seen. No visible extraosseous tumor growth Paraspinal and other soft tissues: Atheromatous calcification of the aorta and branch vessels. Subcutaneous reticulation in the back without hematoma or soft tissue gas. Disc levels: Focal L5-S1 disc narrowing and endplate degeneration. Mild lower lumbar facet spurring. Small scattered endplate spurs. No compressive stenosis is seen. IMPRESSION: 1. No acute finding. 2. Widespread osseous metastatic disease. 3. Chronic appearing Schmorl's node/superior endplate fracture at L3 Electronically Signed   By: Monte Fantasia M.D.   On: 02/08/2021 11:16   CT PELVIS WO CONTRAST  Result Date: 02/08/2021 CLINICAL DATA:  Pelvic trauma EXAM: CT PELVIS WITHOUT CONTRAST TECHNIQUE: Multidetector CT imaging of the pelvis was performed following the standard protocol without intravenous contrast. COMPARISON:  None. FINDINGS: Urinary Tract:  Negative Bowel:  Extensive sigmoid diverticulosis.  Moderate stool retention Vascular/Lymphatic: Diffuse atheromatous calcification of the aorta and iliacs. Reproductive:  History of prostate cancer.  No gross asymmetry. Other:  No visible ascites or pneumoperitoneum Musculoskeletal: Innumerable sclerotic bone metastases. No acute fracture. The hips are both located. Lumbar  spine findings described separately. No visible extraosseous tumor growth. IMPRESSION: 1. No acute finding. 2. Extensive osseous metastatic disease without pathologic fracture or visible extraosseous tumor growth. Electronically Signed   By: Monte Fantasia M.D.   On: 02/08/2021 11:14    ____________________________________________   PROCEDURES and INTERVENTIONS  Procedure(s) performed (including Critical Care):  .1-3 Lead EKG Interpretation Performed by: Vladimir Crofts, MD Authorized by: Vladimir Crofts, MD     Interpretation: normal     ECG rate:  58   ECG rate assessment: bradycardic     Rhythm: sinus bradycardia     Ectopy: none     Conduction: normal      Medications  lidocaine (LIDODERM) 5 % 1 patch (1 patch Transdermal Patch Applied 02/08/21 1146)  acetaminophen (TYLENOL) tablet 1,000 mg (1,000 mg Oral Given 02/08/21 0912)  oxyCODONE (Oxy IR/ROXICODONE) immediate release tablet 5 mg (5 mg Oral Given 02/08/21 0913)  methocarbamol (ROBAXIN) tablet 500 mg (500 mg Oral Given 02/08/21 1200)  morphine 4 MG/ML injection 4 mg (4 mg Intravenous Given 02/08/21 1234)    ____________________________________________   MDM / ED COURSE   75 year old male recently starting treatment for prostate cancer presents to the ED after  mechanical fall causing rib fractures and a small pneumothorax, requiring medical observation admission.  Normal vitals.  Exam with no neurovascular deficits or distress, but with significant lumbar tenderness to palpation causing a high suspicion for lumbar compression fracture.  He has no evidence of significant trauma to his extremities, beyond skin tears to his right upper extremity, but no deformity, discrete laceration or need for extremity imaging.  Plain films demonstrate couple rib fractures and a tiny pneumothorax on the right, but no indication for chest tube at this time.  He is not hypoxic and not tachycardic.  Follow-up CT of his pelvis and lumbar spine obtained  due to high suspicion for lumbar compression fracture despite negative x-rays, and these did not show any acute fractures or pathologic fractures.  Patient requiring multiple doses of analgesia, still with poorly controlled pain and refusing to ambulate due to discomfort.  We will discussed the case with hospitalist medicine for observation admission for pain control and monitoring of his pneumothorax.   Clinical Course as of 02/08/21 1249  Wed Feb 08, 2021  1007 Reassessed.  Wife now at the bedside.  Patient with continued pain, 8/10 intensity.  We discussed imaging so far with evidence of rib fractures and tiny pneumothorax in the right.  We discussed his continued lumbar pain, plain film without obvious fracture, and we decided to follow-up with CT imaging of his pelvis and lumbar back with additional analgesia. [DS]    Clinical Course User Index [DS] Vladimir Crofts, MD    ____________________________________________   FINAL CLINICAL IMPRESSION(S) / ED DIAGNOSES  Final diagnoses:  Fall, initial encounter  Closed fracture of multiple ribs of right side, initial encounter  Pneumothorax on right     ED Discharge Orders    None       Leandro Berkowitz Tamala Julian   Note:  This document was prepared using Dragon voice recognition software and may include unintentional dictation errors.   Vladimir Crofts, MD 02/08/21 [phone]

## 2021-02-08 NOTE — Progress Notes (Signed)
PT Cancellation Note  Patient Details Name: Anthony Figueroa MRN: [id number] DOB: Apr 23, 1946   Cancelled Treatment:    Reason Eval/Treat Not Completed: Other (comment) . Consult received and chart reviewed. Pt with pending imaging for possible DVT. Will hold at this time. Of note, + R rib fractures and small pneumothorax on R. Will re-attempt next date.  Teshaun Olarte 02/08/2021, 3:12 PM  Greggory Stallion, PT, DPT [phone]

## 2021-02-08 NOTE — ED Notes (Signed)
Pt at imaging.

## 2021-02-08 NOTE — ED Notes (Signed)
Pt presents to ED with c/o of having a fall PTA. Pt states he got up OOB to urinate and states he felt "slighty dizzy" prior to falling. Pt states when he fell he hit his R side/ribs and lower back of the bathtub. Pt also has a skin tear to R elbow and R lower FA. Pt denies hitting head. Pt denies LOC. Pt denies taking blood thinners. Pt is A&Ox4 at this time. Pt denies chest pain or SOB to this RN.

## 2021-02-08 NOTE — Progress Notes (Signed)
This 75 yo white male pt was admitted to [facility] from the ED s/p fall at home.  PMHx:  CAD with CABG, prostate cancer with mets to bone, essential HTN, HLD, A fib, aortic valve replacement, recently quit smoking. Pt with fractures to 7th and 8th right ribs and skin tears to arms bilat. VSS at the time of arrival.  Pt complaining of pain with movement to the bed.

## 2021-02-08 NOTE — Consult Note (Signed)
[place] Nurse Consult Note: Reason for Consult: Consult requested for right arm. Performed remotely after review of progress notes and photos in the EMR.  Pt fell prior to admission and has full thickness abrasions; red and moist with blood-tinged drainage Dressing procedure/placement/frequency: Topical treatment orders provided for bedside nurses to perform as follows to promote moist healing: Apply double-folded xeroform gauze to right arm wounds Q day, then cover with ABD pad and kerlex. Please re-consult if further assistance is needed.  Thank-you,  Julien Girt MSN, [place], [place], [place], [place]

## 2021-02-09 ENCOUNTER — Observation Stay: Payer: Medicare Other

## 2021-02-09 ENCOUNTER — Observation Stay
Admit: 2021-02-09 | Discharge: 2021-02-09 | Disposition: A | Discharge status: Home / Self Care | Payer: Medicare Other | Attending: Student | Admitting: Student

## 2021-02-09 DIAGNOSIS — Z515 Encounter for palliative care: Secondary | ICD-10-CM

## 2021-02-09 DIAGNOSIS — R630 Anorexia: Secondary | ICD-10-CM | POA: Diagnosis present

## 2021-02-09 DIAGNOSIS — N1832 Chronic kidney disease, stage 3b: Secondary | ICD-10-CM | POA: Diagnosis present

## 2021-02-09 DIAGNOSIS — M6281 Muscle weakness (generalized): Secondary | ICD-10-CM | POA: Diagnosis present

## 2021-02-09 DIAGNOSIS — I951 Orthostatic hypotension: Secondary | ICD-10-CM | POA: Diagnosis present

## 2021-02-09 DIAGNOSIS — Z79899 Other long term (current) drug therapy: Secondary | ICD-10-CM | POA: Diagnosis not present

## 2021-02-09 DIAGNOSIS — Z20822 Contact with and (suspected) exposure to covid-19: Secondary | ICD-10-CM | POA: Diagnosis present

## 2021-02-09 DIAGNOSIS — J9383 Other pneumothorax: Secondary | ICD-10-CM | POA: Diagnosis present

## 2021-02-09 DIAGNOSIS — K219 Gastro-esophageal reflux disease without esophagitis: Secondary | ICD-10-CM | POA: Diagnosis present

## 2021-02-09 DIAGNOSIS — Z9221 Personal history of antineoplastic chemotherapy: Secondary | ICD-10-CM | POA: Diagnosis not present

## 2021-02-09 DIAGNOSIS — W19XXXA Unspecified fall, initial encounter: Secondary | ICD-10-CM | POA: Diagnosis not present

## 2021-02-09 DIAGNOSIS — Z6825 Body mass index (BMI) 25.0-25.9, adult: Secondary | ICD-10-CM | POA: Diagnosis not present

## 2021-02-09 DIAGNOSIS — J9601 Acute respiratory failure with hypoxia: Secondary | ICD-10-CM | POA: Diagnosis present

## 2021-02-09 DIAGNOSIS — W19XXXD Unspecified fall, subsequent encounter: Secondary | ICD-10-CM | POA: Diagnosis not present

## 2021-02-09 DIAGNOSIS — Z7189 Other specified counseling: Secondary | ICD-10-CM | POA: Diagnosis not present

## 2021-02-09 DIAGNOSIS — J939 Pneumothorax, unspecified: Secondary | ICD-10-CM | POA: Diagnosis present

## 2021-02-09 DIAGNOSIS — I480 Paroxysmal atrial fibrillation: Secondary | ICD-10-CM | POA: Diagnosis present

## 2021-02-09 DIAGNOSIS — Z951 Presence of aortocoronary bypass graft: Secondary | ICD-10-CM | POA: Diagnosis not present

## 2021-02-09 DIAGNOSIS — M7989 Other specified soft tissue disorders: Secondary | ICD-10-CM | POA: Diagnosis present

## 2021-02-09 DIAGNOSIS — S2241XA Multiple fractures of ribs, right side, initial encounter for closed fracture: Secondary | ICD-10-CM | POA: Diagnosis present

## 2021-02-09 DIAGNOSIS — C7951 Secondary malignant neoplasm of bone: Secondary | ICD-10-CM

## 2021-02-09 DIAGNOSIS — Z952 Presence of prosthetic heart valve: Secondary | ICD-10-CM | POA: Diagnosis not present

## 2021-02-09 DIAGNOSIS — R54 Age-related physical debility: Secondary | ICD-10-CM | POA: Diagnosis present

## 2021-02-09 DIAGNOSIS — E785 Hyperlipidemia, unspecified: Secondary | ICD-10-CM | POA: Diagnosis present

## 2021-02-09 DIAGNOSIS — C61 Malignant neoplasm of prostate: Secondary | ICD-10-CM | POA: Diagnosis present

## 2021-02-09 DIAGNOSIS — Z66 Do not resuscitate: Secondary | ICD-10-CM

## 2021-02-09 DIAGNOSIS — I739 Peripheral vascular disease, unspecified: Secondary | ICD-10-CM | POA: Diagnosis present

## 2021-02-09 DIAGNOSIS — S2241XD Multiple fractures of ribs, right side, subsequent encounter for fracture with routine healing: Secondary | ICD-10-CM | POA: Diagnosis not present

## 2021-02-09 DIAGNOSIS — N179 Acute kidney failure, unspecified: Secondary | ICD-10-CM | POA: Diagnosis present

## 2021-02-09 DIAGNOSIS — I129 Hypertensive chronic kidney disease with stage 1 through stage 4 chronic kidney disease, or unspecified chronic kidney disease: Secondary | ICD-10-CM | POA: Diagnosis present

## 2021-02-09 DIAGNOSIS — W010XXA Fall on same level from slipping, tripping and stumbling without subsequent striking against object, initial encounter: Secondary | ICD-10-CM | POA: Diagnosis present

## 2021-02-09 DIAGNOSIS — R42 Dizziness and giddiness: Secondary | ICD-10-CM | POA: Diagnosis not present

## 2021-02-09 LAB — BASIC METABOLIC PANEL
Anion gap: 7 (ref 5–15)
BUN: 44 mg/dL — ABNORMAL HIGH (ref 8–23)
CO2: 24 mmol/L (ref 22–32)
Calcium: 8 mg/dL — ABNORMAL LOW (ref 8.9–10.3)
Chloride: 110 mmol/L (ref 98–111)
Creatinine, Ser: 1.44 mg/dL — ABNORMAL HIGH (ref 0.61–1.24)
GFR, Estimated: 51 mL/min — ABNORMAL LOW (ref >60)
Glucose, Bld: 113 mg/dL — ABNORMAL HIGH (ref 70–99)
Potassium: 4.8 mmol/L (ref 3.5–5.1)
Sodium: 141 mmol/L (ref 135–145)

## 2021-02-09 LAB — IRON AND TIBC
Iron: 22 ug/dL — ABNORMAL LOW (ref 45–182)
Saturation Ratios: 14 % — ABNORMAL LOW (ref 17.9–39.5)
TIBC: 162 ug/dL — ABNORMAL LOW (ref 250–450)
UIBC: 140 ug/dL

## 2021-02-09 LAB — CBC
HCT: 39.5 % (ref 39.0–52.0)
Hemoglobin: 12.6 g/dL — ABNORMAL LOW (ref 13.0–17.0)
MCH: 29.4 pg (ref 26.0–34.0)
MCHC: 31.9 g/dL (ref 30.0–36.0)
MCV: 92.1 fL (ref 80.0–100.0)
Platelets: 177 10*3/uL (ref 150–400)
RBC: 4.29 MIL/uL (ref 4.22–5.81)
RDW: 15.8 % — ABNORMAL HIGH (ref 11.5–15.5)
WBC: 7.3 10*3/uL (ref 4.0–10.5)
nRBC: 0 % (ref 0.0–0.2)

## 2021-02-09 LAB — PHOSPHORUS: Phosphorus: 4.5 mg/dL (ref 2.5–4.6)

## 2021-02-09 LAB — MAGNESIUM: Magnesium: 1.8 mg/dL (ref 1.7–2.4)

## 2021-02-09 LAB — VITAMIN B12: Vitamin B-12: 279 pg/mL (ref 180–914)

## 2021-02-09 LAB — FOLATE: Folate: 9.1 ng/mL (ref >5.9)

## 2021-02-09 LAB — VITAMIN D 25 HYDROXY (VIT D DEFICIENCY, FRACTURES): Vit D, 25-Hydroxy: 44.27 ng/mL (ref 30–100)

## 2021-02-09 MED ORDER — POLYETHYLENE GLYCOL 3350 17 G PO PACK
17.0000 g | PACK | Freq: Two times a day (BID) | ORAL | Status: DC
  Administered 2021-02-09 – 2021-02-17 (×13): 17 g via ORAL
  Filled 2021-02-09 (×17): qty 1

## 2021-02-09 MED ORDER — ENZALUTAMIDE 80 MG PO TABS
160.0000 mg | ORAL_TABLET | Freq: Every day | ORAL | Status: DC
  Administered 2021-02-09: 160 mg via ORAL
  Filled 2021-02-09 (×2): qty 2

## 2021-02-09 MED ORDER — BISACODYL 10 MG RE SUPP: 10.0000 mg | Freq: Every day | RECTAL | Status: DC | PRN

## 2021-02-09 MED ORDER — OXYCODONE HCL ER 10 MG PO T12A
10.0000 mg | EXTENDED_RELEASE_TABLET | Freq: Two times a day (BID) | ORAL | Status: DC
  Administered 2021-02-09 – 2021-02-17 (×17): 10 mg via ORAL
  Filled 2021-02-09 (×17): qty 1

## 2021-02-09 MED ORDER — SODIUM CHLORIDE 0.9 % IV SOLN: INTRAVENOUS | Status: DC

## 2021-02-09 MED ORDER — BISACODYL 5 MG PO TBEC: 10.0000 mg | DELAYED_RELEASE_TABLET | Freq: Every day | ORAL | Status: DC | PRN

## 2021-02-09 NOTE — Unmapped (Signed)
Hi,     Douglas Gray contacted the [employer] regarding the following:    - Wanted to inform that pt fell yesterday has been admitted at [facility]; has some broken ribs    Please contact Linda at [phone].    Thanks in advance,    [name]  [facility]   [phone]

## 2021-02-09 NOTE — Unmapped (Signed)
Returned call to [place]. States pt fell this morning and was taken to [facility]. He sustained a couple of broken ribs and small pneumothorax. States he is having a lot of pain and is admitted for pain control. They are talking about sending him to SNF for rehab upon discharge.She did take pt's Xtandi to the hospital so he could continue taking it.She wanted to let team know.    Let her know I will notify them and will call back with any recommendations. She will call back with any further questions or concerns.

## 2021-02-09 NOTE — Unmapped (Signed)
Called Linda to follow up. Let her know team would like pt to hold Xtandi(enzalutamide) until they see him again. Would like for him to continue Orgovyx (relugolix ).    She verbalizes understanding and will call back with any further needs.

## 2021-02-09 NOTE — Evaluation (Signed)
Physical Therapy Evaluation Patient Details Name: Anthony Figueroa MRN: [id number] DOB: 1946/07/02 Today's Date: 02/09/2021   History of Present Illness  Anthony Figueroa is a 75 y.o. male with medical history significant for coronary artery disease status post CABG, essential hypertension, hyperlipidemia, paroxysmal A. 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 Discussed with MD who cleared pt for participation with stable pneumothorax.  Clinical Impression  Pt is a pleasant 75 year old male who was admitted for fall with rib fractures. Pt performs bed mobility with max assist +2. Orthostatics taken during session due to dizziness symptoms (see OT note for details). Pt demonstrates deficits with strength/mobility/endurance/pain. Is currently not at baseline and is high risk for falls. Would benefit from skilled PT to address above deficits and promote optimal return to PLOF; recommend transition to STR upon discharge from acute hospitalization.     Follow Up Recommendations SNF    Equipment Recommendations  Hospital bed;Wheelchair (measurements PT) (hoyer lift)    Recommendations for Other Services       Precautions / Restrictions Precautions Precautions: Fall Precaution Comments: R rib fx and pneumothorax Restrictions Weight Bearing Restrictions: No      Mobility  Bed Mobility Overal bed mobility: Needs Assistance Bed Mobility: Supine to Sit;Sit to Supine     Supine to sit: Max assist;+2 for physical assistance;HOB elevated Sit to supine: Max assist;+2 for physical assistance   General bed mobility comments: needs  heavy assist and takes extended time. Extra assist for L LE due to pain. Once seated, able to sit for extended time, prior to dizziness and requesting to return back to bed.    Transfers                 General transfer comment: Pt deferred attempt - 9/10 back pain in sitting  Ambulation/Gait                Stairs            Wheelchair Mobility    Modified Rankin (Stroke Patients Only)       Balance Overall balance assessment: Needs assistance Sitting-balance support: Bilateral upper extremity supported;Feet supported Sitting balance-Leahy Scale: Fair Sitting balance - Comments: forward flexed posture                                     Pertinent Vitals/Pain Pain Assessment: 0-10 Pain Score: 9  Pain Location: back Pain Descriptors / Indicators: Discomfort;Dull;Grimacing Pain Intervention(s): Limited activity within patient's tolerance;Premedicated before session;Repositioned    Home Living Family/patient expects to be discharged to:: Private residence Living Arrangements: Spouse/significant other Available Help at Discharge: Family Type of Home: House Home Access: Level entry     Home Layout: One level Home Equipment: Environmental consultant - 2 wheels;Cane - single point;Shower seat;Grab bars - tub/shower Additional Comments: Wife reports no local chilcdren available to assist    Prior Function Level of Independence: Independent with assistive device(s)         Comments: Pt previously completed HHPT - was walking limited community distances c SPC. Wife reports pt did not  want to use AD in home and denies falls hx. Pt endorses over a year of persistent dizziness     Hand Dominance        Extremity/Trunk Assessment   Upper Extremity Assessment Upper Extremity Assessment: Defer to OT evaluation    Lower Extremity Assessment Lower Extremity Assessment: Generalized weakness (R LE grossly 3/5; L LE grossly 2-/5 limited by pain) RLE  Deficits / Details: Grossly 3/5 LLE Deficits / Details: Grossly 2-/5       Communication   Communication: HOH  Cognition Arousal/Alertness: Awake/alert Behavior During Therapy: Flat affect Overall Cognitive Status: Within Functional Limits for tasks assessed                                 General Comments: very particular, however able to follow commands. 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 ~ 10 reps with cga Other Exercises: Sitting balance performed with weight shifts. +2 for lateral scooting up towards EOB neededing assist for balance.   Assessment/Plan    PT Assessment Patient needs continued PT services  PT Problem List Decreased strength;Decreased activity tolerance;Decreased balance;Decreased mobility;Pain;Impaired sensation       PT Treatment Interventions Gait training;DME instruction;Therapeutic exercise;Balance training    PT Goals (Current goals can be found in the Care Plan section)  Acute Rehab PT Goals Patient Stated Goal: To improve pain PT Goal Formulation: With patient Time For Goal Achievement: 02/23/21 Potential to Achieve Goals: Good    Frequency Min 2X/week   Barriers to discharge        Co-evaluation PT/OT/SLP Co-Evaluation/Treatment: Yes Reason for Co-Treatment: Complexity of the patient's impairments (multi-system involvement);For patient/therapist safety;To address functional/ADL transfers PT goals addressed during session: Mobility/safety with mobility;Balance OT goals addressed during session: ADL's  and self-care;Strengthening/ROM       AM-PAC PT "6 Clicks" Mobility  Outcome Measure Help needed turning from your back to your side while in a flat bed without using bedrails?: A Lot Help needed moving from lying on your back to sitting on the side of a flat bed without using bedrails?: A Lot Help needed moving to and from a bed to a chair (including a wheelchair)?: Total Help needed standing up from a chair using your arms (e.g., wheelchair or bedside chair)?: Total Help needed to walk in hospital room?: Total Help needed climbing 3-5 steps with a railing? : Total 6 Click Score: 8    End of Session Equipment Utilized During Treatment: Oxygen Activity Tolerance: Patient limited by pain Patient left: in bed;with bed alarm set Nurse Communication: Mobility status PT Visit Diagnosis: Unsteadiness on feet (R26.81);Muscle weakness (generalized) (M62.81);History of falling (Z91.81);Difficulty in walking, not elsewhere classified (R26.2);Pain;Dizziness and giddiness (R42) Pain - Right/Left: Right (ribs and back)    Time: 9833-8250 PT Time Calculation (min) (ACUTE ONLY): 32 min   Charges:   PT Evaluation $PT Eval Moderate Complexity: 1 Mod PT Treatments $Therapeutic Exercise: 8-22 mins        Greggory Stallion, PT, DPT [phone]   Ray,Stephanie 02/09/2021, 4:37 PM

## 2021-02-09 NOTE — Evaluation (Signed)
Occupational Therapy Evaluation Patient Details Name: Anthony Figueroa MRN: [id number] DOB: [birth year] Today's Date: 02/09/2021    History of Present Illness Anthony Figueroa is a 75 y.o. male with medical history significant for coronary artery disease status post CABG, essential hypertension, hyperlipidemia, paroxysmal A. 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   Clinical Impression   [name] was seen for OT evaluation this date, overlapping with PT for safe mobility. Prior to hospital admission, pt was MOD I for mobility using SPC for limited community mobility. Pt lives with wife in level entry 1 level town home. Pt presents to acute OT demonstrating impaired ADL performance and functional mobility 2/2 pain, decreased activity tolerance, functional strength/ROM/balance deficits, and visual deficits. Pt reports recent memory deficits however is A&Ox4. Pt endorses "seeing bugs" that move slowly at his peripheral vision - he has seen them for ~2 weeks and wife was not aware.  Pt currently requires SETUP delf-feeding - pt denies appetite and only eats x2 bites of lunch. MAX A don B socks at bed level. CGA + BUE support static sitting EOB. Wife and pt given extensive education on pain mgmt, HEP, falls prevention, importance of mobility/nutrition for functional strengthening, DME recs, and d/c recs. Pt would benefit from skilled OT to address noted impairments and functional limitations (see below for any additional details) in order to maximize safety and independence while minimizing falls risk and caregiver burden. Upon hospital discharge,  recommend STR to maximize pt safety and return to PLOF.  SUPINE: BP 86/58, HR 71, SpO2 91% on 1L [place] SEATED: BP 100/64, HR 81, SpO2 95% on RA    Follow Up Recommendations  SNF    Equipment Recommendations  3 in 1 bedside commode    Recommendations for Other Services       Precautions / Restrictions Precautions Precautions: Fall Restrictions Weight Bearing Restrictions: No      Mobility Bed Mobility Overal bed mobility: Needs Assistance Bed Mobility: Supine to Sit;Sit to Supine     Supine to sit: Max assist;+2 for physical assistance;HOB elevated Sit to supine: Max assist;+2 for physical assistance        Transfers                 General transfer comment: Pt deferred attempt - 9/10 back pain in sitting    Balance Overall balance assessment: Needs assistance Sitting-balance support: Bilateral upper extremity supported;Feet supported Sitting balance-Leahy Scale: Fair                                     ADL either performed or assessed with clinical judgement   ADL Overall ADL's : Needs assistance/impaired                                       General ADL Comments: SETUP delf-feeding - pt denies appetite and only eats x2 bites of lunch. MAX A don B socks at bed level. CGA + BUE support static sitting EOB.  Vision   Vision Assessment?: Yes Tracking/Visual Pursuits: Decreased smoothness of vertical tracking Additional Comments: impaired peripheral vision            Pertinent Vitals/Pain Pain Assessment: 0-10 Pain Score: 9  Pain Location: back Pain Descriptors / Indicators: Discomfort;Dull;Grimacing Pain Intervention(s): Repositioned;Limited activity within patient's tolerance     Hand Dominance     Extremity/Trunk Assessment Upper Extremity Assessment Upper Extremity Assessment: Generalized weakness   Lower Extremity Assessment Lower Extremity Assessment: LLE deficits/detail;RLE deficits/detail RLE  Deficits / Details: Grossly 3/5 LLE Deficits / Details: Grossly 2-/5       Communication Communication Communication: HOH   Cognition Arousal/Alertness: Awake/alert Behavior During Therapy: Flat affect Overall Cognitive Status: Within Functional Limits for tasks assessed                                 General Comments: Pt reports recent memory deficits however is A&Ox4. Pt endorses "seeing bugs" that move slowly at his peripheral vision - he has seen them for ~2 weeks and wife was not aware.   General Comments  SUPINE: BP 86/58, HR 71, SpO2 91% on 1L [place]. SEATED: BP 100/64, HR 81, SpO2 95% on RA    Exercises Exercises: Other exercises Other Exercises Other Exercises: Pt and family educated re: OT role, DME recs, d/c recs, falls prevention, ECS, pain mgmt, HEP Other Exercises: LBD, self-drinking/feeding, sup<>sit, sit<>stand, sitting/standing balance/tolerance   Shoulder Instructions      Home Living Family/patient expects to be discharged to:: Private residence Living Arrangements: Spouse/significant other Available Help at Discharge: Family Type of Home: House (townhome) Home Access: Level entry     Home Layout: One level     Bathroom Shower/Tub: Walk-in Psychologist, prison and probation services: Handicapped height     Home Equipment: Environmental consultant - 2 wheels;Cane - single point;Shower seat;Grab bars - tub/shower   Additional Comments: Wife reports no local chilcdren available to assist      Prior Functioning/Environment Level of Independence: Independent with assistive device(s)        Comments: Pt previously completed HHPT - was walking limited community distances c SPC. Wife reports pt did not want to use AD in home and denies falls hx. Pt endorses over a year of persistent dizziness        OT Problem List: Decreased strength;Decreased range of motion;Decreased activity tolerance;Impaired balance (sitting and/or standing);Decreased safety awareness      OT  Treatment/Interventions: Self-care/ADL training;Therapeutic exercise;Energy conservation;DME and/or AE instruction;Therapeutic activities;Visual/perceptual remediation/compensation;Patient/family education;Balance training    OT Goals(Current goals can be found in the care plan section) Acute Rehab OT Goals Patient Stated Goal: To improve pain OT Goal Formulation: With patient/family Time For Goal Achievement: 02/23/21 Potential to Achieve Goals: Fair ADL Goals Pt Will Perform Grooming: with set-up;with supervision;sitting Pt Will Perform Lower Body Dressing: bed level;with min assist Pt Will Transfer to Toilet: with mod assist;with +2 assist;stand pivot transfer;bedside commode (c LRAD PRN)  OT Frequency: Min 1X/week   Barriers to D/C: Inaccessible home environment;Decreased caregiver support             AM-PAC OT "6 Clicks" Daily Activity     Outcome Measure Help from another person eating meals?: A Little Help from another person taking care of personal grooming?: A Little Help from another person toileting, which includes using toliet, bedpan, or urinal?: A Lot Help from another person bathing (including washing, rinsing, drying)?: A Lot Help from another person  to put on and taking off regular upper body clothing?: A Lot Help from another person to put on and taking off regular lower body clothing?: A Lot 6 Click Score: 14   End of Session Nurse Communication: Mobility status  Activity Tolerance: Patient tolerated treatment well;Patient limited by pain Patient left: in bed;with call bell/phone within reach;with bed alarm set;with family/visitor present  OT Visit Diagnosis: Unsteadiness on feet (R26.81);Other abnormalities of gait and mobility (R26.89)                Time: 2481-8590 OT Time Calculation (min): 60 min Charges:  OT General Charges $OT Visit: 1 Visit OT Evaluation $OT Eval Moderate Complexity: 1 Mod OT Treatments $Self Care/Home Management : 38-52  mins  Dessie Coma, M.S. OTR/L  02/09/21, 3:09 PM  ascom [phone]

## 2021-02-09 NOTE — Progress Notes (Signed)
*  PRELIMINARY RESULTS* Echocardiogram 2D Echocardiogram has been performed.  Anthony Figueroa 02/09/2021, 1:39 PM

## 2021-02-09 NOTE — Consult Note (Signed)
Consultation Note Date: 02/09/2021   Patient Name: Anthony Figueroa  DOB: Mar 29, 1946  MRN: [id number]  Age / Sex: 75 y.o., male  PCP: Venita Lick, NP Referring Physician: Val Riles, MD  Reason for Consultation: Establishing goals of care  HPI/Patient Profile: 75 y.o. male  with past medical history of CAD s/p CABG, HTN, HLD, a fib not on AC, PUD, bovine AVR, arrhythmia with implantable loop recorder, and recently diagnosed prostate cancer with mets to bones (dx Jan 2022) on oral chemo for 6 weeks present admitted on 02/08/2021 with a fall. Found to have right-sided7th and 8th ribsfractures and right-sided trace apical pneumothorax. Patient reports ongoing dizziness - previously had workup with no significant findings. Also with bilateral lower ext edema - left > right - bilat lower ext [place] neg for DVT. PMT consulted for [place] discussion.   Clinical Assessment and Goals of Care: I have reviewed medical records including EPIC notes, labs and imaging, assessed the patient and then met with patient to discuss diagnosis prognosis, GOC, EOL wishes, disposition and options.  I introduced Palliative Medicine as specialized medical care for people living with serious illness. It focuses on providing relief from the symptoms and stress of a serious illness. The goal is to improve quality of life for both the patient and the family.  Patient tells me of worsening confusion lately, memory loss.   Without prompting he shares with me that he has made the decision for DNR - he tells me he did not include his family in this decision because it is his to make and he did not want to burden his family with the decision. He asks me more about what this decision means - we discuss how a DNR affects care - will continue with all other measures and care options but he will be allowed a natural death when his body passes away. He expresses understanding. He asks if he can  change his mind later and I tell him he can.   I share with him the need for further goals of care discussion. He agrees this is a conversation he would like to have, but he would like his wife to be here in person. He agrees to let me call her and schedule a meeting for tomorrow for further [place] discussion.   Questions and concerns were addressed. The family was encouraged to call with questions or concerns.   Primary Decision Maker PATIENT   SUMMARY OF RECOMMENDATIONS   Code status DNR Ongoing GOC of care discussions - plan to meet with patient and wife together tomorrow at 10 AM  Code Status/Advance Care Planning:  DNR   Symptom Management:   Pain: oxycontin 10 mg BID, oxy IR 5 mg q 4 hr PRN, 0.5 mg dilaudid q3hr PRN IV  Miralax BID, senna qhs  Trazodone at bedtime  Discharge Planning: To Be Determined      Primary Diagnoses: Present on Admission: . Fall . Pneumothorax, right   I have reviewed the medical record, interviewed the patient and family, and examined the patient. The following aspects are pertinent.  Past Medical History:  Diagnosis Date  . Allergy   . Anxiety   . Arthritis   . Bleeding acute gastric ulcer   . Blood transfusion without reported diagnosis   . Cataract   . CHF (congestive heart failure) ([place])   . Depression   . Heart murmur   . Hernia, inguinal   . Myocardial infarction ([place])   . Prostate cancer metastatic  to bone ([facility])   . Renal disorder   . Sleep apnea    Social History   Socioeconomic History  . Marital status: Married    Spouse name: Not on file  . Number of children: Not on file  . Years of education: Not on file  . Highest education level: Not on file  Occupational History  . Not on file  Tobacco Use  . Smoking status: Former Smoker    Packs/day: 0.10    Types: Cigarettes    Quit date: 12/19/2020    Years since quitting: 0.1  . Smokeless tobacco: Never Used  Vaping Use  . Vaping Use: Never used  Substance and  Sexual Activity  . Alcohol use: Not Currently  . Drug use: Never  . Sexual activity: Not on file  Other Topics Concern  . Not on file  Social History Narrative  . Not on file   Social Determinants of Health   Financial Resource Strain: Not on file  Food Insecurity: Not on file  Transportation Needs: Not on file  Physical Activity: Not on file  Stress: Not on file  Social Connections: Not on file   Family History  Problem Relation Age of Onset  . Cancer Mother   . Stroke Mother   . Heart disease Father   . Cancer Sister   . Heart disease Brother   . Hypertension Son   . Heart disease Brother   . Liver disease Brother   . Heart disease Brother   . Hypertension Son    Scheduled Meds: . citalopram  20 mg Oral Daily  . enoxaparin (LOVENOX) injection  40 mg Subcutaneous Q24H  . enzalutamide  160 mg Oral Daily  . lidocaine  1 patch Transdermal Q24H  . oxyCODONE  10 mg Oral BID  . pantoprazole  40 mg Oral BID  . polyethylene glycol  17 g Oral BID  . Relugolix  120 mg Oral Daily  . rosuvastatin  5 mg Oral QHS  . senna-docusate  1 tablet Oral QHS  . traZODone  50 mg Oral QHS   Continuous Infusions: . sodium chloride 75 mL/hr at 02/09/21 0939   PRN Meds:.bisacodyl, bisacodyl, hydrALAZINE, HYDROmorphone (DILAUDID) injection, ondansetron (ZOFRAN) IV, oxyCODONE Allergies  Allergen Reactions  . Metformin Swelling  . Mirtazapine Anxiety    Memory confusion Memory confusion Memory confusion Memory confusion   . Indocin [Indomethacin]   . Sitagliptin-Metformin Hcl Nausea And Vomiting   Review of Systems  Constitutional: Positive for fatigue.  Neurological: Positive for dizziness and weakness.       Forgetful    Physical Exam Constitutional:      General: He is not in acute distress. Pulmonary:     Effort: Pulmonary effort is normal.  Skin:    General: Skin is warm and dry.  Neurological:     Mental Status: He is alert and oriented to person, place, and time.      Vital Signs: BP 108/60 (BP Location: Left Wrist)   Pulse 75   Temp 98 F (36.7 C) (Oral)   Resp 16   Ht 5' 10.98" (1.803 m)   Wt 81.6 kg   SpO2 93%   BMI 25.12 kg/m  Pain Scale: 0-10 POSS *See Group Information*: 1-Acceptable,Awake and alert Pain Score: 10-Worst pain ever   SpO2: SpO2: 93 % O2 Device:SpO2: 93 % O2 Flow Rate: .O2 Flow Rate (L/min): 2 L/min  IO: Intake/output summary:   Intake/Output Summary (Last 24 hours) at 02/09/2021 1426 Last  data filed at 02/09/2021 1400 Gross per 24 hour  Intake 120 ml  Output 150 ml  Net -30 ml    LBM: Last BM Date: 02/08/21 Baseline Weight: Weight: 81.6 kg Most recent weight: Weight: 81.6 kg     Palliative Assessment/Data: PPS 50%    Time Total: 35 minutes Greater than 50%  of this time was spent counseling and coordinating care related to the above assessment and plan.  Juel Burrow, DNP, AGNP-C Palliative Medicine Team [phone] Pager: [phone]

## 2021-02-09 NOTE — Plan of Care (Signed)
Patient is alert and oriented x4. V/S stable. On oxygen at 2lpm/[place] with oxygen sat at 94%. Reports severe back pain and ribcage on the right side. Dilaudid IV q3 and Oxycodone q 4 as needed for pain with short term relief. Dressing on right arm clean, dry and intact. Bed alarms kept in place.

## 2021-02-09 NOTE — Progress Notes (Signed)
[facility] Progress Note  Patient: Anthony Figueroa    GDJ:[id number]  DOA: 02/08/2021     Date of Service: the patient was seen and examined on 02/09/2021  Chief Complaint  Patient presents with  . Fall   Brief hospital course: Darus Hershman is a 75 y.o. male with medical history significant for coronary artery disease status post CABG, essential hypertension, hyperlipidemia, paroxysmal A. 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    Work-up in the ED revealed right-sided 7th and 8th ribs fractures and right-sided trace apical pneumothorax.  O2 saturation 94% on room air.  EDP requested admission for pain control.    ED Course: BP 131/45, heart rate 55, respiratory rate 18, O2 saturation 94% on room air.  Lab studies remarkable for elevated creatinine 1.56.  Troponin -12.  Hemoglobin 11.8K from baseline of 13K about 3 months ago.   Assessment and Plan:   Post fall secondary to dizziness. CT head, lumbar spine, pelvis without contrast negative for any acute findings.  However showed osseous metastatic disease.  Chronic appearing Schmorl's node/superior endplate fracture at L3. Denies loss of consciousness. Felt dizzy prior to falling. States he has had a work-up done for dizziness previously at outside institution but nothing significant was found. Obtain orthostatic vital signs. Fall precautions. PT OT to assess.  Right-sided 7th and 8th ribs fracture/right-sided apical trace pneumothorax Will obtain a CT chest to further assess, follow results. Maintain O2 saturation greater than 92%. Pain control. 3/17 CXR  Stable right apical pneumothorax. Several rib fractures on the right. Underlying sclerotic bony metastases.   Bilateral lower extremity edema, left greater than right. Left lower extremity tenderness with palpation. Rule out DVT Bilateral lower extremity duplex US negative for DVT Patient is a little bit hypoxic but not tachycardic, we will continue to watch, consider CTA chest to rule out PE if there is strong suspicion   CKD 3B Appears to be close to his baseline creatinine 1.44 GFR 51 Presented with creatinine of 1.56 with GFR 46. Avoid nephrotoxic agents He is currently on lisinopril and p.o. Lasix as needed, will continue for now Repeat renal panel in the morning. Closely monitor urine output and renal function while on ARB and diuretics.  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 Tessie Eke at [facility]. Resume home regimen, on po chemotherapy X-tandi 80 mg daily, orgovyx 120 mg daily.  Coronary disease status post CABG Resume home regimen He is on aspirin, losartan, and p.o. Lasix PRN.  GERD Stable Resume home omeprazole.    Body mass index is 25.12 kg/m.  Interventions:        Diet: Heart healthy DVT Prophylaxis: Subcutaneous Lovenox   Advance goals of care discussion: DNR  Family Communication: family was NOT present at bedside, at the time of interview.  The pt provided permission to discuss medical plan with the family. Opportunity was given to ask question and all questions were answered satisfactorily.   Disposition:  Pt is from Home, admitted with fall, right seventh and eighth rib fracture, severe pain, still has severe pain, unable to ambulate, at high risk for frequent falls, PT  eval pending, which precludes a safe discharge. Discharge to SNF, when pain is under control, PT eval will be done.  Subjective: Patient was admitted due to fall and had right-sided rib fractures and left hip pain.  Still having severe  pain, patient is in distress secondary to severe pain and asking for more pain medications. Patient denied any other active issues.  Physical Exam: General:  alert oriented to time, place, and person.  Appear in moderate distress, affect anxious Eyes: PERRLA ENT: Oral Mucosa Clear, moist  Neck: no JVD,  Cardiovascular: S1 and S2 Present, no Murmur,  Respiratory: good respiratory effort, Bilateral Air entry equal and Decreased, no Crackles, no wheezes Abdomen: Bowel Sound present, Soft and no tenderness,  Skin: no rashes Extremities: mild  Pedal edema, no calf tenderness Neurologic: without any new focal findings Gait not checked due to patient safety concerns  Vitals:   02/08/21 2354 02/09/21 0409 02/09/21 0732 02/09/21 1119  BP: (!) 154/56 (!) 145/54 (!) 99/50 108/60  Pulse: 65 67 75 75  Resp: 18 18 16 16   Temp: 97.9 F (36.6 C) 97.8 F (36.6 C) 98.3 F (36.8 C) 98 F (36.7 C)  TempSrc: Oral Tympanic Oral Oral  SpO2: 100% 96% 94% 93%  Weight:      Height:        Intake/Output Summary (Last 24 hours) at 02/09/2021 1213 Last data filed at 02/09/2021 0500 Gross per 24 hour  Intake --  Output 150 ml  Net -150 ml   Filed Weights   02/08/21 0827  Weight: 81.6 kg    Data Reviewed: I have personally reviewed and interpreted daily labs, tele strips, imagings as discussed above. I reviewed all nursing notes, pharmacy notes, vitals, pertinent old records I have discussed plan of care as described above with RN and patient/family.  CBC: Recent Labs  Lab 02/08/21 0837 02/09/21 0416  WBC 7.5 7.3  NEUTROABS 5.3  --   HGB 11.8* 12.6*  HCT 38.1* 39.5  MCV 93.2 92.1  PLT 174 397   Basic Metabolic Panel: Recent Labs  Lab 02/08/21 0837 02/09/21 0416  NA 143 141  K 4.3 4.8  CL 111 110  CO2 26 24  GLUCOSE 102* 113*  BUN 45* 44*  CREATININE 1.56* 1.44*  CALCIUM 7.8* 8.0*  MG  --  1.8  PHOS  --  4.5    Studies: CT CHEST WO CONTRAST  Result Date:  02/08/2021 CLINICAL DATA:  Chest pain.  Fall.  History of prostate carcinoma EXAM: CT CHEST WITHOUT CONTRAST TECHNIQUE: Multidetector CT imaging of the chest was performed following the standard protocol without IV contrast. COMPARISON:  Chest radiograph February 08, 2021 FINDINGS: Cardiovascular: There is no thoracic aortic aneurysm. There is calcification in multiple visualized great vessels. There is calcification in the thoracic aorta. There is an aortic valve replacement. There are multiple foci of coronary artery calcification. No pericardial effusion or pericardial thickening evident. Mediastinum/Nodes: Thyroid appears normal. Several subcentimeter mediastinal lymph nodes evident. No adenopathy by size criteria appreciable. There is a small hiatal hernia. Lungs/Pleura: There is a small pneumothorax on the right seen apically and anteriorly without tension component. There is a fairly small pleural effusion on the right with consolidation in each lung base, more on the right than on the left. There is a degree of underlying centrilobular emphysematous change. Trachea and major bronchial structures appear patent. Upper Abdomen: There is upper abdominal aortic atherosclerosis. Gallbladder is absent. There is a 4 x 2 mm calculus in  the mid right kidney. Visualized upper abdominal structures otherwise appear normal. Musculoskeletal: There is widespread sclerotic bony metastatic disease throughout the thorax. There is a comminuted fracture of the posterior right ninth rib. There are displaced fractures of the right posterior sixth, seventh, and eighth ribs as well. Status post median sternotomy. No chest wall lesions. IMPRESSION: 1. Small pneumothorax confirmed on the right without tension component. 2. Several displaced rib fractures on the right posteriorly and posterolaterally. 3. Widespread sclerotic bony metastatic disease consistent with known prostate carcinoma. 4. Small right pleural effusion. Ill-defined  atelectasis and consolidation in the posterior lung bases. Underlying centrilobular emphysema. 5. Extensive aortic atherosclerosis. Foci of coronary artery calcification noted. Status post aortic valve replacement. 6.  Small hiatal hernia. 7.  Gallbladder absent. 8.  Nonobstructing 4 x 2 mm calculus mid right kidney. Aortic Atherosclerosis (ICD10-I70.0) and Emphysema (ICD10-J43.9). Electronically Signed   By: Lowella Grip III M.D.   On: 02/08/2021 13:47   US Venous Img Lower Bilateral (DVT)  Result Date: 02/08/2021 CLINICAL DATA:  Bilateral lower extremity edema. EXAM: Bilateral LOWER EXTREMITY VENOUS DOPPLER ULTRASOUND TECHNIQUE: Gray-scale sonography with compression, as well as color and duplex ultrasound, were performed to evaluate the deep venous system(s) from the level of the common femoral vein through the popliteal and proximal calf veins. COMPARISON:  None. FINDINGS: VENOUS Normal compressibility of the common femoral, superficial femoral, and popliteal veins, as well as the visualized calf veins. Visualized portions of profunda femoral vein and great saphenous vein unremarkable. No filling defects to suggest DVT on grayscale or color Doppler imaging. Doppler waveforms show normal direction of venous flow, normal respiratory plasticity and response to augmentation. Limited views of the contralateral common femoral vein are unremarkable. OTHER None. Limitations: none IMPRESSION: No evidence of deep venous thrombosis in LEFT or RIGHT lower extremity. Electronically Signed   By: Suzy Bouchard M.D.   On: 02/08/2021 16:23   DG Chest Port 1 View  Result Date: 02/09/2021 CLINICAL DATA:  Chest pain and shortness of breath. Fall. History of prostate carcinoma EXAM: PORTABLE CHEST 1 VIEW COMPARISON:  Chest radiograph and chest CT February 08, 2021 FINDINGS: Small right apical pneumothorax is stable. Several rib fractures noted on the right. There is mild right base atelectasis. No edema or airspace  opacity. Patient is status post coronary artery bypass grafting and aortic valve replacement. Heart size and pulmonary vascularity are normal. Loop recorder on the left. There is aortic atherosclerosis. No appreciable adenopathy. Widespread sclerotic bony metastases noted. IMPRESSION: Stable right apical pneumothorax. Several rib fractures on the right. Underlying sclerotic bony metastases. Mild right base atelectasis. No edema or airspace opacity. Stable cardiac silhouette with postoperative changes. Aortic Atherosclerosis (ICD10-I70.0). Electronically Signed   By: Lowella Grip III M.D.   On: 02/09/2021 08:42    Scheduled Meds: . citalopram  20 mg Oral Daily  . enoxaparin (LOVENOX) injection  40 mg Subcutaneous Q24H  . enzalutamide  80 mg Oral Daily  . lidocaine  1 patch Transdermal Q24H  . oxyCODONE  10 mg Oral BID  . pantoprazole  40 mg Oral BID  . Relugolix  120 mg Oral Daily  . rosuvastatin  5 mg Oral QHS  . senna-docusate  1 tablet Oral QHS  . traZODone  50 mg Oral QHS   Continuous Infusions: . sodium chloride 75 mL/hr at 02/09/21 0939   PRN Meds: hydrALAZINE, HYDROmorphone (DILAUDID) injection, ondansetron (ZOFRAN) IV, oxyCODONE  Time spent: 35 minutes  Author: Val Riles. MD [facility] 02/09/2021 12:13  PM  To reach On-call, see care teams to locate the attending and reach out to them via [email or web address]. If 7PM-7AM, please contact night-coverage If you still have difficulty reaching the attending provider, please page the [facility] (Director on Call) for [facility] on amion for assistance.

## 2021-02-10 ENCOUNTER — Inpatient Hospital Stay: Payer: Medicare Other

## 2021-02-10 DIAGNOSIS — Z7189 Other specified counseling: Secondary | ICD-10-CM | POA: Diagnosis not present

## 2021-02-10 DIAGNOSIS — C61 Malignant neoplasm of prostate: Secondary | ICD-10-CM | POA: Diagnosis not present

## 2021-02-10 DIAGNOSIS — Z515 Encounter for palliative care: Secondary | ICD-10-CM | POA: Diagnosis not present

## 2021-02-10 DIAGNOSIS — W19XXXA Unspecified fall, initial encounter: Secondary | ICD-10-CM | POA: Diagnosis not present

## 2021-02-10 DIAGNOSIS — M6281 Muscle weakness (generalized): Secondary | ICD-10-CM | POA: Diagnosis not present

## 2021-02-10 DIAGNOSIS — Z66 Do not resuscitate: Secondary | ICD-10-CM | POA: Diagnosis not present

## 2021-02-10 LAB — CBC
HCT: 33.9 % — ABNORMAL LOW (ref 39.0–52.0)
Hemoglobin: 11 g/dL — ABNORMAL LOW (ref 13.0–17.0)
MCH: 29.6 pg (ref 26.0–34.0)
MCHC: 32.4 g/dL (ref 30.0–36.0)
MCV: 91.4 fL (ref 80.0–100.0)
Platelets: 146 10*3/uL — ABNORMAL LOW (ref 150–400)
RBC: 3.71 MIL/uL — ABNORMAL LOW (ref 4.22–5.81)
RDW: 15.9 % — ABNORMAL HIGH (ref 11.5–15.5)
WBC: 6.5 10*3/uL (ref 4.0–10.5)
nRBC: 0 % (ref 0.0–0.2)

## 2021-02-10 LAB — BASIC METABOLIC PANEL
Anion gap: 5 (ref 5–15)
BUN: 49 mg/dL — ABNORMAL HIGH (ref 8–23)
CO2: 24 mmol/L (ref 22–32)
Calcium: 8 mg/dL — ABNORMAL LOW (ref 8.9–10.3)
Chloride: 110 mmol/L (ref 98–111)
Creatinine, Ser: 1.75 mg/dL — ABNORMAL HIGH (ref 0.61–1.24)
GFR, Estimated: 40 mL/min — ABNORMAL LOW (ref >60)
Glucose, Bld: 96 mg/dL (ref 70–99)
Potassium: 5 mmol/L (ref 3.5–5.1)
Sodium: 139 mmol/L (ref 135–145)

## 2021-02-10 LAB — ECHOCARDIOGRAM COMPLETE
AR max vel: 2.11 cm2
AV Area VTI: 1.92 cm2
AV Area mean vel: 2.13 cm2
AV Mean grad: 3 mmHg
AV Peak grad: 4.8 mmHg
Ao pk vel: 1.09 m/s
Area-P 1/2: 2.5 cm2
Height: 70.984 in
S' Lateral: 2.33 cm
Weight: 2880 oz

## 2021-02-10 LAB — MAGNESIUM: Magnesium: 1.9 mg/dL (ref 1.7–2.4)

## 2021-02-10 LAB — PHOSPHORUS: Phosphorus: 4.3 mg/dL (ref 2.5–4.6)

## 2021-02-10 MED ORDER — POLYSACCHARIDE IRON COMPLEX 150 MG PO CAPS
150.0000 mg | ORAL_CAPSULE | Freq: Every day | ORAL | Status: DC
  Administered 2021-02-10 – 2021-02-17 (×8): 150 mg via ORAL
  Filled 2021-02-10 (×8): qty 1

## 2021-02-10 MED ORDER — ASCORBIC ACID 500 MG PO TABS
500.0000 mg | ORAL_TABLET | Freq: Every day | ORAL | Status: DC
  Administered 2021-02-10 – 2021-02-17 (×8): 500 mg via ORAL
  Filled 2021-02-10 (×8): qty 1

## 2021-02-10 MED ORDER — SODIUM CHLORIDE 0.9 % IV SOLN: INTRAVENOUS | Status: DC

## 2021-02-10 MED ORDER — CYANOCOBALAMIN 1000 MCG/ML IJ SOLN
1000.0000 ug | Freq: Every day | INTRAMUSCULAR | Status: AC
  Administered 2021-02-10 – 2021-02-14 (×5): 1000 ug via INTRAMUSCULAR
  Filled 2021-02-10 (×5): qty 1

## 2021-02-10 NOTE — Progress Notes (Signed)
[facility] Progress Note  Patient: Anthony Figueroa    JIR:[id number]  DOA: 02/08/2021     Date of Service: the patient was seen and examined on 02/10/2021  Chief Complaint  Patient presents with  . Fall   Brief hospital course: Anthony Figueroa is a 75 y.o. male with medical history significant for coronary artery disease status post CABG, essential hypertension, hyperlipidemia, paroxysmal A. 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 ED after a fall at home  early morning 7 AM when he went to the bathroom and was standing and felt dizzy and fell backwards.    Work-up in the ED revealed right-sided 7th and 8th ribs fractures and right-sided trace apical pneumothorax.  O2 saturation 94% on room air.  EDP requested admission for pain control.    ED Course: BP 131/45, heart rate 55, respiratory rate 18, O2 saturation 94% on room air.  Lab studies remarkable for elevated creatinine 1.56.  Troponin -12.  Hemoglobin 11.8K from baseline of 13K about 3 months ago.   Assessment and Plan:   Post fall secondary to dizziness. CT head, lumbar spine, pelvis without contrast negative for any acute findings.  However showed osseous metastatic disease.  Chronic appearing Schmorl's node/superior endplate fracture at L3. Denies loss of consciousness. Felt dizzy prior to falling. States he has had a work-up done for dizziness previously at outside institution but nothing significant was found. Obtain orthostatic vital signs. Fall precautions. PT OT to assess.  Right-sided 7th and 8th ribs fracture/right-sided apical trace pneumothorax 3/16 CT chest reviewed,  Maintain O2 saturation greater than 92%. Pain control. 3/17 CXR Stable right apical pneumothorax.  Several rib fractures on the right. Underlying sclerotic bony metastases. 3/18 CXR Previously noted pneumothorax is no longer confidently identified.  Bilateral lower extremity edema, left greater than right. Left lower extremity tenderness with palpation. Rule out DVT Bilateral lower extremity duplex US negative for DVT Patient is a little bit hypoxic but not tachycardic, we will continue to watch, consider CTA chest to rule out PE if there is strong suspicion   AKI on CKD 3B Appears to be close to his baseline creatinine 1.44 GFR 51 Presented with creatinine of 1.56 with GFR 46. Avoid nephrotoxic agents He is currently on lisinopril and p.o. Lasix as needed, will continue for now Repeat renal panel in the morning. Closely monitor urine output and renal function while on ARB and diuretics. 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 Tessie Eke at [facility]. Pt's wife stated to RN that they were told to stop chemotherapy X-tandi which was d/c;d on 3/18 and continued orgovyx 120 mg daily.   Coronary disease status post CABG Resume home regimen He is on aspirin, losartan, and p.o. Lasix PRN.  GERD Stable Resume home omeprazole.    Body mass index is 25.12 kg/m.  Interventions:        Diet: Heart healthy DVT Prophylaxis: Subcutaneous Lovenox   Advance goals of care discussion: DNR  Family Communication: family was NOT present at bedside, at the time of interview.  The pt provided permission to discuss medical plan with the family. Opportunity was given to ask question and all questions were answered satisfactorily.   Disposition:  Pt is from  Home, admitted with fall, right seventh and eighth rib fracture, severe pain, still has severe pain, unable to ambulate, at high risk for frequent falls, PT eval pending, which precludes a safe discharge. Discharge to SNF, when pain is under control, PT eval  will be done. Palliative care consulted for goals of care discussion  Subjective: No acute event overnight issues, patient is having pain in the right-sided chest wall due to fractures, left hip pain has been resolved at rest but it still hurts on movement.  Patient denied any other active issues.  Physical Exam: General:  alert oriented to time, place, and person.  Appear in moderate distress, affect anxious Eyes: PERRLA ENT: Oral Mucosa Clear, moist  Neck: no JVD,  Cardiovascular: S1 and S2 Present, no Murmur,  Respiratory: good respiratory effort, Bilateral Air entry equal and Decreased, no Crackles, no wheezes Abdomen: Bowel Sound present, Soft and no tenderness,  Skin: no rashes Extremities: mild  Pedal edema, no calf tenderness Neurologic: without any new focal findings Gait not checked due to patient safety concerns  Vitals:   02/09/21 2330 02/10/21 0448 02/10/21 0749 02/10/21 1144  BP:  (!) 140/57 (!) 162/59 140/76  Pulse:  65 60 63  Resp:  16 18 17   Temp:  (!) 97.4 F (36.3 C) 98.5 F (36.9 C) 97.8 F (36.6 C)  TempSrc:   Oral Oral  SpO2: 95% 94% 95% 93%  Weight:      Height:        Intake/Output Summary (Last 24 hours) at 02/10/2021 1449 Last data filed at 02/10/2021 1402 Gross per 24 hour  Intake 2531.88 ml  Output 180 ml  Net 2351.88 ml   Filed Weights   02/08/21 0827  Weight: 81.6 kg    Data Reviewed: I have personally reviewed and interpreted daily labs, tele strips, imagings as discussed above. I reviewed all nursing notes, pharmacy notes, vitals, pertinent old records I have discussed plan of care as described above with RN and patient/family.  CBC: Recent Labs  Lab 02/08/21 0837 02/09/21 0416 02/10/21 0413  WBC 7.5 7.3 6.5  NEUTROABS 5.3  --   --   HGB 11.8* 12.6* 11.0*  HCT 38.1* 39.5 33.9*  MCV 93.2 92.1 91.4  PLT 174 177 379*   Basic Metabolic Panel: Recent Labs  Lab 02/08/21 0837 02/09/21 0416 02/10/21 0413  NA 143 141 139  K  4.3 4.8 5.0  CL 111 110 110  CO2 26 24 24   GLUCOSE 102* 113* 96  BUN 45* 44* 49*  CREATININE 1.56* 1.44* 1.75*  CALCIUM 7.8* 8.0* 8.0*  MG  --  1.8 1.9  PHOS  --  4.5 4.3    Studies: US RENAL  Result Date: 02/10/2021 CLINICAL DATA:  Acute renal insufficiency. History of prostate cancer. EXAM: RENAL / URINARY TRACT ULTRASOUND COMPLETE COMPARISON:  CT pelvis 02/08/2021. FINDINGS: Right Kidney: Renal measurements: 9.0 x 4.8 x 4.8 cm = volume: 108 mL. Echogenicity is within normal limits. No mass or hydronephrosis. An 8 mm shadowing echogenic stone is seen. Left Kidney: Renal measurements: 7.5 x 4.6 x 3.6 cm = volume: 65 mL. Echogenicity within normal limits. No mass or hydronephrosis visualized. There may be cortical scarring. Bladder: Contour abnormality of the right bladder is likely transient given normal appearance on 02/08/2021. Ureteral jets are identified. Prevoid volume of 132 cc. Patient was unable to void. Other: None. IMPRESSION: No acute findings. Electronically Signed   By: Lorin Picket M.D.   On: 02/10/2021 12:12  DG Chest Port 1 View  Result Date: 02/10/2021 CLINICAL DATA:  75 year old male with history of right-sided pneumothorax. Follow-up study. EXAM: PORTABLE CHEST 1 VIEW COMPARISON:  Chest x-ray 02/09/2021. FINDINGS: Lung volumes are low. No definite residual pneumothorax confidently identified. Bibasilar opacities (right greater than left), which may reflect areas of atelectasis and/or consolidation. No definite pleural effusions. No evidence of pulmonary edema. Heart size is normal. Upper mediastinal contours are within normal limits. Atherosclerotic calcifications in the thoracic aorta. Status post median sternotomy. Electronic device projecting over the heart, likely an implantable pacemaker device. Innumerable sclerotic lesions noted throughout the visualized skeleton indicative of widespread metastatic disease. IMPRESSION: 1. Previously noted pneumothorax is no longer  confidently identified. 2. Low lung volumes with bibasilar (right greater than left) areas of atelectasis and/or consolidation. 3. Aortic atherosclerosis. 4. Diffuse skeletal metastatic disease. Electronically Signed   By: Vinnie Langton M.D.   On: 02/10/2021 11:29    Scheduled Meds: . vitamin C  500 mg Oral Daily  . citalopram  20 mg Oral Daily  . cyanocobalamin  1,000 mcg Intramuscular Daily  . enoxaparin (LOVENOX) injection  40 mg Subcutaneous Q24H  . iron polysaccharides  150 mg Oral Daily  . lidocaine  1 patch Transdermal Q24H  . oxyCODONE  10 mg Oral BID  . pantoprazole  40 mg Oral BID  . polyethylene glycol  17 g Oral BID  . Relugolix  120 mg Oral Daily  . rosuvastatin  5 mg Oral QHS  . senna-docusate  1 tablet Oral QHS  . traZODone  50 mg Oral QHS   Continuous Infusions: . sodium chloride 75 mL/hr at 02/10/21 0824   PRN Meds: bisacodyl, bisacodyl, hydrALAZINE, HYDROmorphone (DILAUDID) injection, ondansetron (ZOFRAN) IV, oxyCODONE  Time spent: 35 minutes  Author: Val Riles. MD [facility] 02/10/2021 2:49 PM  To reach On-call, see care teams to locate the attending and reach out to them via [email or web address]. If 7PM-7AM, please contact night-coverage If you still have difficulty reaching the attending provider, please page the [facility] (Director on Call) for [facility] on amion for assistance.

## 2021-02-10 NOTE — Progress Notes (Signed)
[facility]) Hospital Liaison RN note:  Received new referral for [facility] out patient palliative program to follow post discharge from Kathie Rhodes, NP. Patient information given to referral. Dreama Saa, TOC made aware. Plan is to discharge to SNF. [place] Liaison will follow for disposition.  Thank you for this referral.  Zandra Abts, RN [facility] Liaison [phone] .

## 2021-02-10 NOTE — Plan of Care (Signed)

## 2021-02-10 NOTE — TOC Initial Note (Signed)
Transition of Care [facility]) - Initial/Assessment Note    Patient Details  Name: Lowry Bala MRN: [id number] Date of Birth: [birth year]  Transition of Care [facility]) CM/SW Contact:    Pete Pelt, RN Phone Number: 02/10/2021, 1:28 PM  Clinical Narrative:                TOC in to see patient at bedside, wife present.  [place] of visit: Discharge planning.  Patient and spouse amenable to SNF for rehab, prefer [facility] but CM explained we can try, but cannot guarantee a bed there, all verbally understood.  When at home, patient would drive himself to appointments, concern for wife now is patient strength after rehab as she cannot lift him but will be able to drive to appts-CM explained that SNF rehab would work with him and monitor his progress and discussions will occur periodically at that time.  No concerns with getting medications after discharge.  Medicare ratings list of SNF given to patient and wife at bedside to assist them with navigation, as CM explained that we cannot make recommendations.  No further questions or concerns at this time, TOC contact information given, TOC will follow through discharge.  Expected Discharge Plan: Skilled Nursing Facility Barriers to Discharge: Continued Medical Work up   Patient Goals and CMS Choice   CMS Medicare.gov Compare Post Acute Care list provided to:: Patient Choice offered to / list presented to : [facility]  Expected Discharge Plan and Services Expected Discharge Plan: [place] In-house Referral: NA   Post Acute Care Choice: [place] Living arrangements for the past 2 months: Single Family Home                         Representative spoke with at DME Agency: n/a         Representative spoke with at [place]: n/a  Prior Living Arrangements/Services Living arrangements for the past 2 months: [place] Lives with:: Spouse Patient language and need for interpreter reviewed:: Yes Do you feel  safe going back to the place where you live?: Yes      Need for Family Participation in Patient Care: Yes (Comment) Care giver support system in place?: Yes (comment)   Criminal Activity/Legal Involvement Pertinent to Current Situation/Hospitalization: No - Comment as needed  Activities of Daily Living Home Assistive Devices/Equipment: None ADL Screening (condition at time of admission) Patient's cognitive ability adequate to safely complete daily activities?: Yes Is the patient deaf or have difficulty hearing?: No Does the patient have difficulty seeing, even when wearing glasses/contacts?: No Does the patient have difficulty concentrating, remembering, or making decisions?: No Patient able to express need for assistance with ADLs?: Yes Does the patient have difficulty dressing or bathing?: No Independently performs ADLs?: No Communication: Independent Dressing (OT): Needs assistance Is this a change from baseline?: Change from baseline, expected to last <3days Grooming: Independent Feeding: Independent Bathing: Needs assistance Is this a change from baseline?: Change from baseline, expected to last <3 days Toileting: Needs assistance Is this a change from baseline?: Change from baseline, expected to last >3days In/Out Bed: Needs assistance Is this a change from baseline?: Change from baseline, expected to last <3 days Walks in Home: Needs assistance Is this a change from baseline?: Change from baseline, expected to last <3 days Does the patient have difficulty walking or climbing stairs?: Yes Weakness of Legs: None Weakness of Arms/Hands: None  Permission Sought/Granted   Permission granted to  share information with : Yes, Verbal Permission Granted     Permission granted to share info w AGENCY: SNF for rehab placement        Emotional Assessment Appearance:: Appears stated age Attitude/Demeanor/Rapport: Gracious,Engaged Affect (typically observed):  Pleasant,Appropriate,Calm Orientation: : Oriented to Self,Oriented to Place,Oriented to  Time,Oriented to Situation Alcohol / Substance Use: Not Applicable Psych Involvement: No (comment)  Admission diagnosis:  Pain [R52] Fall [W19.XXXA] Bilateral leg edema [R60.0] Pneumothorax on right [J93.9] Fall, initial encounter [W19.XXXA] Closed fracture of multiple ribs of right side, initial encounter [S22.41XA] Pneumothorax, right [J93.9] Patient Active Problem List   Diagnosis Date Noted  . Pneumothorax, right 02/09/2021  . Fall 02/08/2021  . 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 PUD (peptic ulcer disease) 10/11/2020  . Paroxysmal atrial fibrillation ([place]) 02/22/2020  . Cigarette smoker 07/06/2019  . Bilateral carotid artery stenosis 01/05/2019  . Coronary artery disease involving native coronary artery of native heart without angina pectoris 01/05/2019  . Hx of CABG 01/05/2019  . Hyperlipidemia LDL goal <70 01/05/2019  . PVD (peripheral vascular disease) ([place]) 01/05/2019  . Prediabetes 01/05/2019  . History of aortic valve replacement 10/14/2015  . Essential hypertension 10/14/2015   PCP:  Venita Lick, NP Pharmacy:   [id number] [place], [address] [place] [address] Phone: [phone] Fax: [phone]  [facility] [place] [place] [facility] [address] [place] [address] Phone: [phone] Fax: [phone]     Social Determinants of Health (SDOH) Interventions    Readmission Risk Interventions No flowsheet data found.

## 2021-02-10 NOTE — NC FL2 (Signed)
[place] LEVEL OF CARE SCREENING TOOL     IDENTIFICATION  Patient Name: Anthony Figueroa Birthdate: [birth year] Sex: male Admission Date (Current Location): 02/08/2021  [place] and [place] Number:  Engineering geologist and Address:  [facility], [address], [place], [place] [address]      Provider Number: [id number]  Attending Physician Name and Address:  Val Riles, MD  Relative Name and Phone Number:  jaequan, propes (Spouse)   [phone] [facility])    Current Level of Care: Hospital Recommended Level of Care: [place] Prior Approval Number:    Date Approved/Denied:   PASRR Number: [id number]  Discharge Plan: SNF    Current Diagnoses: Patient Active Problem List   Diagnosis Date Noted  . Pneumothorax, right 02/09/2021  . Fall 02/08/2021  . 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 PUD (peptic ulcer disease) 10/11/2020  . Paroxysmal atrial fibrillation ([place]) 02/22/2020  . Cigarette smoker 07/06/2019  . Bilateral carotid artery stenosis 01/05/2019  . Coronary artery disease involving native coronary artery of native heart without angina pectoris 01/05/2019  . Hx of CABG 01/05/2019  . Hyperlipidemia LDL goal <70 01/05/2019  . PVD (peripheral vascular disease) ([place]) 01/05/2019  . Prediabetes 01/05/2019  . History of aortic valve replacement 10/14/2015  . Essential hypertension 10/14/2015    Orientation RESPIRATION BLADDER Height & Weight     Self,Time,Situation,Place  Normal External catheter Weight: 81.6 kg Height:  5' 10.98" (180.3 cm)  BEHAVIORAL SYMPTOMS/MOOD NEUROLOGICAL BOWEL NUTRITION STATUS      Continent Diet (heart healthy)  AMBULATORY STATUS COMMUNICATION OF NEEDS Skin   Extensive Assist Verbally Other (Comment) (Skin tear right lower arm with foam, skin tear right upper arm with guaze abd)                       Personal Care Assistance Level of Assistance   Bathing,Feeding,Dressing Bathing Assistance: Maximum assistance Feeding assistance: Independent (Needs to be set up and can eat independently) Dressing Assistance: Maximum assistance     Functional Limitations Info  Sight,Hearing,Speech Sight Info: Adequate Hearing Info: Adequate Speech Info: Adequate    SPECIAL CARE FACTORS FREQUENCY  PT (By licensed PT),OT (By licensed OT)     PT Frequency: 5x Weekly OT Frequency: 5x Weekly            Contractures Contractures Info: Not present    Additional Factors Info  Code Status,Allergies Code Status Info: DNR Allergies Info: Metformin, Mirtazapine,,Indocin (Indomethacin), Sitagliptin-metformin Hcl           Current Medications (02/10/2021):  This is the current hospital active medication list Current Facility-Administered Medications  Medication Dose Route Frequency Provider Last Rate Last Admin  . 0.9 %  sodium chloride infusion   Intravenous Continuous Val Riles, MD 75 mL/hr at 02/10/21 0824 New Bag at 02/10/21 2637  . ascorbic acid (VITAMIN C) tablet 500 mg  500 mg Oral Daily Val Riles, MD   500 mg at 02/10/21 1134  . bisacodyl (DULCOLAX) EC tablet 10 mg  10 mg Oral Daily PRN Val Riles, MD      . bisacodyl (DULCOLAX) suppository 10 mg  10 mg Rectal Daily PRN Val Riles, MD      . citalopram (CELEXA) tablet 20 mg  20 mg Oral Daily [place], Archie Patten N, DO   20 mg at 02/10/21 1135  . cyanocobalamin ((VITAMIN B-12)) injection 1,000 mcg  1,000 mcg Intramuscular Daily Val Riles, MD   1,000 mcg at  02/10/21 1135  . enoxaparin (LOVENOX) injection 40 mg  40 mg Subcutaneous Q24H Irene Pap N, DO   40 mg at 02/09/21 2031  . hydrALAZINE (APRESOLINE) injection 5 mg  5 mg Intravenous Q6H PRN Irene Pap N, DO      . HYDROmorphone (DILAUDID) injection 0.5 mg  0.5 mg Intravenous Q3H PRN Irene Pap N, DO   0.5 mg at 02/10/21 0436  . iron polysaccharides (NIFEREX) capsule 150 mg  150 mg Oral Daily Val Riles, MD   150 mg at  02/10/21 1134  . lidocaine (LIDODERM) 5 % 1 patch  1 patch Transdermal Q24H Vladimir Crofts, MD   1 patch at 02/10/21 1141  . ondansetron (ZOFRAN) injection 4 mg  4 mg Intravenous Q6H PRN Irene Pap N, DO   4 mg at 02/08/21 1532  . oxyCODONE (Oxy IR/ROXICODONE) immediate release tablet 5 mg  5 mg Oral Q4H PRN Irene Pap N, DO   5 mg at 02/10/21 1134  . oxyCODONE (OXYCONTIN) 12 hr tablet 10 mg  10 mg Oral BID Val Riles, MD   10 mg at 02/10/21 0824  . pantoprazole (PROTONIX) EC tablet 40 mg  40 mg Oral BID Irene Pap N, DO   40 mg at 02/10/21 1141  . polyethylene glycol (MIRALAX / GLYCOLAX) packet 17 g  17 g Oral BID Val Riles, MD   17 g at 02/10/21 1136  . Relugolix TABS 120 mg  120 mg Oral Daily Val Riles, MD   120 mg at 02/09/21 1424  . rosuvastatin (CRESTOR) tablet 5 mg  5 mg Oral QHS [place], Carole N, DO   5 mg at 02/09/21 2028  . senna-docusate (Senokot-S) tablet 1 tablet  1 tablet Oral QHS Irene Pap N, DO   1 tablet at 02/09/21 2028  . traZODone (DESYREL) tablet 50 mg  50 mg Oral QHS Irene Pap N, DO   50 mg at 02/09/21 2033     Discharge Medications: Please see discharge summary for a list of discharge medications.  Relevant Imaging Results:  Relevant Lab Results:   Additional Information SSN [id number]  Patient may have follow up appointments at [facility] for oral chemo  Pete Pelt, RN

## 2021-02-10 NOTE — Progress Notes (Signed)
Daily Progress Note   Patient Name: Anthony Figueroa       Date: 02/10/2021 DOB: Apr 02, 1946  Age: 75 y.o. MRN#: [id number] Attending Physician: Val Riles, MD Primary Care Physician: Venita Lick, NP Admit Date: 02/08/2021  Reason for Consultation/Follow-up: Establishing goals of care  Subjective: Feels a little stronger than yesterday, no specific complaints. Pain controlled.   Length of Stay: 1  Current Medications: Scheduled Meds:  . vitamin C  500 mg Oral Daily  . citalopram  20 mg Oral Daily  . cyanocobalamin  1,000 mcg Intramuscular Daily  . enoxaparin (LOVENOX) injection  40 mg Subcutaneous Q24H  . iron polysaccharides  150 mg Oral Daily  . lidocaine  1 patch Transdermal Q24H  . oxyCODONE  10 mg Oral BID  . pantoprazole  40 mg Oral BID  . polyethylene glycol  17 g Oral BID  . Relugolix  120 mg Oral Daily  . rosuvastatin  5 mg Oral QHS  . senna-docusate  1 tablet Oral QHS  . traZODone  50 mg Oral QHS    Continuous Infusions: . sodium chloride 75 mL/hr at 02/10/21 0824    PRN Meds: bisacodyl, bisacodyl, hydrALAZINE, HYDROmorphone (DILAUDID) injection, ondansetron (ZOFRAN) IV, oxyCODONE  Physical Exam Constitutional:      General: He is not in acute distress. Pulmonary:     Effort: Pulmonary effort is normal.  Skin:    General: Skin is warm and dry.  Neurological:     Mental Status: He is alert and oriented to person, place, and time.     Comments: Some forgetfulness, confusion, and word searching but overall is oriented and understands situation             Vital Signs: BP 140/76 (BP Location: Left Arm)   Pulse 63   Temp 97.8 F (36.6 C) (Oral)   Resp 17   Ht 5' 10.98" (1.803 m)   Wt 81.6 kg   SpO2 93%   BMI 25.12 kg/m  SpO2: SpO2: 93 % O2 Device: O2 Device:  Nasal Cannula O2 Flow Rate: O2 Flow Rate (L/min): 3 L/min  Intake/output summary:   Intake/Output Summary (Last 24 hours) at 02/10/2021 1442 Last data filed at 02/10/2021 1402 Gross per 24 hour  Intake 2531.88 ml  Output 180 ml  Net 2351.88 ml   LBM: Last BM Date: 02/08/21 Baseline Weight: Weight: 81.6 kg Most recent weight: Weight: 81.6 kg       Palliative Assessment/Data: PPS 50%    Flowsheet Rows   Flowsheet Row Most Recent Value  Intake Tab   Referral Department Hospitalist  Unit at Time of Referral Med/Surg Unit  Palliative Care Primary Diagnosis Cancer  Date Notified 02/09/21  Palliative Care Type New Palliative care  Reason for referral Clarify Goals of Care  Date of Admission 02/08/21  Date first seen by Palliative Care 02/09/21  # of days Palliative referral response time 0 Day(s)  # of days IP prior to Palliative referral 1  Clinical Assessment   Palliative Performance Scale Score 50%  Psychosocial & Spiritual Assessment   Palliative Care Outcomes   Patient/Family meeting held? Yes  Who was at the meeting? wife and patient  Patient Active Problem List   Diagnosis Date Noted  . Pneumothorax, right 02/09/2021  . Fall 02/08/2021  . 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 PUD (peptic ulcer disease) 10/11/2020  . Paroxysmal atrial fibrillation ([place]) 02/22/2020  . Cigarette smoker 07/06/2019  . Bilateral carotid artery stenosis 01/05/2019  . Coronary artery disease involving native coronary artery of native heart without angina pectoris 01/05/2019  . Hx of CABG 01/05/2019  . Hyperlipidemia LDL goal <70 01/05/2019  . PVD (peripheral vascular disease) ([place]) 01/05/2019  . Prediabetes 01/05/2019  . History of aortic valve replacement 10/14/2015  . Essential hypertension 10/14/2015    Palliative Care Assessment & Plan   HPI: 75 y.o. male  with past medical history of CAD s/p CABG, HTN, HLD, a fib not on AC, PUD, bovine AVR, arrhythmia  with implantable loop recorder, and recently diagnosed prostate cancer with mets to bones (dx Jan 2022) on oral chemo for 6 weeks present admitted on 02/08/2021 with a fall. Found to have right-sided7th and 8th ribsfractures and right-sided trace apical pneumothorax. Patient reports ongoing dizziness - previously had workup with no significant findings. Also with bilateral lower ext edema - left > right - bilat lower ext [place] neg for DVT. PMT consulted for [place] discussion.   Assessment: I have reviewed medical records including EPIC notes, labs and imaging, received report from RN, assessed the patient and then met with patient and his wife, Vaughan Basta,  to discuss diagnosis prognosis, [place], EOL wishes, disposition and options.  I introduced Palliative Medicine as specialized medical care for people living with serious illness. It focuses on providing relief from the symptoms and stress of a serious illness. The goal is to improve quality of life for both the patient and the family.  Patient shares about his recent diagnosis of metastatic prostate cancer. Shares about oral chemotherapy - feels he is tolerating it well. Does have some dizziness. He also shares about his heart disease.   As far as functional and nutritional status, he tells me about feeling weaker; however, he is able to complete ADLs independently - does need some assistance entering/exiting shower provided by his wife. He shares that he has a poor appetite recently. He tells me about ~25 lb weight loss prior to his cancer diagnosis.    We discussed patient's current illness and what it means in the larger context of patient's on-going co-morbidities.  Natural disease trajectory and expectations at EOL were discussed. Patient shares that with so many chronic illnesses he is not sure which should concern him most. We discuss his decline in function and nutrition and that this is concerning - if we continue to see decline in function, nutrition,  and cognition we worry about poor prognosis.   I attempted to elicit values and goals of care important to the patient. Patient tells me that time with his family and connection to them is most important to him. He tells me of his goal to gain some strength back - is motivated to go to rehab and try to improve strength.     The difference between aggressive medical intervention and comfort care was considered in light of the patient's goals of care. Advance directives, concepts specific to code status, artificial feeding and hydration, and rehospitalization were considered and discussed.   I completed a MOST form today. The patient and family outlined their wishes for the following treatment decisions:  Cardiopulmonary Resuscitation: Do Not Attempt Resuscitation (DNR/No CPR)  Medical Interventions: Limited Additional Interventions:  Use medical treatment, IV fluids and cardiac monitoring as indicated, DO NOT USE intubation or mechanical ventilation. May consider use of less invasive airway support such as BiPAP or CPAP. Also provide comfort measures. Transfer to the hospital if indicated. Avoid intensive care.   Antibiotics: Antibiotics if indicated  IV Fluids: IV fluids if indicated  Feeding Tube: Feeding tube for a defined trial period   Discussed with patient/family the importance of continued conversation with family and the medical providers regarding overall plan of care and treatment options, ensuring decisions are within the context of the patient's values and GOCs.    Reviewed notes and recent scans with wife - she was aware of all information discussed.   Palliative Care services outpatient were explained and offered. Patient and wife were agreeable.   Questions and concerns were addressed. The family was encouraged to call with questions or concerns.   Recommendations/Plan:  MOST completed - DNR, open to other interventions/rehospitalization if needed  Outpatient palliative  follow up - liaison aware   SNF rehab at dc  Code Status:  DNR  Prognosis:   Unable to determine  Discharge Planning:  [place] for rehab with Palliative care service follow-up  Care plan was discussed with patient and wife  Thank you for allowing the Palliative Medicine Team to assist in the care of this patient.   Total Time 50 minutes Prolonged Time Billed  no       Greater than 50%  of this time was spent counseling and coordinating care related to the above assessment and plan.  Juel Burrow, DNP, [facility] Palliative Medicine Team Team Phone # [phone]  Pager [phone]

## 2021-02-11 DIAGNOSIS — W19XXXD Unspecified fall, subsequent encounter: Secondary | ICD-10-CM | POA: Diagnosis not present

## 2021-02-11 DIAGNOSIS — J939 Pneumothorax, unspecified: Secondary | ICD-10-CM | POA: Diagnosis not present

## 2021-02-11 LAB — BASIC METABOLIC PANEL
Anion gap: 8 (ref 5–15)
BUN: 39 mg/dL — ABNORMAL HIGH (ref 8–23)
CO2: 19 mmol/L — ABNORMAL LOW (ref 22–32)
Calcium: 7.9 mg/dL — ABNORMAL LOW (ref 8.9–10.3)
Chloride: 111 mmol/L (ref 98–111)
Creatinine, Ser: 1.38 mg/dL — ABNORMAL HIGH (ref 0.61–1.24)
GFR, Estimated: 53 mL/min — ABNORMAL LOW (ref >60)
Glucose, Bld: 80 mg/dL (ref 70–99)
Potassium: 4.6 mmol/L (ref 3.5–5.1)
Sodium: 138 mmol/L (ref 135–145)

## 2021-02-11 LAB — CBC
HCT: 37.4 % — ABNORMAL LOW (ref 39.0–52.0)
Hemoglobin: 11.5 g/dL — ABNORMAL LOW (ref 13.0–17.0)
MCH: 28.8 pg (ref 26.0–34.0)
MCHC: 30.7 g/dL (ref 30.0–36.0)
MCV: 93.7 fL (ref 80.0–100.0)
Platelets: 159 10*3/uL (ref 150–400)
RBC: 3.99 MIL/uL — ABNORMAL LOW (ref 4.22–5.81)
RDW: 15 % (ref 11.5–15.5)
WBC: 5.6 10*3/uL (ref 4.0–10.5)
nRBC: 0 % (ref 0.0–0.2)

## 2021-02-11 LAB — MAGNESIUM: Magnesium: 1.8 mg/dL (ref 1.7–2.4)

## 2021-02-11 LAB — PHOSPHORUS: Phosphorus: 2.9 mg/dL (ref 2.5–4.6)

## 2021-02-11 MED ORDER — ACETAMINOPHEN 500 MG PO TABS
1000.0000 mg | ORAL_TABLET | Freq: Three times a day (TID) | ORAL | Status: DC
  Administered 2021-02-11 – 2021-02-17 (×18): 1000 mg via ORAL
  Filled 2021-02-11 (×19): qty 2

## 2021-02-11 MED ORDER — HYDRALAZINE HCL 25 MG PO TABS: 25.0000 mg | ORAL_TABLET | Freq: Three times a day (TID) | ORAL | Status: DC | PRN

## 2021-02-11 MED ORDER — AMLODIPINE BESYLATE 5 MG PO TABS
5.0000 mg | ORAL_TABLET | Freq: Every day | ORAL | Status: DC
  Administered 2021-02-11 – 2021-02-15 (×5): 5 mg via ORAL
  Filled 2021-02-11 (×5): qty 1

## 2021-02-11 NOTE — Progress Notes (Addendum)
PROGRESS NOTE    Anthony Figueroa   EMV:[id number]  DOB: [birth year]  PCP: Venita Lick, NP    DOA: 02/08/2021 LOS: 2   Brief Narrative   Anthony Figueroa a 75 y.o.malewith medical history significant forcoronary artery disease status post CABG, essential hypertension, hyperlipidemia, paroxysmal A. fibnot on oralanticoagulation, peptic ulcer disease,recently diagnosedprostate cancer with metastases to the bones (January 2022)on oral chemotherapy since6 weeks ago, history of aortic valve replacement,arrhythmia with implantable loop recorder currently present,who presented to [facility] ED after a fall at home early morning 7 AM when he went to the bathroom and was standing and felt dizzy and fell backwards.    Work-up in the ED revealed right-sided7th and 8th ribsfractures and right-sided trace apical pneumothorax. O2 saturation 94% on room air. BP 131/45, heart rate 55, otherwise normal vitals.  Lab studies remarkable for elevated creatinine 1.56. EDP requested admission for pain control.    Assessment & Plan   Active Problems:   Fall   Pneumothorax, right   Fallsecondary to dizziness -  CT head, lumbar spine, pelvis without contrast negative for any acute findings. However showed osseous metastatic disease. Chronic appearing Schmorl's node/superior endplate fracture at L3.   Denies loss of consciousness, but endorsed feeling dizzy prior to falling - ?orthostatic Has had prior work-up for dizziness previously at outside institution but nothing significant was found. --Follow up on orthostatic vital signs --Fall precautions --PT/OT  Right-sided7th and 8thribs fracture - due to fall above Right-sided apical trace pneumothorax - resolved --O2 if needed to maintain sats >90%. --Pain control: add scheduled Tylenol, continue lidocaine patches, scheduled OxyContin 10 mg BID, PRN oxyIR, reserve PRN IV dilaudid for breakthrough pain --Incentive spirometry 3/18 CXR  Previously noted pneumothorax is no longer confidently identified. --OT evaluation  Bilateral lower extremity edema - Left > Right.  Lower extremity doppler U/S negative for DVT's bilaterally. He has some hypoxia which is more likely due to shallow inspirations with his rib fractures.  Clinically doubt pt has PE at this time, but will monitor closely and consider CTA chest.  AKI on CKD stage 3b - POA with Cr 1.56 with increased up to 1.75, up from apparent baseline ~1.44.  Renal ultrasound was unremarkable --Avoid nephrotoxic agents, hypotension, IV contrast. 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 Tessie Eke at [facility]. Pt's wife stated to RN that they were told to stop chemotherapyXtandi which was d/c;d on 3/18 and continued orgovyx 120 mg daily. --Continue orgovyx  --Palliative care consulted, will follow patient after discharge  Coronary disease status post CABG - stable, no active ischemic chest pain.   --Continue home regimen: aspirin, losartan, rosuvastatin and oral LasixPRN.  GERD - continue PPI    DVT prophylaxis: enoxaparin (LOVENOX) injection 40 mg Start: 02/08/21 2200   Diet:  Diet Orders (From admission, onward)    Start     Ordered   02/08/21 1258  Diet Heart Room service appropriate? Yes; Fluid consistency: Thin  Diet effective now       Question Answer Comment  Room service appropriate? Yes   Fluid consistency: Thin      02/08/21 1257            Code Status: DNR    Subjective 02/11/21    Pt continues to  have pain if he takes a deep breath, not so bad if he takes shallow breaths.  Also pain with coughing.  No fever/chills, phlegm production.  No nausea/vomiting but reports poor appetite and very  little PO intake.   Disposition Plan & Communication   Status is: Inpatient  Inpatient status appropriate as patient has inadequate PO intake, ongoing adjustments to pain control regimen, requires SNF placement  Dispo:  Patient From: Home  Planned Disposition: [place]  Medically stable for discharge: No     Family Communication: None at bedside, will attempt to call this afternoon.    Consults, Procedures, Significant Events     Antimicrobials:  Anti-infectives (From admission, onward)   None        Micro    Objective   Vitals:   02/10/21 1525 02/10/21 2018 02/10/21 2311 02/11/21 0601  BP: (!) 156/57 (!) 172/65 (!) 167/60 (!) 183/68  Pulse: 65 68 67 64  Resp: 16 18 18 18   Temp: 99.2 F (37.3 C) 98.1 F (36.7 C) 98.9 F (37.2 C) 98.3 F (36.8 C)  TempSrc: Oral Oral Oral Oral  SpO2: 95% 95% 93% 97%  Weight:      Height:        Intake/Output Summary (Last 24 hours) at 02/11/2021 1413 Last data filed at 02/11/2021 1356 Gross per 24 hour  Intake 1508.12 ml  Output 925 ml  Net 583.12 ml   Filed Weights   02/08/21 0827  Weight: 81.6 kg    Physical Exam:  General exam: awake, alert, no acute distress, appears fatigued HEENT: moist mucus membranes, hearing grossly normal  Respiratory system: CTAB but shallow inspiration, diminished bases, no wheezes, rales or rhonchi, normal respiratory effort. Cardiovascular system: normal S1/S2, RRR, left>right lower extremity edema Gastrointestinal system: soft, NT, ND, +bowel sounds. Central nervous system: A&O x3. no gross focal neurologic deficits, normal speech Extremities: moves all, no cyanosis, normal tone Psychiatry: normal mood, congruent affect, judgement and insight appear normal  Labs   Data Reviewed: I have personally reviewed following labs and imaging studies  CBC: Recent Labs  Lab 02/08/21 0837 02/09/21 0416 02/10/21 0413 02/11/21 0418  WBC 7.5 7.3 6.5 5.6  NEUTROABS 5.3  --    --   --   HGB 11.8* 12.6* 11.0* 11.5*  HCT 38.1* 39.5 33.9* 37.4*  MCV 93.2 92.1 91.4 93.7  PLT 174 177 146* 277   Basic Metabolic Panel: Recent Labs  Lab 02/08/21 0837 02/09/21 0416 02/10/21 0413 02/11/21 0418  NA 143 141 139 138  K 4.3 4.8 5.0 4.6  CL 111 110 110 111  CO2 26 24 24  19*  GLUCOSE 102* 113* 96 80  BUN 45* 44* 49* 39*  CREATININE 1.56* 1.44* 1.75* 1.38*  CALCIUM 7.8* 8.0* 8.0* 7.9*  MG  --  1.8 1.9 1.8  PHOS  --  4.5 4.3 2.9   GFR: Estimated Creatinine Clearance: 49.3 mL/min (A) (by C-G formula based on SCr of 1.38 mg/dL (H)). Liver Function Tests: Recent Labs  Lab 02/08/21 0837  AST 20  ALT 14  ALKPHOS 911*  BILITOT 0.8  PROT 4.5*  ALBUMIN 2.3*   No results for input(s): LIPASE, AMYLASE in the last 168 hours. No results for input(s): AMMONIA in the last 168 hours. Coagulation Profile: No results for input(s): INR, PROTIME in the last 168 hours. Cardiac Enzymes: No results for input(s): CKTOTAL, CKMB, CKMBINDEX, TROPONINI in the last 168 hours. BNP (last 3 results) No results for input(s): PROBNP in  the last 8760 hours. HbA1C: No results for input(s): HGBA1C in the last 72 hours. CBG: No results for input(s): GLUCAP in the last 168 hours. Lipid Profile: No results for input(s): CHOL, HDL, LDLCALC, TRIG, CHOLHDL, LDLDIRECT in the last 72 hours. Thyroid Function Tests: No results for input(s): TSH, T4TOTAL, FREET4, T3FREE, THYROIDAB in the last 72 hours. Anemia Panel: Recent Labs    02/09/21 0416 02/09/21 0836  VITAMINB12  --  279  FOLATE 9.1  --   TIBC 162*  --   IRON 22*  --    Sepsis Labs: No results for input(s): PROCALCITON, LATICACIDVEN in the last 168 hours.  Recent Results (from the past 240 hour(s))  SARS CORONAVIRUS 2 (TAT 6-24 HRS) Nasopharyngeal Nasopharyngeal Swab     Status: None   Collection Time: 02/08/21 12:35 PM   Specimen: Nasopharyngeal Swab  Result Value Ref Range Status   SARS Coronavirus 2 NEGATIVE NEGATIVE  Final    Comment: (NOTE) SARS-CoV-2 target nucleic acids are NOT DETECTED.  The SARS-CoV-2 RNA is generally detectable in upper and lower respiratory specimens during the acute phase of infection. Negative results do not preclude SARS-CoV-2 infection, do not rule out co-infections with other pathogens, and should not be used as the sole basis for treatment or other patient management decisions. Negative results must be combined with clinical observations, patient history, and epidemiological information. The expected result is Negative.  Fact Sheet for Patients: SugarRoll.be  Fact Sheet for Healthcare Providers: https://www.woods-mathews.com/  This test is not yet approved or cleared by the [place] FDA and  has been authorized for detection and/or diagnosis of SARS-CoV-2 by FDA under an Emergency Use Authorization (EUA). This EUA will remain  in effect (meaning this test can be used) for the duration of the COVID-19 declaration under Se ction 564(b)(1) of the Act, 21 U.S.C. section 360bbb-3(b)(1), unless the authorization is terminated or revoked sooner.  Performed at [facility], [place] [address], [place], [place] [address]       Imaging Studies   US RENAL  Result Date: 2021-02-23 CLINICAL DATA:  Acute renal insufficiency. History of prostate cancer. EXAM: RENAL / URINARY TRACT ULTRASOUND COMPLETE COMPARISON:  CT pelvis 02/08/2021. FINDINGS: Right Kidney: Renal measurements: 9.0 x 4.8 x 4.8 cm = volume: 108 mL. Echogenicity is within normal limits. No mass or hydronephrosis. An 8 mm shadowing echogenic stone is seen. Left Kidney: Renal measurements: 7.5 x 4.6 x 3.6 cm = volume: 65 mL. Echogenicity within normal limits. No mass or hydronephrosis visualized. There may be cortical scarring. Bladder: Contour abnormality of the right bladder is likely transient given normal appearance on 02/08/2021. Ureteral jets are identified.  Prevoid volume of 132 cc. Patient was unable to void. Other: None. IMPRESSION: No acute findings. Electronically Signed   By: Lorin Picket M.D.   On: Feb 23, 2021 12:12   DG Chest Port 1 View  Result Date: 2021-02-23 CLINICAL DATA:  75 year old male with history of right-sided pneumothorax. Follow-up study. EXAM: PORTABLE CHEST 1 VIEW COMPARISON:  Chest x-ray 02/09/2021. FINDINGS: Lung volumes are low. No definite residual pneumothorax confidently identified. Bibasilar opacities (right greater than left), which may reflect areas of atelectasis and/or consolidation. No definite pleural effusions. No evidence of pulmonary edema. Heart size is normal. Upper mediastinal contours are within normal limits. Atherosclerotic calcifications in the thoracic aorta. Status post median sternotomy. Electronic device projecting over the heart, likely an implantable pacemaker device. Innumerable sclerotic lesions noted throughout the visualized skeleton indicative of widespread metastatic disease.  IMPRESSION: 1. Previously noted pneumothorax is no longer confidently identified. 2. Low lung volumes with bibasilar (right greater than left) areas of atelectasis and/or consolidation. 3. Aortic atherosclerosis. 4. Diffuse skeletal metastatic disease. Electronically Signed   By: Vinnie Langton M.D.   On: 02/10/2021 11:29     Medications   Scheduled Meds:  amLODipine  5 mg Oral Daily   vitamin C  500 mg Oral Daily   citalopram  20 mg Oral Daily   cyanocobalamin  1,000 mcg Intramuscular Daily   enoxaparin (LOVENOX) injection  40 mg Subcutaneous Q24H   iron polysaccharides  150 mg Oral Daily   lidocaine  1 patch Transdermal Q24H   oxyCODONE  10 mg Oral BID   pantoprazole  40 mg Oral BID   polyethylene glycol  17 g Oral BID   Relugolix  120 mg Oral Daily   rosuvastatin  5 mg Oral QHS   senna-docusate  1 tablet Oral QHS   traZODone  50 mg Oral QHS   Continuous Infusions:  sodium chloride 75 mL/hr at  02/11/21 0239       LOS: 2 days    Time spent: 30 minutes    Ezekiel Slocumb, DO [facility]  02/11/2021, 2:13 PM      If 7PM-7AM, please contact night-coverage. How to contact the [facility] Attending or Consulting provider [place] or covering provider during after hours [place], for this patient?    1. Check the care team in [facility] and look for a) attending/consulting TRH provider listed and b) the [facility] team listed 2. Log into [email or web address] and use [facility]'s universal password to access. If you do not have the password, please contact the hospital operator. 3. Locate the [facility] provider you are looking for under [facility] and page to a number that you can be directly reached. 4. If you still have difficulty reaching the provider, please page the [facility] (Director on Call) for the Hospitalists listed on amion for assistance.

## 2021-02-12 DIAGNOSIS — J939 Pneumothorax, unspecified: Secondary | ICD-10-CM | POA: Diagnosis not present

## 2021-02-12 DIAGNOSIS — W19XXXD Unspecified fall, subsequent encounter: Secondary | ICD-10-CM | POA: Diagnosis not present

## 2021-02-12 LAB — BASIC METABOLIC PANEL
Anion gap: 6 (ref 5–15)
BUN: 29 mg/dL — ABNORMAL HIGH (ref 8–23)
CO2: 21 mmol/L — ABNORMAL LOW (ref 22–32)
Calcium: 8 mg/dL — ABNORMAL LOW (ref 8.9–10.3)
Chloride: 112 mmol/L — ABNORMAL HIGH (ref 98–111)
Creatinine, Ser: 1 mg/dL (ref 0.61–1.24)
GFR, Estimated: >60 mL/min (ref >60)
Glucose, Bld: 78 mg/dL (ref 70–99)
Potassium: 4.4 mmol/L (ref 3.5–5.1)
Sodium: 139 mmol/L (ref 135–145)

## 2021-02-12 LAB — CBC
HCT: 38.4 % — ABNORMAL LOW (ref 39.0–52.0)
Hemoglobin: 12.2 g/dL — ABNORMAL LOW (ref 13.0–17.0)
MCH: 29 pg (ref 26.0–34.0)
MCHC: 31.8 g/dL (ref 30.0–36.0)
MCV: 91.2 fL (ref 80.0–100.0)
Platelets: 182 10*3/uL (ref 150–400)
RBC: 4.21 MIL/uL — ABNORMAL LOW (ref 4.22–5.81)
RDW: 15.2 % (ref 11.5–15.5)
WBC: 5.6 10*3/uL (ref 4.0–10.5)
nRBC: 0 % (ref 0.0–0.2)

## 2021-02-12 LAB — PHOSPHORUS: Phosphorus: 2.5 mg/dL (ref 2.5–4.6)

## 2021-02-12 LAB — MAGNESIUM: Magnesium: 1.7 mg/dL (ref 1.7–2.4)

## 2021-02-12 MED ORDER — LOSARTAN POTASSIUM 50 MG PO TABS
100.0000 mg | ORAL_TABLET | Freq: Every day | ORAL | Status: DC
  Administered 2021-02-12 – 2021-02-13 (×2): 100 mg via ORAL
  Filled 2021-02-12 (×2): qty 2

## 2021-02-12 NOTE — Progress Notes (Signed)
PROGRESS NOTE    Anthony Figueroa   ZYS:[id number]  DOB: July 23, 1946  PCP: Venita Lick, NP    DOA: 02/08/2021 LOS: 3   Brief Narrative   Anthony Figueroa a 75 y.o.malewith medical history significant forcoronary artery disease status post CABG, essential hypertension, hyperlipidemia, paroxysmal A. fibnot on oralanticoagulation, peptic ulcer disease,recently diagnosedprostate cancer with metastases to the bones (January 2022)on oral chemotherapy since6 weeks ago, history of aortic valve replacement,arrhythmia with implantable loop recorder currently present,who presented to [facility] ED after a fall at home early morning 7 AM when he went to the bathroom and was standing and felt dizzy and fell backwards.    Work-up in the ED revealed right-sided7th and 8th ribsfractures and right-sided trace apical pneumothorax. O2 saturation 94% on room air. BP 131/45, heart rate 55, otherwise normal vitals.  Lab studies remarkable for elevated creatinine 1.56. EDP requested admission for pain control.    Assessment & Plan   Active Problems:   Fall   Pneumothorax, right   Fallsecondary to dizziness -  CT head, lumbar spine, pelvis without contrast negative for any acute findings. However showed osseous metastatic disease. Chronic appearing Schmorl's node/superior endplate fracture at L3.   Denies loss of consciousness, but endorsed feeling dizzy prior to falling - ?orthostatic Has had prior work-up for dizziness previously at outside institution but nothing significant was found. --Follow up on orthostatic vital signs --Fall precautions --PT/OT -SNF recommended.  TOC following  Right-sided7th and 8thribs fracture - due to fall above Right-sided apical trace pneumothorax - resolved 3/18 CXR Previously noted pneumothorax is no longer confidently identified. --Repeat chest x-ray if patient develops worsening shortness of breath or chest pain --O2 if needed to maintain sats  >90%. --Pain control: scheduled Tylenol, lidocaine patches, scheduled OxyContin 10 mg BID, PRN oxyIR, reserve PRN IV dilaudid for breakthrough pain --Bowel regimen --Incentive spirometry --OT evaluation  Bilateral lower extremity edema - Left > Right.   Chronic left lower extremity swelling secondary to vein graft for CABG.  Lower extremity doppler U/S negative for DVT's bilaterally.  He has some hypoxia which is more likely due to shallow inspirations due to pain from rib fractures.  Clinically doubt pt has PE at this time, but will monitor closely and consider CTA chest.  AKI on CKD stage 3b - POA with Cr 1.56 with increased up to 1.75, up from apparent baseline ~1.44.  AKI likely prerenal due to poor p.o. intake. AKI has resolved with IV hydration-creatinine 1.00 on 3/20 Renal ultrasound was unremarkable. --Monitor BMP daily --Monitor I/O's and for signs of urinary retention --Resume losartan --Continue holding Lasixprn for now  Prostate cancer metastasis to the bones - Follows with hematology oncology, Dr. Tessie Eke at [facility]. Pt's wife stated to RN that they were told to stop chemotherapyXtandi which was d/c;d on 3/18 and continued orgovyx 120 mg daily. --Continue orgovyx  --Palliative care consulted, will follow patient after discharge  Coronary disease status post CABG - stable, no active ischemic chest pain.   --Continue home regimen: aspirin, losartan, rosuvastatin and oral LasixPRN.  GERD - continue PPI    DVT prophylaxis: enoxaparin (LOVENOX) injection 40 mg Start: 02/08/21 2200   Diet:  Diet Orders (From admission, onward)    Start     Ordered   02/08/21 1258  Diet Heart Room service appropriate? Yes; Fluid consistency: Thin  Diet effective now       Question Answer Comment  Room service appropriate? Yes   Fluid consistency: Thin  02/08/21 1257            Code Status: DNR    Subjective 02/12/21    Pt reports improved pain control today.  Says  he is able to take somewhat deeper breaths.  Wife is at bedside.  Patient denies cough or congestion, fevers or chills, nausea vomiting or other acute complaints.   Disposition Plan & Communication   Status is: Inpatient  Inpatient status appropriate as patient has inadequate PO intake, ongoing adjustments to pain control regimen, requires SNF placement  Dispo:  Patient From: Home  Planned Disposition: [place]  Medically stable for discharge: No     Family Communication: wife at bedside on rounds today   Consults, Procedures, Significant Events     Antimicrobials:  Anti-infectives (From admission, onward)   None        Micro    Objective   Vitals:   02/11/21 1933 02/12/21 0528 02/12/21 0745 02/12/21 1112  BP: (!) 162/64 (!) 176/77 (!) 168/66 140/65  Pulse: 69 72 64 64  Resp: 16 16 18 16   Temp: 98 F (36.7 C) 97.9 F (36.6 C) (!) 97.5 F (36.4 C) 97.8 F (36.6 C)  TempSrc: Oral Oral Oral Oral  SpO2: 96% 97% 98% 99%  Weight:      Height:        Intake/Output Summary (Last 24 hours) at 02/12/2021 1606 Last data filed at 02/12/2021 1353 Gross per 24 hour  Intake 120 ml  Output 500 ml  Net -380 ml   Filed Weights   02/08/21 0827  Weight: 81.6 kg    Physical Exam:  General exam: awake, alert, no acute distress Respiratory system: CTAB with improved inspiratory ability today, diminished bases, no wheezes, normal respiratory effort. Cardiovascular system: normal S1/S2, RRR, left>right lower extremity edema stable Central nervous system: A&O x3. no gross focal neurologic deficits, normal speech Psychiatry: normal mood, congruent affect, judgement and insight appear normal  Labs   Data Reviewed: I have personally reviewed following labs and imaging studies  CBC: Recent Labs  Lab 02/08/21 0837 02/09/21 0416 02/10/21 0413 02/11/21 0418 02/12/21 0430  WBC 7.5 7.3 6.5 5.6 5.6  NEUTROABS 5.3  --   --   --   --   HGB 11.8* 12.6* 11.0*  11.5* 12.2*  HCT 38.1* 39.5 33.9* 37.4* 38.4*  MCV 93.2 92.1 91.4 93.7 91.2  PLT 174 177 146* 159 283   Basic Metabolic Panel: Recent Labs  Lab 02/08/21 0837 02/09/21 0416 02/10/21 0413 02/11/21 0418 02/12/21 0430  NA 143 141 139 138 139  K 4.3 4.8 5.0 4.6 4.4  CL 111 110 110 111 112*  CO2 26 24 24  19* 21*  GLUCOSE 102* 113* 96 80 78  BUN 45* 44* 49* 39* 29*  CREATININE 1.56* 1.44* 1.75* 1.38* 1.00  CALCIUM 7.8* 8.0* 8.0* 7.9* 8.0*  MG  --  1.8 1.9 1.8 1.7  PHOS  --  4.5 4.3 2.9 2.5   GFR: Estimated Creatinine Clearance: 68 mL/min (by C-G formula based on SCr of 1 mg/dL). Liver Function Tests: Recent Labs  Lab 02/08/21 0837  AST 20  ALT 14  ALKPHOS 911*  BILITOT 0.8  PROT 4.5*  ALBUMIN 2.3*   No results for input(s): LIPASE, AMYLASE in the last 168 hours. No results for input(s): AMMONIA in the last 168 hours. Coagulation Profile: No results for input(s): INR, PROTIME in the last 168 hours. Cardiac Enzymes: No results for input(s): CKTOTAL, CKMB, CKMBINDEX, TROPONINI in  the last 168 hours. BNP (last 3 results) No results for input(s): PROBNP in the last 8760 hours. HbA1C: No results for input(s): HGBA1C in the last 72 hours. CBG: No results for input(s): GLUCAP in the last 168 hours. Lipid Profile: No results for input(s): CHOL, HDL, LDLCALC, TRIG, CHOLHDL, LDLDIRECT in the last 72 hours. Thyroid Function Tests: No results for input(s): TSH, T4TOTAL, FREET4, T3FREE, THYROIDAB in the last 72 hours. Anemia Panel: No results for input(s): VITAMINB12, FOLATE, FERRITIN, TIBC, IRON, RETICCTPCT in the last 72 hours. Sepsis Labs: No results for input(s): PROCALCITON, LATICACIDVEN in the last 168 hours.  Recent Results (from the past 240 hour(s))  SARS CORONAVIRUS 2 (TAT 6-24 HRS) Nasopharyngeal Nasopharyngeal Swab     Status: None   Collection Time: 02/08/21 12:35 PM   Specimen: Nasopharyngeal Swab  Result Value Ref Range Status   SARS Coronavirus 2 NEGATIVE  NEGATIVE Final    Comment: (NOTE) SARS-CoV-2 target nucleic acids are NOT DETECTED.  The SARS-CoV-2 RNA is generally detectable in upper and lower respiratory specimens during the acute phase of infection. Negative results do not preclude SARS-CoV-2 infection, do not rule out co-infections with other pathogens, and should not be used as the sole basis for treatment or other patient management decisions. Negative results must be combined with clinical observations, patient history, and epidemiological information. The expected result is Negative.  Fact Sheet for Patients: SugarRoll.be  Fact Sheet for Healthcare Providers: https://www.woods-mathews.com/  This test is not yet approved or cleared by the [place] FDA and  has been authorized for detection and/or diagnosis of SARS-CoV-2 by FDA under an Emergency Use Authorization (EUA). This EUA will remain  in effect (meaning this test can be used) for the duration of the COVID-19 declaration under Se ction 564(b)(1) of the Act, 21 U.S.C. section 360bbb-3(b)(1), unless the authorization is terminated or revoked sooner.  Performed at [facility], [place] [address] [place], [place] [address]       Imaging Studies   No results found.   Medications   Scheduled Meds: . acetaminophen  1,000 mg Oral Q8H  . amLODipine  5 mg Oral Daily  . vitamin C  500 mg Oral Daily  . citalopram  20 mg Oral Daily  . cyanocobalamin  1,000 mcg Intramuscular Daily  . enoxaparin (LOVENOX) injection  40 mg Subcutaneous Q24H  . iron polysaccharides  150 mg Oral Daily  . lidocaine  1 patch Transdermal Q24H  . losartan  100 mg Oral Daily  . oxyCODONE  10 mg Oral BID  . pantoprazole  40 mg Oral BID  . polyethylene glycol  17 g Oral BID  . Relugolix  120 mg Oral Daily  . rosuvastatin  5 mg Oral QHS  . senna-docusate  1 tablet Oral QHS  . traZODone  50 mg Oral QHS   Continuous Infusions: . sodium  chloride 75 mL/hr at 02/11/21 0239       LOS: 3 days    Time spent: 25 minutes with >50% spent at bedside and in coordination of care    Ezekiel Slocumb, DO [facility]  02/12/2021, 4:06 PM      If 7PM-7AM, please contact night-coverage. How to contact the [facility] Attending or Consulting provider [place] or covering provider during after hours [place], for this patient?    1. Check the care team in [facility] and look for a) attending/consulting TRH provider listed and b) the [facility] team listed 2. Log into [email or web address] and  use 's universal password to access. If you do not have the password, please contact the hospital operator. 3. Locate the [facility] provider you are looking for under [facility] and page to a number that you can be directly reached. 4. If you still have difficulty reaching the provider, please page the [facility] (Director on Call) for the Hospitalists listed on amion for assistance.

## 2021-02-12 NOTE — TOC Progression Note (Signed)
Transition of Care [facility]) - Progression Note    Patient Details  Name: Anthony Figueroa MRN: [id number] Date of Birth: 07/01/1946  Transition of Care [facility]) CM/SW Contact  Harriet Masson, RN Phone Number: [phone] 02/12/2021, 2:51 PM  Clinical Narrative:    RN spoke with pt and explained the available bed offers only at [facility] at this time. Pt still requesting [facility] however no bed offers at this time. Explained the progress for placement and offered to call [name]) to discuss in detail however pt declined contacting his wife and wishes to discuss the bed offer with her directly. No other needs presented at this time.  TOC team will continue to follow.   Expected Discharge Plan: [place] Barriers to Discharge: Continued Medical Work up  Expected Discharge Plan and Services Expected Discharge Plan: [place] In-house Referral: NA   Post Acute Care Choice: [place] Living arrangements for the past 2 months: Single Family Home                         Representative spoke with at DME Agency: n/a         Representative spoke with at [place]: n/a   Social Determinants of Health ([place]) Interventions    Readmission Risk Interventions No flowsheet data found.

## 2021-02-13 DIAGNOSIS — R42 Dizziness and giddiness: Secondary | ICD-10-CM

## 2021-02-13 DIAGNOSIS — J939 Pneumothorax, unspecified: Secondary | ICD-10-CM | POA: Diagnosis not present

## 2021-02-13 DIAGNOSIS — W19XXXD Unspecified fall, subsequent encounter: Secondary | ICD-10-CM | POA: Diagnosis not present

## 2021-02-13 LAB — BASIC METABOLIC PANEL
Anion gap: 3 — ABNORMAL LOW (ref 5–15)
BUN: 27 mg/dL — ABNORMAL HIGH (ref 8–23)
CO2: 22 mmol/L (ref 22–32)
Calcium: 7.9 mg/dL — ABNORMAL LOW (ref 8.9–10.3)
Chloride: 114 mmol/L — ABNORMAL HIGH (ref 98–111)
Creatinine, Ser: 0.97 mg/dL (ref 0.61–1.24)
GFR, Estimated: >60 mL/min (ref >60)
Glucose, Bld: 86 mg/dL (ref 70–99)
Potassium: 4.3 mmol/L (ref 3.5–5.1)
Sodium: 139 mmol/L (ref 135–145)

## 2021-02-13 LAB — CBC
HCT: 38.7 % — ABNORMAL LOW (ref 39.0–52.0)
Hemoglobin: 12.2 g/dL — ABNORMAL LOW (ref 13.0–17.0)
MCH: 29.3 pg (ref 26.0–34.0)
MCHC: 31.5 g/dL (ref 30.0–36.0)
MCV: 92.8 fL (ref 80.0–100.0)
Platelets: 172 10*3/uL (ref 150–400)
RBC: 4.17 MIL/uL — ABNORMAL LOW (ref 4.22–5.81)
RDW: 15.4 % (ref 11.5–15.5)
WBC: 5.5 10*3/uL (ref 4.0–10.5)
nRBC: 0 % (ref 0.0–0.2)

## 2021-02-13 LAB — MAGNESIUM: Magnesium: 1.8 mg/dL (ref 1.7–2.4)

## 2021-02-13 MED ORDER — LOSARTAN POTASSIUM 50 MG PO TABS: 50.0000 mg | ORAL_TABLET | Freq: Every day | ORAL | Status: DC

## 2021-02-13 NOTE — Unmapped (Signed)
Hi,     Patient Douglas Gray's spouse contacted the [facility] today stating that her husband is still awaiting his medication refill on the medicine orgovyx 120 mg. Please refill this for the patient as she states he is running out of this medication. Contact patient back at [phone].     .Thank you,   Noland Fordyce  [facility]  [phone]

## 2021-02-13 NOTE — Unmapped (Signed)
I have reviewed the battery and overall device/lead status, diagnostic data including arrhythmic events on the remote device evaluation on [employer].    Electronically signed by    Bettye Boeck, MD, [employer]  Electrophysiology - Cardiovascular Diseases  [facility])  [place], [place]   [phone] (phone)  [phone] (fax)    Electronically signed 02/13/2021, 7:41 AM

## 2021-02-13 NOTE — Unmapped (Signed)
LINQ REMOTE MONITORING ASSESSMENT    Date of Transmission:  January 30, 2021      Manufacturer of Device:    [employer]    Type of Device:    Implantable Loop Monitor    See scanned/downloaded PDF report for model numbers, serial numbers, and date(s) of implant.    Presenting Rhythm:       Sr @ 48bpm    Heart Rate Variability:   Not Applicable    Patient Activity:    Patient Active n/a      Device Findings:    Please see downloaded PDF file of transmission under Media Tab  for full details of device interrogation to include, when applicable, battery status/charge time, lead trend data, and programmed parameters.    Battery status OK/stable  Pause detected episodes; undersensing    Plan:    Continue Routine Remote Monitoring     Darlin Drop  [employer]  02/12/2021  8:46 PM

## 2021-02-13 NOTE — Progress Notes (Signed)
Stated that he "saw a pain pill" on his tray but he immediately said "I've been seeing things."

## 2021-02-13 NOTE — Progress Notes (Signed)
Physical Therapy Treatment Patient Details Name: Anthony Figueroa MRN: [id number] DOB: Apr 03, 1946 Today's Date: 02/13/2021    History of Present Illness Anthony Figueroa is a 75 y.o. male with medical history significant for coronary artery disease status post CABG, essential hypertension, hyperlipidemia, paroxysmal A. 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 Discussed with MD who cleared pt for participation with stable pneumothorax.    PT Comments    Pt alert, oriented to self, place, may have had some situational confusion during session (thought he had to get up on the dynamap and then later on the counter where the bedpan was in order to go to the bathroom), but able to follow all commands, behavior WFLs. Pt performed rolling minA to initiate log roll technique to minimize pain and maximize independence, ultimately still maxA to come up into sitting. Fair sitting balance noted, bilateral UE support. Pt with significant complaints of dizziness, and +orthostatics (RN and MD notified) BP in sitting 86/56. Once returned to supine maxAx2, BP 122/71 (72). Pt educated on supine LE exercises and performed with verbal cues. The patient would benefit from further skilled PT intervention to continue to progress towards goals. Recommendation remains appropriate.     Follow Up Recommendations  SNF     Equipment Recommendations  Hospital bed;Wheelchair (measurements PT) (hoyer lift)    Recommendations for Other Services       Precautions / Restrictions Precautions Precautions: Fall Precaution Comments: R rib fx and  pneumothorax Restrictions Weight Bearing Restrictions: No    Mobility  Bed Mobility Overal bed mobility: Needs Assistance Bed Mobility: Rolling;Sidelying to Sit;Supine to Sit Rolling: Min assist Sidelying to sit: Max assist;HOB elevated   Sit to supine: +2 for physical assistance;Max assist   General bed mobility comments: educted on log rolling technique to maximize independence, as well as minimize rib pain. maxAx2 to return to bed due to fatigue and pt dizziness    Transfers                 General transfer comment: unable at this time due to low BP  Ambulation/Gait             General Gait Details: unable to at this time due to low BP   Stairs             Wheelchair Mobility    Modified Rankin (Stroke Patients Only)       Balance Overall balance assessment: Needs assistance Sitting-balance support: Bilateral upper extremity supported;Feet supported Sitting balance-Leahy Scale: Fair Sitting balance - Comments: forward flexed posture                                    Cognition Arousal/Alertness: Awake/alert Behavior During Therapy: WFL for tasks assessed/performed Overall Cognitive Status: Within Functional Limits for tasks assessed                                 General Comments: pt may have had some situtional awareness deficits during session but oriented and followed  commands      Exercises Other Exercises Other Exercises: Orthostatic vitals assessed: supine 105/64 (79), sitting 86/56 (86) Other Exercises: Pt instructed in ankle pumps, quad sets, heel slides, glute sets. pt able to perform x10 bilaterally with verbal cueing, much more difficulty with R knee flexion compared to L    General Comments        Pertinent Vitals/Pain Pain Assessment: Faces Faces Pain Scale: Hurts whole lot Pain Location: back Pain Descriptors / Indicators: Discomfort;Dull;Grimacing;Moaning;Guarding Pain Intervention(s):  Limited activity within patient's tolerance;Premedicated before session;Repositioned    Home Living                      Prior Function            PT Goals (current goals can now be found in the care plan section) Progress towards PT goals: Progressing toward goals    Frequency    Min 2X/week      PT Plan Current plan remains appropriate    Co-evaluation              AM-PAC PT "6 Clicks" Mobility   Outcome Measure  Help needed turning from your back to your side while in a flat bed without using bedrails?: A Lot Help needed moving from lying on your back to sitting on the side of a flat bed without using bedrails?: A Lot Help needed moving to and from a bed to a chair (including a wheelchair)?: Total Help needed standing up from a chair using your arms (e.g., wheelchair or bedside chair)?: Total Help needed to walk in hospital room?: Total Help needed climbing 3-5 steps with a railing? : Total 6 Click Score: 8    End of Session Equipment Utilized During Treatment: Oxygen Activity Tolerance: Treatment limited secondary to medical complications (Comment);Other (comment) (limited by BP) Patient left: in bed;with bed alarm set;with call bell/phone within reach Nurse Communication: Mobility status PT Visit Diagnosis: Unsteadiness on feet (R26.81);Muscle weakness (generalized) (M62.81);History of falling (Z91.81);Difficulty in walking, not elsewhere classified (R26.2);Pain;Dizziness and giddiness (R42) Pain - Right/Left: Right (ribs and back)     Time: 5997-7414 PT Time Calculation (min) (ACUTE ONLY): 28 min  Charges:  $Therapeutic Exercise: 8-22 mins $Therapeutic Activity: 8-22 mins                     Lieutenant Diego PT, DPT 11:32 AM,02/13/21

## 2021-02-13 NOTE — Progress Notes (Signed)
PROGRESS NOTE    Anthony Figueroa   ZJI:[id number]  DOB: Sep 08, 1946  PCP: Venita Lick, NP    DOA: 02/08/2021 LOS: 4   Brief Narrative   Anthony Figueroa a 75 y.o.malewith medical history significant forcoronary artery disease status post CABG, essential hypertension, hyperlipidemia, paroxysmal A. fibnot on oralanticoagulation, peptic ulcer disease,recently diagnosedprostate cancer with metastases to the bones (January 2022)on oral chemotherapy since6 weeks ago, history of aortic valve replacement,arrhythmia with implantable loop recorder currently present,who presented to [facility] ED after a fall at home early morning 7 AM when he went to the bathroom and was standing and felt dizzy and fell backwards.    Work-up in the ED revealed right-sided7th and 8th ribsfractures and right-sided trace apical pneumothorax. O2 saturation 94% on room air. BP 131/45, heart rate 55, otherwise normal vitals.  Lab studies remarkable for elevated creatinine 1.56. EDP requested admission for pain control.    Assessment & Plan   Active Problems:   Fall   Pneumothorax, right   Fallsecondary to dizziness -  CT head, lumbar spine, pelvis without contrast negative for any acute findings. However showed osseous metastatic disease. Chronic appearing Schmorl's node/superior endplate fracture at L3.   Denies loss of consciousness, but endorsed feeling dizzy prior to falling - orthostatic VIitals + - RAS inhibitors (Losartan) can contribute to orthostasis. Will stop losartan for now. If it continues, could consider stopping SSRI as that's another potential med or try fludrocortisone 0.05 mg once daily to see if it helps. Has had prior work-up for dizziness previously at outside institution but nothing significant was found. --Follow up on orthostatic vital signs --Fall precautions --PT/OT -SNF recommended.  TOC following  Right-sided7th and 8thribs fracture - due to fall above Right-sided  apical trace pneumothorax - resolved 3/18 CXR Previously noted pneumothorax is no longer confidently identified. --Repeat chest x-ray if patient develops worsening shortness of breath or chest pain --O2 if needed to maintain sats >90%. --Pain control: scheduled Tylenol, lidocaine patches, scheduled OxyContin 10 mg BID, PRN oxyIR, reserve PRN IV dilaudid for breakthrough pain --Bowel regimen --Incentive spirometry --OT evaluation  Bilateral lower extremity edema - Left > Right.   Chronic left lower extremity swelling secondary to vein graft for CABG.  Lower extremity doppler U/S negative for DVT's bilaterally.  He has some hypoxia which is more likely due to shallow inspirations due to pain from rib fractures.  Clinically doubt pt has PE at this time, but will monitor closely and consider CTA chest.  AKI on CKD stage 3b - POA with Cr 1.56 with increased up to 1.75, up from apparent baseline ~1.44.  AKI likely prerenal due to poor p.o. intake. AKI has resolved with IV hydration-creatinine 1.00 on 3/20 Renal ultrasound was unremarkable. --Monitor BMP daily --Monitor I/O's and for signs of urinary retention --Hold losartan -> could be contributor for orthostasis --Continue holding Lasixprn for now  Prostate cancer metastasis to the bones - Follows with hematology oncology, Dr. Tessie Eke at [facility]. Pt's wife stated to RN that they were told to stop chemotherapyXtandi which was d/c;d on 3/18 and continued orgovyx 120 mg daily. --Continue orgovyx  --Palliative care consulted, will follow patient after discharge  Coronary disease status post CABG - stable, no active ischemic chest pain.   --Continue home regimen: aspirin, losartan, rosuvastatin and oral LasixPRN.  GERD - continue PPI    DVT prophylaxis: enoxaparin (LOVENOX) injection 40 mg Start: 02/08/21 2200   Diet:  Diet Orders (From admission, onward)    Start  Ordered   02/08/21 1258  Diet Heart Room service appropriate?  Yes; Fluid consistency: Thin  Diet effective now       Question Answer Comment  Room service appropriate? Yes   Fluid consistency: Thin      02/08/21 1257            Code Status: DNR    Subjective 02/13/21    Got very dizzi working with PT today with + orthostasis. Wants TOC to work his wife for SNF  Disposition Plan & Communication   Status is: Inpatient  Inpatient status appropriate as patient has inadequate PO intake, ongoing adjustments to pain control regimen, requires SNF placement  Dispo:  Patient From: Home  Planned Disposition: [place]  Medically stable for discharge: No     Family Communication: none    Consults, Procedures, Significant Events     Antimicrobials:  Anti-infectives (From admission, onward)   None        Micro    Objective   Vitals:   02/12/21 2304 02/13/21 0409 02/13/21 0800 02/13/21 1130  BP: 116/67 (!) 148/70 (!) 145/74 112/65  Pulse: 64 70 67 74  Resp: 18 17 18 16   Temp: 98 F (36.7 C) 98.5 F (36.9 C) 98.7 F (37.1 C) (!) 97.4 F (36.3 C)  TempSrc: Oral  Oral Oral  SpO2: 99% 99% 98% 97%  Weight:      Height:        Intake/Output Summary (Last 24 hours) at 02/13/2021 2032 Last data filed at 02/13/2021 1500 Gross per 24 hour  Intake 0 ml  Output 550 ml  Net -550 ml   Filed Weights   02/08/21 0827  Weight: 81.6 kg    Physical Exam:  General exam: awake, alert, no acute distress Respiratory system: CTAB with improved inspiratory ability today, diminished bases, no wheezes, normal respiratory effort. Cardiovascular system: normal S1/S2, RRR, left>right lower extremity edema stable Central nervous system: A&O x3. no gross focal neurologic deficits, normal speech Psychiatry: normal mood, congruent affect, judgement and insight appear normal  Labs   Data Reviewed: I have personally reviewed following labs and imaging studies  CBC: Recent Labs  Lab 02/08/21 0837 02/09/21 0416 02/10/21 0413  02/11/21 0418 02/12/21 0430 02/13/21 0508  WBC 7.5 7.3 6.5 5.6 5.6 5.5  NEUTROABS 5.3  --   --   --   --   --   HGB 11.8* 12.6* 11.0* 11.5* 12.2* 12.2*  HCT 38.1* 39.5 33.9* 37.4* 38.4* 38.7*  MCV 93.2 92.1 91.4 93.7 91.2 92.8  PLT 174 177 146* 159 182 099   Basic Metabolic Panel: Recent Labs  Lab 02/09/21 0416 02/10/21 0413 02/11/21 0418 02/12/21 0430 02/13/21 0508  NA 141 139 138 139 139  K 4.8 5.0 4.6 4.4 4.3  CL 110 110 111 112* 114*  CO2 24 24 19* 21* 22  GLUCOSE 113* 96 80 78 86  BUN 44* 49* 39* 29* 27*  CREATININE 1.44* 1.75* 1.38* 1.00 0.97  CALCIUM 8.0* 8.0* 7.9* 8.0* 7.9*  MG 1.8 1.9 1.8 1.7 1.8  PHOS 4.5 4.3 2.9 2.5  --    GFR: Estimated Creatinine Clearance: 70.1 mL/min (by C-G formula based on SCr of 0.97 mg/dL). Liver Function Tests: Recent Labs  Lab 02/08/21 0837  AST 20  ALT 14  ALKPHOS 911*  BILITOT 0.8  PROT 4.5*  ALBUMIN 2.3*   No results for input(s): LIPASE, AMYLASE in the last 168 hours. No results for input(s): AMMONIA in the  last 168 hours. Coagulation Profile: No results for input(s): INR, PROTIME in the last 168 hours. Cardiac Enzymes: No results for input(s): CKTOTAL, CKMB, CKMBINDEX, TROPONINI in the last 168 hours. BNP (last 3 results) No results for input(s): PROBNP in the last 8760 hours. HbA1C: No results for input(s): HGBA1C in the last 72 hours. CBG: No results for input(s): GLUCAP in the last 168 hours. Lipid Profile: No results for input(s): CHOL, HDL, LDLCALC, TRIG, CHOLHDL, LDLDIRECT in the last 72 hours. Thyroid Function Tests: No results for input(s): TSH, T4TOTAL, FREET4, T3FREE, THYROIDAB in the last 72 hours. Anemia Panel: No results for input(s): VITAMINB12, FOLATE, FERRITIN, TIBC, IRON, RETICCTPCT in the last 72 hours. Sepsis Labs: No results for input(s): PROCALCITON, LATICACIDVEN in the last 168 hours.  Recent Results (from the past 240 hour(s))  SARS CORONAVIRUS 2 (TAT 6-24 HRS) Nasopharyngeal  Nasopharyngeal Swab     Status: None   Collection Time: 02/08/21 12:35 PM   Specimen: Nasopharyngeal Swab  Result Value Ref Range Status   SARS Coronavirus 2 NEGATIVE NEGATIVE Final    Comment: (NOTE) SARS-CoV-2 target nucleic acids are NOT DETECTED.  The SARS-CoV-2 RNA is generally detectable in upper and lower respiratory specimens during the acute phase of infection. Negative results do not preclude SARS-CoV-2 infection, do not rule out co-infections with other pathogens, and should not be used as the sole basis for treatment or other patient management decisions. Negative results must be combined with clinical observations, patient history, and epidemiological information. The expected result is Negative.  Fact Sheet for Patients: SugarRoll.be  Fact Sheet for Healthcare Providers: https://www.woods-mathews.com/  This test is not yet approved or cleared by the [place] FDA and  has been authorized for detection and/or diagnosis of SARS-CoV-2 by FDA under an Emergency Use Authorization (EUA). This EUA will remain  in effect (meaning this test can be used) for the duration of the COVID-19 declaration under Se ction 564(b)(1) of the Act, 21 U.S.C. section 360bbb-3(b)(1), unless the authorization is terminated or revoked sooner.  Performed at [facility], [place] [address] [place], [place] [address]       Imaging Studies   No results found.   Medications   Scheduled Meds: . acetaminophen  1,000 mg Oral Q8H  . amLODipine  5 mg Oral Daily  . vitamin C  500 mg Oral Daily  . citalopram  20 mg Oral Daily  . cyanocobalamin  1,000 mcg Intramuscular Daily  . enoxaparin (LOVENOX) injection  40 mg Subcutaneous Q24H  . iron polysaccharides  150 mg Oral Daily  . lidocaine  1 patch Transdermal Q24H  . [START ON 02/14/2021] losartan  50 mg Oral Daily  . oxyCODONE  10 mg Oral BID  . pantoprazole  40 mg Oral BID  . polyethylene  glycol  17 g Oral BID  . Relugolix  120 mg Oral Daily  . rosuvastatin  5 mg Oral QHS  . senna-docusate  1 tablet Oral QHS  . traZODone  50 mg Oral QHS   Continuous Infusions:      LOS: 4 days    Time spent: 25 minutes with >50% spent at bedside and in coordination of care    Max Sane, MD [facility]  02/13/2021, 8:32 PM      If 7PM-7AM, please contact night-coverage. How to contact the [facility] Attending or Consulting provider [place] or covering provider during after hours [place], for this patient?    1. Check the care team in  [facility] and look for a) attending/consulting [facility] provider listed and b) the [facility] team listed 2. Log into [email or web address] and use [place]'s universal password to access. If you do not have the password, please contact the hospital operator. 3. Locate the [facility] provider you are looking for under [facility] and page to a number that you can be directly reached. 4. If you still have difficulty reaching the provider, please page the [facility] (Director on Call) for the Hospitalists listed on amion for assistance.

## 2021-02-13 NOTE — TOC Progression Note (Signed)
Transition of Care [facility]) - Progression Note    Patient Details  Name: Anthony Figueroa MRN: [id number] Date of Birth: 02-Nov-1946  Transition of Care [facility]) CM/SW Contact  Beverly Sessions, RN Phone Number: 02/13/2021, 4:41 PM  Clinical Narrative:     Bed offer presented to patient and wife.  They would like to know if [facility] is able to offer a bed prior to accepting bed at [facility].   I have reached out to Tammy at [facility] and they are to review.  Wife in agreement to extend bed search.  Bed search extended   Expected Discharge Plan: [place] Barriers to Discharge: Continued Medical Work up  Expected Discharge Plan and Services Expected Discharge Plan: [place] In-house Referral: NA   Post Acute Care Choice: [place] Living arrangements for the past 2 months: Single Family Home                         Representative spoke with at DME Agency: n/a         Representative spoke with at [place]: n/a   Social Determinants of Health ([place]) Interventions    Readmission Risk Interventions No flowsheet data found.

## 2021-02-13 NOTE — Care Management Important Message (Signed)
Important Message  Patient Details  Name: Anthony Figueroa MRN: [id number] Date of Birth: 1946/06/19   Medicare Important Message Given:  Yes     Dannette Barbara 02/13/2021, 12:00 PM

## 2021-02-14 DIAGNOSIS — W19XXXD Unspecified fall, subsequent encounter: Secondary | ICD-10-CM | POA: Diagnosis not present

## 2021-02-14 DIAGNOSIS — C61 Malignant neoplasm of prostate: Secondary | ICD-10-CM | POA: Diagnosis not present

## 2021-02-14 DIAGNOSIS — C7951 Secondary malignant neoplasm of bone: Secondary | ICD-10-CM | POA: Diagnosis not present

## 2021-02-14 DIAGNOSIS — R42 Dizziness and giddiness: Secondary | ICD-10-CM | POA: Diagnosis not present

## 2021-02-14 LAB — BASIC METABOLIC PANEL
Anion gap: 4 — ABNORMAL LOW (ref 5–15)
BUN: 25 mg/dL — ABNORMAL HIGH (ref 8–23)
CO2: 25 mmol/L (ref 22–32)
Calcium: 8.1 mg/dL — ABNORMAL LOW (ref 8.9–10.3)
Chloride: 110 mmol/L (ref 98–111)
Creatinine, Ser: 0.99 mg/dL (ref 0.61–1.24)
GFR, Estimated: >60 mL/min (ref >60)
Glucose, Bld: 77 mg/dL (ref 70–99)
Potassium: 4 mmol/L (ref 3.5–5.1)
Sodium: 139 mmol/L (ref 135–145)

## 2021-02-14 LAB — CBC
HCT: 42.4 % (ref 39.0–52.0)
Hemoglobin: 13.3 g/dL (ref 13.0–17.0)
MCH: 28.8 pg (ref 26.0–34.0)
MCHC: 31.4 g/dL (ref 30.0–36.0)
MCV: 91.8 fL (ref 80.0–100.0)
Platelets: 190 10*3/uL (ref 150–400)
RBC: 4.62 MIL/uL (ref 4.22–5.81)
RDW: 15.7 % — ABNORMAL HIGH (ref 11.5–15.5)
WBC: 5.9 10*3/uL (ref 4.0–10.5)
nRBC: 0 % (ref 0.0–0.2)

## 2021-02-14 MED ORDER — FLUDROCORTISONE ACETATE 0.1 MG PO TABS
0.0500 mg | ORAL_TABLET | Freq: Every day | ORAL | Status: DC
  Administered 2021-02-14 – 2021-02-15 (×2): 0.05 mg via ORAL
  Filled 2021-02-14 (×2): qty 0.5

## 2021-02-14 NOTE — Unmapped (Signed)
Called [name] to let her know I spoke with [facility] and they are processing his script. Should be in touch with her to arrange delivery today or tomorrow.Let them know pt only has a couple of days of drug left.She will call back with any further needs.

## 2021-02-14 NOTE — Unmapped (Signed)
Attempted to return call. Left VM

## 2021-02-14 NOTE — Progress Notes (Signed)
PROGRESS NOTE    Anthony Figueroa   BDZ:[id number]  DOB: 1946-09-26  PCP: Venita Lick, NP    DOA: 02/08/2021 LOS: 5   Brief Narrative   Anthony Figueroa a 75 y.o.malewith medical history significant forcoronary artery disease status post CABG, essential hypertension, hyperlipidemia, paroxysmal A. fibnot on oralanticoagulation, peptic ulcer disease,recently diagnosedprostate cancer with metastases to the bones (January 2022)on oral chemotherapy since6 weeks ago, history of aortic valve replacement,arrhythmia with implantable loop recorder currently present,who presented to [facility] ED after a fall at home early morning 7 AM when he went to the bathroom and was standing and felt dizzy and fell backwards.    Work-up in the ED revealed right-sided7th and 8th ribsfractures and right-sided trace apical pneumothorax. O2 saturation 94% on room air. BP 131/45, heart rate 55, otherwise normal vitals.  Lab studies remarkable for elevated creatinine 1.56. EDP requested admission for pain control.    Assessment & Plan   Active Problems:   Fall   Pneumothorax, right   Fallsecondary to dizziness -  CT head, lumbar spine, pelvis without contrast negative for any acute findings. However showed osseous metastatic disease. Chronic appearing Schmorl's node/superior endplate fracture at L3.   Denies loss of consciousness, but endorsed feeling dizzy prior to falling - orthostatic VIitals + -Discussed with pharmacist.  Stopped losartan yesterday (per wife patient was not supposed to take losartan anyways as this was stopped while back).  Stopping trazodone as that is prone to cause orthostasis -Patient still very dizzy and orthostatic today.  We will start fludrocortisone 0.05 mg once daily Has had prior work-up for dizziness previously at outside institution but nothing significant was found. --Follow up on orthostatic vital signs daily for now --Fall precautions --PT/OT -SNF recommended.   TOC working with wife on placement  Right-sided7th and 8thribs fracture - due to fall above Right-sided apical trace pneumothorax - resolved 3/18 CXR Previously noted pneumothorax is no longer confidently identified. --Repeat chest x-ray if patient develops worsening shortness of breath or chest pain --O2 if needed to maintain sats >90%. --Pain control: scheduled Tylenol, lidocaine patches, scheduled OxyContin 10 mg BID, PRN oxyIR, reserve PRN IV dilaudid for breakthrough pain --Bowel regimen --Incentive spirometry --OT evaluation  Bilateral lower extremity edema - Left > Right.   Chronic left lower extremity swelling secondary to vein graft for CABG.  Lower extremity doppler U/S negative for DVT's bilaterally.  He has some hypoxia which is more likely due to shallow inspirations due to pain from rib fractures.  Clinically doubt pt has PE at this time, but will monitor closely and consider CTA chest.  AKI on CKD stage 3b - POA with Cr 1.56 with increased up to 1.75, up from apparent baseline ~1.44.  AKI likely prerenal due to poor p.o. intake. AKI has resolved with IV hydration-creatinine 0.99 on 3/22 Renal ultrasound was unremarkable. --Monitor BMP daily --Monitor I/O's and for signs of urinary retention --Stop losartan -> could be contributor for orthostasis as well --Continue holding Lasixprn for now  Prostate cancer metastasis to the bones - Follows with hematology oncology, Dr. Tessie [facility]ueroa at [facility]. Pt's wife stated to RN that they were told to stop chemotherapyXtandi which was d/c;d on 3/18 and continued orgovyx 120 mg daily. --Continue orgovyx  --Palliative care consulted, will follow patient after discharge  Coronary disease status post CABG - stable, no active ischemic chest pain.   --Continue home regimen: aspirin, losartan, rosuvastatin and oral LasixPRN.  GERD - continue PPI    DVT prophylaxis:  enoxaparin (LOVENOX) injection 40 mg Start: 02/08/21  2200   Diet:  Diet Orders (From admission, onward)    Start     Ordered   02/08/21 1258  Diet Heart Room service appropriate? Yes; Fluid consistency: Thin  Diet effective now       Question Answer Comment  Room service appropriate? Yes   Fluid consistency: Thin      02/08/21 1257            Code Status: DNR    Subjective 02/14/21    Remains dizzy and orthostatic.  Pain well controlled at this time.  Disposition Plan & Communication   Status is: Inpatient  Inpatient status appropriate as patient has inadequate PO intake, ongoing adjustments to pain control regimen, requires SNF placement  Dispo:  Patient From: Home  Planned Disposition: [place]  Medically stable for discharge: No     Family Communication: none    Consults, Procedures, Significant Events     Antimicrobials:  Anti-infectives (From admission, onward)   None        Micro    Objective   Vitals:   02/14/21 0608 02/14/21 0800 02/14/21 1123 02/14/21 1158  BP: (!) 149/63 (!) 169/67 123/61 (!) 111/59  Pulse: 64 63 73 74  Resp: 16 16 17 18   Temp: (!) 97.5 F (36.4 C) 98 F (36.7 C) 97.6 F (36.4 C) 97.8 F (36.6 C)  TempSrc: Oral Oral Oral   SpO2: 98% 99% 100% 98%  Weight:      Height:        Intake/Output Summary (Last 24 hours) at 02/14/2021 1250 Last data filed at 02/14/2021 9381 Gross per 24 hour  Intake 0 ml  Output 350 ml  Net -350 ml   Filed Weights   02/08/21 0827  Weight: 81.6 kg    Physical Exam:  General exam: awake, alert, no acute distress Respiratory system: CTAB with improved inspiratory ability today, diminished bases, no wheezes, normal respiratory effort. Cardiovascular system: normal S1/S2, RRR, left>right lower extremity edema stable Central nervous system: A&O x3. no gross focal neurologic deficits, normal speech Psychiatry: normal mood, congruent affect, judgement and insight appear normal  Labs   Data Reviewed: I have personally  reviewed following labs and imaging studies  CBC: Recent Labs  Lab 02/08/21 0837 02/09/21 0416 02/10/21 0413 02/11/21 0418 02/12/21 0430 02/13/21 0508 02/14/21 0512  WBC 7.5   < > 6.5 5.6 5.6 5.5 5.9  NEUTROABS 5.3  --   --   --   --   --   --   HGB 11.8*   < > 11.0* 11.5* 12.2* 12.2* 13.3  HCT 38.1*   < > 33.9* 37.4* 38.4* 38.7* 42.4  MCV 93.2   < > 91.4 93.7 91.2 92.8 91.8  PLT 174   < > 146* 159 182 172 190   < > = values in this interval not displayed.   Basic Metabolic Panel: Recent Labs  Lab 02/09/21 0416 02/10/21 0413 02/11/21 0418 02/12/21 0430 02/13/21 0508 02/14/21 0512  NA 141 139 138 139 139 139  K 4.8 5.0 4.6 4.4 4.3 4.0  CL 110 110 111 112* 114* 110  CO2 24 24 19* 21* 22 25  GLUCOSE 113* 96 80 78 86 77  BUN 44* 49* 39* 29* 27* 25*  CREATININE 1.44* 1.75* 1.38* 1.00 0.97 0.99  CALCIUM 8.0* 8.0* 7.9* 8.0* 7.9* 8.1*  MG 1.8 1.9 1.8 1.7 1.8  --   PHOS 4.5 4.3 2.9  2.5  --   --    GFR: Estimated Creatinine Clearance: 68.7 mL/min (by C-G formula based on SCr of 0.99 mg/dL). Liver Function Tests: Recent Labs  Lab 02/08/21 0837  AST 20  ALT 14  ALKPHOS 911*  BILITOT 0.8  PROT 4.5*  ALBUMIN 2.3*   No results for input(s): LIPASE, AMYLASE in the last 168 hours. No results for input(s): AMMONIA in the last 168 hours. Coagulation Profile: No results for input(s): INR, PROTIME in the last 168 hours. Cardiac Enzymes: No results for input(s): CKTOTAL, CKMB, CKMBINDEX, TROPONINI in the last 168 hours. BNP (last 3 results) No results for input(s): PROBNP in the last 8760 hours. HbA1C: No results for input(s): HGBA1C in the last 72 hours. CBG: No results for input(s): GLUCAP in the last 168 hours. Lipid Profile: No results for input(s): CHOL, HDL, LDLCALC, TRIG, CHOLHDL, LDLDIRECT in the last 72 hours. Thyroid Function Tests: No results for input(s): TSH, T4TOTAL, FREET4, T3FREE, THYROIDAB in the last 72 hours. Anemia Panel: No results for input(s):  VITAMINB12, FOLATE, FERRITIN, TIBC, IRON, RETICCTPCT in the last 72 hours. Sepsis Labs: No results for input(s): PROCALCITON, LATICACIDVEN in the last 168 hours.  Recent Results (from the past 240 hour(s))  SARS CORONAVIRUS 2 (TAT 6-24 HRS) Nasopharyngeal Nasopharyngeal Swab     Status: None   Collection Time: 02/08/21 12:35 PM   Specimen: Nasopharyngeal Swab  Result Value Ref Range Status   SARS Coronavirus 2 NEGATIVE NEGATIVE Final    Comment: (NOTE) SARS-CoV-2 target nucleic acids are NOT DETECTED.  The SARS-CoV-2 RNA is generally detectable in upper and lower respiratory specimens during the acute phase of infection. Negative results do not preclude SARS-CoV-2 infection, do not rule out co-infections with other pathogens, and should not be used as the sole basis for treatment or other patient management decisions. Negative results must be combined with clinical observations, patient history, and epidemiological information. The expected result is Negative.  Fact Sheet for Patients: SugarRoll.be  Fact Sheet for Healthcare Providers: https://www.woods-mathews.com/  This test is not yet approved or cleared by the [place] FDA and  has been authorized for detection and/or diagnosis of SARS-CoV-2 by FDA under an Emergency Use Authorization (EUA). This EUA will remain  in effect (meaning this test can be used) for the duration of the COVID-19 declaration under Se ction 564(b)(1) of the Act, 21 U.S.C. section 360bbb-3(b)(1), unless the authorization is terminated or revoked sooner.  Performed at [facility], [place] [address] [place], [place] [address]       Imaging Studies   No results found.   Medications   Scheduled Meds: . acetaminophen  1,000 mg Oral Q8H  . amLODipine  5 mg Oral Daily  . vitamin C  500 mg Oral Daily  . citalopram  20 mg Oral Daily  . enoxaparin (LOVENOX) injection  40 mg Subcutaneous Q24H  .  fludrocortisone  0.05 mg Oral Daily  . iron polysaccharides  150 mg Oral Daily  . lidocaine  1 patch Transdermal Q24H  . oxyCODONE  10 mg Oral BID  . pantoprazole  40 mg Oral BID  . polyethylene glycol  17 g Oral BID  . Relugolix  120 mg Oral Daily  . rosuvastatin  5 mg Oral QHS  . senna-docusate  1 tablet Oral QHS   Continuous Infusions:      LOS: 5 days    Time spent: 25 minutes with >50% spent at bedside and in coordination of care  Max Sane, MD [facility]  02/14/2021, 12:50 PM      If 7PM-7AM, please contact night-coverage. How to contact the [facility] Attending or Consulting provider [place] or covering provider during after hours [place], for this patient?    1. Check the care team in [facility] and look for a) attending/consulting TRH provider listed and b) the [facility] team listed 2. Log into [email or web address] and use [place]'s universal password to access. If you do not have the password, please contact the hospital operator. 3. Locate the [facility] provider you are looking for under [facility] and page to a number that you can be directly reached. 4. If you still have difficulty reaching the provider, please page the [facility] (Director on Call) for the Hospitalists listed on amion for assistance.

## 2021-02-14 NOTE — Progress Notes (Addendum)
Orthostatic vitals were done and patient dropped from 136/65 while sitting to 95/54 after standing. Patient's BP dropped to 76/60 after standing for 3 minutes. Also experienced dizziness and shortness of breath.  Patient recovered after being put back to bed.  Will continue to monitor. Christene Slates  02/14/2021   10:10 PM

## 2021-02-14 NOTE — TOC Progression Note (Addendum)
Transition of Care [facility]) - Progression Note    Patient Details  Name: Anthony Figueroa MRN: [id number] Date of Birth: 05/31/1946  Transition of Care [facility]) CM/SW Contact  Beverly Sessions, RN Phone Number: 02/14/2021, 9:59 AM  Clinical Narrative:    Confirmed with [facility] that they can offer Wife called per patient request and updated on bed offers.  She accepted the bed at [facility].  Accepted in the [place].  [facility] notified   Will need repeat covid test    Expected Discharge Plan: [place] Barriers to Discharge: Continued Medical Work up  Expected Discharge Plan and Services Expected Discharge Plan: [place] In-house Referral: NA   Post Acute Care Choice: [place] Living arrangements for the past 2 months: Single Family Home                         Representative spoke with at DME Agency: n/a         Representative spoke with at [place]: n/a   Social Determinants of Health ([place]) Interventions    Readmission Risk Interventions No flowsheet data found.

## 2021-02-15 DIAGNOSIS — W19XXXA Unspecified fall, initial encounter: Secondary | ICD-10-CM | POA: Diagnosis not present

## 2021-02-15 DIAGNOSIS — C61 Malignant neoplasm of prostate: Secondary | ICD-10-CM | POA: Diagnosis not present

## 2021-02-15 DIAGNOSIS — W19XXXD Unspecified fall, subsequent encounter: Secondary | ICD-10-CM | POA: Diagnosis not present

## 2021-02-15 DIAGNOSIS — Z7189 Other specified counseling: Secondary | ICD-10-CM | POA: Diagnosis not present

## 2021-02-15 DIAGNOSIS — Z515 Encounter for palliative care: Secondary | ICD-10-CM | POA: Diagnosis not present

## 2021-02-15 DIAGNOSIS — J939 Pneumothorax, unspecified: Secondary | ICD-10-CM | POA: Diagnosis not present

## 2021-02-15 MED ORDER — DRONABINOL 2.5 MG PO CAPS
5.0000 mg | ORAL_CAPSULE | Freq: Two times a day (BID) | ORAL | Status: DC
  Administered 2021-02-15 – 2021-02-17 (×5): 5 mg via ORAL
  Filled 2021-02-15 (×5): qty 2

## 2021-02-15 MED ORDER — MIDODRINE HCL 5 MG PO TABS
2.5000 mg | ORAL_TABLET | Freq: Three times a day (TID) | ORAL | Status: DC
  Administered 2021-02-16 (×3): 2.5 mg via ORAL
  Filled 2021-02-15 (×3): qty 1

## 2021-02-15 NOTE — Unmapped (Signed)
Hi,     Douglas Gray contacted the [employer] requesting to speak with the care team of Douglas Gray to discuss:    Calling with questions about medications.    Please contact Ms. Bartow at [phone].    Thank you,   Kelli Hope  [facility]   [phone]

## 2021-02-15 NOTE — Progress Notes (Addendum)
PROGRESS NOTE    Anthony Figueroa   ZOX:[id number]  DOB: 04-10-1946  PCP: Venita Lick, NP    DOA: 02/08/2021 LOS: 6   Brief Narrative   Anthony Figueroa a 75 y.o.malewith medical history significant forcoronary artery disease status post CABG, essential hypertension, hyperlipidemia, paroxysmal A. fibnot on oralanticoagulation, peptic ulcer disease,recently diagnosedprostate cancer with metastases to the bones (January 2022)on oral chemotherapy since6 weeks ago, history of aortic valve replacement,arrhythmia with implantable loop recorder currently present,who presented to [facility] ED after a fall at [facility] morning 7 AM when he went to the bathroom and was standing and felt dizzy and fell backwards.   Work-up in the ED revealed right-sided7th and 8th ribsfractures and right-sided trace apical pneumothorax. O2 saturation 94% on room air. BP 131/45, heart rate 55, otherwise normal vitals.  Lab studies remarkable for elevated creatinine 1.56. EDP requested admission for pain control.    Assessment & Plan   Active Problems:   Fall   Pneumothorax, right   Fallsecondary to dizziness -  CT head, lumbar spine, pelvis without contrast negative for any acute findings. However showed osseous metastatic disease. Chronic appearing Schmorl's node/superior endplate fracture at L3.   Denies loss of consciousness, but endorsed feeling dizzy prior to falling - orthostatic VIitals + -Discussed with pharmacist.  Stopped losartan yesterday (per wife patient was not supposed to take losartan anyways as this was stopped while back).  Stopping trazodone as that is prone to cause orthostasis -Patient still very dizzy and orthostatic today.  We will start fludrocortisone 0.05 mg once daily Has had prior work-up for dizziness previously at outside institution but nothing significant was found. 3/23 - orthostatic vitals still positive --D/C amlodipine / antihypertensives --Follow up on  orthostatic vital signs daily for now --Fall precautions --PT/OT -SNF recommended.  Per TOC has bed when stable for d/c.  Anorexia / Inadequate PO intake - discussed with patient use of Marinol for appetite stimulation.  He was agreeable to try. --Marinol BID AC trial, will continue if well tolerated and helpful  Right-sided7th and 8thribs fracture - due to fall above Right-sided apical trace pneumothorax - resolved 3/18 CXRPreviously noted pneumothorax is no longer confidently identified. --Repeat chest x-ray if patient develops worsening shortness of breath or chest pain --O2 if needed to maintain sats >90%. --Pain control: scheduled Tylenol, lidocaine patches, scheduled OxyContin 10 mg BID, PRN oxyIR, reserve PRN IV dilaudid for breakthrough pain --Bowel regimen --Incentive spirometry --OT evaluation  Bilateral lower extremity edema - Left > Right.   Chronic left lower extremity swelling secondary to vein graft for CABG.  Lower extremity doppler U/S negative for DVT's bilaterally.  He has some hypoxia which is more likely due to shallow inspirations due to pain from rib fractures.  Clinically doubt pt has PE at this time, but will monitor closely and consider CTA chest.  AKI onCKD stage 3b - AKI resolved with IV hydration. AKI POA with Cr 1.56 with increased up to 1.75, up from apparent baseline ~1.44.  AKI likely prerenal due to poor p.o. intake.  Renal ultrasound was unremarkable. --Monitor BMP daily --Monitor I/O's and for signs of urinary retention --Stop losartan -> could be contributor for orthostasis as well --Continue holding Lasixprn for now  Prostate cancer metastasis to the bones - Follows with hematology oncology, Dr. Tessie Eke at [facility]. Pt's wife stated to RN that they were told to stopchemotherapyXtandiwhich was d/c;d on 3/18 and continuedorgovyx 120 mg daily. --Continue orgovyx  --Palliative care consulted, will follow patient  after discharge  Coronary  disease status post CABG - stable, no active ischemic chest pain.   --Continue home regimen: aspirin, losartan, rosuvastatin and oral LasixPRN.  GERD - continue PPI   Patient BMI: Body mass index is 25.12 kg/m.   DVT prophylaxis: enoxaparin (LOVENOX) injection 40 mg Start: 02/08/21 2200   Diet:  Diet Orders (From admission, onward)    Start     Ordered   02/08/21 1258  Diet Heart Room service appropriate? Yes; Fluid consistency: Thin  Diet effective now       Question Answer Comment  Room service appropriate? Yes   Fluid consistency: Thin      02/08/21 1257            Code Status: DNR    Subjective 02/15/21    Pt continues to have very poor appetite, says foods just don't taste right.  Pain currently okay, controlled.  No SOB, F/C, N/V.  Had BM's yesterday.   Disposition Plan & Communication   Status is: Inpatient  Inpatient status remains appropriate due to severity of illness, with orthostatic hypotension and medication adjustments ongoing for this. Also has inadequate oral intake to sustain himself.    Dispo:  Patient From: Home  Planned Disposition: [place]  Medically stable for discharge: No      Family Communication: wife at bedside on rounds today   Consults, Procedures, Significant Events   Consultants:   Palliative Care   Antimicrobials:  Anti-infectives (From admission, onward)   None        Micro    Objective   Vitals:   02/15/21 0851 02/15/21 0900 02/15/21 1115 02/15/21 1537  BP:   (!) 118/51 (!) 128/59  Pulse:   64 66  Resp:   18 18  Temp: 97.6 F (36.4 C)  (!) 97.5 F (36.4 C) (!) 97.3 F (36.3 C)  TempSrc: Oral  Oral Oral  SpO2:  98% 99% 99%  Weight:      Height:        Intake/Output Summary (Last 24 hours) at 02/15/2021 1721 Last data filed at 02/15/2021 1415 Gross per 24 hour  Intake 120 ml  Output 275 ml  Net -155 ml   Filed Weights   02/08/21 0827  Weight: 81.6 kg    Physical  Exam:  General exam: awake, alert, no acute distress Respiratory system: CTAB, diminished bases, normal respiratory effort. Cardiovascular system: normal S1/S2, RRR, no pedal edema.   Gastrointestinal system: soft, NT, ND, +BS Central nervous system: A&O x4. no gross focal neurologic deficits, normal speech Extremities: moves all*, no edema, normal tone Psychiatry: normal mood, congruent affect, judgement and insight appear normal  Labs   Data Reviewed: I have personally reviewed following labs and imaging studies  CBC: Recent Labs  Lab 02/10/21 0413 02/11/21 0418 02/12/21 0430 02/13/21 0508 02/14/21 0512  WBC 6.5 5.6 5.6 5.5 5.9  HGB 11.0* 11.5* 12.2* 12.2* 13.3  HCT 33.9* 37.4* 38.4* 38.7* 42.4  MCV 91.4 93.7 91.2 92.8 91.8  PLT 146* 159 182 172 124   Basic Metabolic Panel: Recent Labs  Lab 02/09/21 0416 02/10/21 0413 02/11/21 0418 02/12/21 0430 02/13/21 0508 02/14/21 0512  NA 141 139 138 139 139 139  K 4.8 5.0 4.6 4.4 4.3 4.0  CL 110 110 111 112* 114* 110  CO2 24 24 19* 21* 22 25  GLUCOSE 113* 96 80 78 86 77  BUN 44* 49* 39* 29* 27* 25*  CREATININE 1.44* 1.75* 1.38* 1.00 0.97  0.99  CALCIUM 8.0* 8.0* 7.9* 8.0* 7.9* 8.1*  MG 1.8 1.9 1.8 1.7 1.8  --   PHOS 4.5 4.3 2.9 2.5  --   --    GFR: Estimated Creatinine Clearance: 68.7 mL/min (by C-G formula based on SCr of 0.99 mg/dL). Liver Function Tests: No results for input(s): AST, ALT, ALKPHOS, BILITOT, PROT, ALBUMIN in the last 168 hours. No results for input(s): LIPASE, AMYLASE in the last 168 hours. No results for input(s): AMMONIA in the last 168 hours. Coagulation Profile: No results for input(s): INR, PROTIME in the last 168 hours. Cardiac Enzymes: No results for input(s): CKTOTAL, CKMB, CKMBINDEX, TROPONINI in the last 168 hours. BNP (last 3 results) No results for input(s): PROBNP in the last 8760 hours. HbA1C: No results for input(s): HGBA1C in the last 72 hours. CBG: No results for input(s): GLUCAP  in the last 168 hours. Lipid Profile: No results for input(s): CHOL, HDL, LDLCALC, TRIG, CHOLHDL, LDLDIRECT in the last 72 hours. Thyroid Function Tests: No results for input(s): TSH, T4TOTAL, FREET4, T3FREE, THYROIDAB in the last 72 hours. Anemia Panel: No results for input(s): VITAMINB12, FOLATE, FERRITIN, TIBC, IRON, RETICCTPCT in the last 72 hours. Sepsis Labs: No results for input(s): PROCALCITON, LATICACIDVEN in the last 168 hours.  Recent Results (from the past 240 hour(s))  SARS CORONAVIRUS 2 (TAT 6-24 HRS) Nasopharyngeal Nasopharyngeal Swab     Status: None   Collection Time: 02/08/21 12:35 PM   Specimen: Nasopharyngeal Swab  Result Value Ref Range Status   SARS Coronavirus 2 NEGATIVE NEGATIVE Final    Comment: (NOTE) SARS-CoV-2 target nucleic acids are NOT DETECTED.  The SARS-CoV-2 RNA is generally detectable in upper and lower respiratory specimens during the acute phase of infection. Negative results do not preclude SARS-CoV-2 infection, do not rule out co-infections with other pathogens, and should not be used as the sole basis for treatment or other patient management decisions. Negative results must be combined with clinical observations, patient history, and epidemiological information. The expected result is Negative.  Fact Sheet for Patients: SugarRoll.be  Fact Sheet for Healthcare Providers: https://www.woods-mathews.com/  This test is not yet approved or cleared by the [place] FDA and  has been authorized for detection and/or diagnosis of SARS-CoV-2 by FDA under an Emergency Use Authorization (EUA). This EUA will remain  in effect (meaning this test can be used) for the duration of the COVID-19 declaration under Se ction 564(b)(1) of the Act, 21 U.S.C. section 360bbb-3(b)(1), unless the authorization is terminated or revoked sooner.  Performed at [facility], [place] [address] [place],  [place] [address]       Imaging Studies   No results found.   Medications   Scheduled Meds:  acetaminophen  1,000 mg Oral Q8H   vitamin C  500 mg Oral Daily   citalopram  20 mg Oral Daily   dronabinol  5 mg Oral BID AC   enoxaparin (LOVENOX) injection  40 mg Subcutaneous Q24H   fludrocortisone  0.05 mg Oral Daily   iron polysaccharides  150 mg Oral Daily   lidocaine  1 patch Transdermal Q24H   oxyCODONE  10 mg Oral BID   pantoprazole  40 mg Oral BID   polyethylene glycol  17 g Oral BID   Relugolix  120 mg Oral Daily   rosuvastatin  5 mg Oral QHS   senna-docusate  1 tablet Oral QHS   Continuous Infusions:     LOS: 6 days    Time spent: 25  minutes    Ezekiel Slocumb, DO [facility]  02/15/2021, 5:21 PM      If 7PM-7AM, please contact night-coverage. How to contact the [facility] Attending or Consulting provider [place] or covering provider during after hours [place], for this patient?    1. Check the care team in [facility] and look for a) attending/consulting TRH provider listed and b) the [facility] team listed 2. Log into [email or web address] and use [place]'s universal password to access. If you do not have the password, please contact the hospital operator. 3. Locate the [facility] provider you are looking for under [facility] and page to a number that you can be directly reached. 4. If you still have difficulty reaching the provider, please page the [facility] (Director on Call) for the Hospitalists listed on amion for assistance.

## 2021-02-15 NOTE — Progress Notes (Signed)
Physical Therapy Treatment Patient Details Name: Anthony Figueroa MRN: [id number] DOB: 06/14/1946 Today's Date: 02/15/2021    History of Present Illness Zyier Dykema is a 75 y.o. male with medical history significant for coronary artery disease status post CABG, essential hypertension, hyperlipidemia, paroxysmal A. 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 Discussed with MD who cleared pt for participation with stable pneumothorax.    PT Comments    Pt ready for session.  Pt remains orthostatic with nursing BP checks this am.  Pt confirms and endorsed occasional dizziness in supine with exercises.  Participated in exercises as described below.  Deferred further mobility at this time as he was recently up with nursing for orthostatic checks and remains limited by BP.  Pt educated and encouraged to do HEP several times each day.  Voiced understanding.   Follow Up Recommendations        Equipment Recommendations       Recommendations for Other Services       Precautions / Restrictions Precautions Precautions: Fall Precaution Comments: R rib fx and pneumothorax Restrictions Weight Bearing Restrictions: No    Mobility  Bed Mobility               General bed mobility comments: deferred    Transfers                    Ambulation/Gait                 Stairs             Wheelchair Mobility    Modified Rankin (Stroke Patients Only)       Balance                                            Cognition Arousal/Alertness: Awake/alert Behavior During Therapy: WFL for  tasks assessed/performed Overall Cognitive Status: Within Functional Limits for tasks assessed                                        Exercises Other Exercises Other Exercises: BLE AAROM RLE, AROM LLE x 10 in supine    General Comments        Pertinent Vitals/Pain Pain Assessment: Faces Faces Pain Scale: Hurts little more Pain Location: B thighs and legs Pain Descriptors / Indicators: Discomfort;Dull;Grimacing Pain Intervention(s): Limited activity within patient's tolerance;Monitored during session    Home Living                      Prior Function            PT Goals (current goals can now be found in the care plan section) Progress towards PT goals: Progressing toward goals    Frequency           PT Plan Current plan remains appropriate    Co-evaluation  AM-PAC PT "6 Clicks" Mobility   Outcome Measure  Help needed turning from your back to your side while in a flat bed without using bedrails?: A Lot Help needed moving from lying on your back to sitting on the side of a flat bed without using bedrails?: A Lot Help needed moving to and from a bed to a chair (including a wheelchair)?: Total Help needed standing up from a chair using your arms (e.g., wheelchair or bedside chair)?: Total Help needed to walk in hospital room?: Total Help needed climbing 3-5 steps with a railing? : Total 6 Click Score: 8    End of Session Equipment Utilized During Treatment: Oxygen Activity Tolerance: Treatment limited secondary to medical complications (Comment);Other (comment) Patient left: in bed;with bed alarm set;with call bell/phone within reach;with family/visitor present Nurse Communication: Mobility status PT Visit Diagnosis: Unsteadiness on feet (R26.81);Muscle weakness (generalized) (M62.81);History of falling (Z91.81);Difficulty in walking, not elsewhere classified (R26.2);Pain;Dizziness and giddiness (R42)     Time:  0102-7253 PT Time Calculation (min) (ACUTE ONLY): 12 min  Charges:  $Therapeutic Exercise: 8-22 mins                    Chesley Noon, PTA 02/15/21, 12:52 PM

## 2021-02-15 NOTE — Progress Notes (Signed)
Daily Progress Note   Patient Name: Anthony Figueroa       Date: 02/15/2021 DOB: 1946/08/19  Age: 75 y.o. MRN#: [id number] Attending Physician: Ezekiel Slocumb, DO Primary Care Physician: Venita Lick, NP Admit Date: 02/08/2021  Reason for Consultation/Follow-up: Establishing goals of care  Subjective: Feels better  Length of Stay: 6  Current Medications: Scheduled Meds:  . acetaminophen  1,000 mg Oral Q8H  . vitamin C  500 mg Oral Daily  . citalopram  20 mg Oral Daily  . dronabinol  5 mg Oral BID AC  . enoxaparin (LOVENOX) injection  40 mg Subcutaneous Q24H  . fludrocortisone  0.05 mg Oral Daily  . iron polysaccharides  150 mg Oral Daily  . lidocaine  1 patch Transdermal Q24H  . oxyCODONE  10 mg Oral BID  . pantoprazole  40 mg Oral BID  . polyethylene glycol  17 g Oral BID  . Relugolix  120 mg Oral Daily  . rosuvastatin  5 mg Oral QHS  . senna-docusate  1 tablet Oral QHS    Continuous Infusions:   PRN Meds: bisacodyl, bisacodyl, HYDROmorphone (DILAUDID) injection, ondansetron (ZOFRAN) IV, oxyCODONE  Physical Exam Constitutional:      General: He is not in acute distress.    Comments: pale  Pulmonary:     Effort: Pulmonary effort is normal.  Skin:    General: Skin is warm and dry.  Neurological:     Mental Status: He is alert and oriented to person, place, and time.     Comments: Easily confused, forgetful             Vital Signs: BP (!) 118/51 (BP Location: Left Arm)   Pulse 64   Temp (!) 97.5 F (36.4 C) (Oral)   Resp 18   Ht 5' 10.98" (1.803 m)   Wt 81.6 kg   SpO2 99%   BMI 25.12 kg/m  SpO2: SpO2: 99 % O2 Device: O2 Device: Nasal Cannula O2 Flow Rate: O2 Flow Rate (L/min): 2 L/min  Intake/output summary:   Intake/Output Summary (Last 24 hours) at 02/15/2021  1349 Last data filed at 02/15/2021 1017 Gross per 24 hour  Intake 0 ml  Output 275 ml  Net -275 ml   LBM: Last BM Date: 02/14/21 Baseline Weight: Weight: 81.6 kg Most recent weight: Weight: 81.6 kg       Palliative Assessment/Data: PPS 50%    Flowsheet Rows   Flowsheet Row Most Recent Value  Intake Tab   Referral Department Hospitalist  Unit at Time of Referral Med/Surg Unit  Palliative Care Primary Diagnosis Cancer  Date Notified 02/09/21  Palliative Care Type New Palliative care  Reason for referral Clarify Goals of Care  Date of Admission 02/08/21  Date first seen by Palliative Care 02/09/21  # of days Palliative referral response time 0 Day(s)  # of days IP prior to Palliative referral 1  Clinical Assessment   Palliative Performance Scale Score 50%  Psychosocial & Spiritual Assessment   Palliative Care Outcomes   Patient/Family meeting held? Yes  Who was at the meeting? wife and patient      Patient Active Problem List   Diagnosis Date Noted  . Pneumothorax,  right 02/09/2021  . Fall 02/08/2021  . Prostate cancer metastatic to bone ([place]) 12/19/2020  . PUD (peptic ulcer disease) 10/11/2020  . Paroxysmal atrial fibrillation ([place]) 02/22/2020  . Cigarette smoker 07/06/2019  . Bilateral carotid artery stenosis 01/05/2019  . Coronary artery disease involving native coronary artery of native heart without angina pectoris 01/05/2019  . Hx of CABG 01/05/2019  . Hyperlipidemia LDL goal <70 01/05/2019  . PVD (peripheral vascular disease) ([place]) 01/05/2019  . Prediabetes 01/05/2019  . History of aortic valve replacement 10/14/2015  . Essential hypertension 10/14/2015    Palliative Care Assessment & Plan   HPI: 75 y.o.malewith past medical history of CAD s/p CABG, HTN, HLD, a fib not on AC, PUD, bovine AVR, arrhythmia with implantable loop recorder, and recently diagnosed prostate cancer with mets to bones (dx Jan 2022) on oral chemo for 6 weeks presentadmitted on  3/16/2022with a fall.Found to have right-sided7th and 8th ribsfractures and right-sided trace apical pneumothorax. Patient reports ongoing dizziness - previously had workup with no significant findings. Also with bilateral lower ext edema - left > right - bilat lower ext [place] neg for DVT. PMT consulted for [employer] discussion.  Assessment: Follow up today with [name].   He tells me he is feeling better. Dizziness is improved. Pain adequately controlled by medications. Sleeping well. Marinol has been ordered for appetite per patient.   He tells me of plans to d/c to SNF - [facility] - soon. He is agreeable  Recommendations/Plan:  Dc to SNF with palliative to follow - liaison aware  Symptoms well controlled per patient  DNR but open to other interventions/rehospitalization if needed  Code Status:  DNR  Prognosis:   Unable to determine  Discharge Planning:  [place] for rehab with Palliative care service follow-up  Care plan was discussed with patient  Thank you for allowing the Palliative Medicine Team to assist in the care of this patient.   Total Time 15 minutes Prolonged Time Billed  no       Greater than 50%  of this time was spent counseling and coordinating care related to the above assessment and plan.  Juel Burrow, DNP, [facility] Palliative Medicine Team Team Phone # [phone]  Pager [phone]

## 2021-02-16 DIAGNOSIS — C7951 Secondary malignant neoplasm of bone: Principal | ICD-10-CM

## 2021-02-16 DIAGNOSIS — C61 Malignant neoplasm of prostate: Principal | ICD-10-CM

## 2021-02-16 DIAGNOSIS — S2249XA Multiple fractures of ribs, unspecified side, initial encounter for closed fracture: Secondary | ICD-10-CM | POA: Diagnosis present

## 2021-02-16 DIAGNOSIS — M6281 Muscle weakness (generalized): Secondary | ICD-10-CM | POA: Diagnosis present

## 2021-02-16 DIAGNOSIS — J939 Pneumothorax, unspecified: Secondary | ICD-10-CM | POA: Diagnosis not present

## 2021-02-16 DIAGNOSIS — I951 Orthostatic hypotension: Secondary | ICD-10-CM | POA: Diagnosis present

## 2021-02-16 DIAGNOSIS — R42 Dizziness and giddiness: Secondary | ICD-10-CM

## 2021-02-16 LAB — RESP PANEL BY RT-PCR (FLU A&B, COVID) ARPGX2
Influenza A by PCR: NEGATIVE
Influenza B by PCR: NEGATIVE
SARS Coronavirus 2 by RT PCR: NEGATIVE

## 2021-02-16 MED ORDER — MECLIZINE HCL 25 MG PO TABS
25.0000 mg | ORAL_TABLET | Freq: Three times a day (TID) | ORAL | Status: DC | PRN
  Filled 2021-02-16: qty 1

## 2021-02-16 MED ORDER — RELUGOLIX 120 MG TABLET
ORAL_TABLET | Freq: Every day | ORAL | 5 refills | 30 days | Status: CP
Start: 2021-02-16 — End: ?

## 2021-02-16 NOTE — Unmapped (Signed)
Called Douglas Gray to let her know script for Orgovyx has been sent to [facility]. [name] CPP will check with them to see if med can be urgently processed. Asked her to call back with any further questions or concerns.

## 2021-02-16 NOTE — Unmapped (Signed)
Called [facility] they have the relugolix prescription and I asked if they could urgently process as patient is out of drug. They said they would reach out to patient shortly to set up delivery.    Laverna Peace PharmD, BCOP, CPP  Hematology/Oncology Pharmacist  P: [phone]

## 2021-02-16 NOTE — Unmapped (Signed)
Returned call to [facility]. Had received a script for [facility] from [facility].Let pharmacist know drug is on hold for now. He will cancel the order and a new one can be sent if restarted.Will call back with any further needs.

## 2021-02-16 NOTE — Unmapped (Signed)
Hi,    Patient Douglas Gray called requesting a medication refill for the following:    ??? Medication: Xtandi  ??? Dosage: 80 mg  ??? Days left of medication: 0  ??? [facility]acy[facility]    The pharmacy received the prescriptions from another pharmacy.     The expected turnaround time is 3-4 business days       Check Indicates criteria has been reviewed and confirmed with the patient:    []  Preferred Name   []  DOB and/or MR#  []  Preferred Contact Method  []  Phone Number(s)   []  Preferred Pharmacy   []  MyChart     Thank you,  Drema Balzarine  [facility]  [phone]

## 2021-02-16 NOTE — Progress Notes (Signed)
Occupational Therapy Treatment Patient Details Name: Anthony Figueroa MRN: [id number] DOB: [birth year] Today's Date: 02/16/2021    History of present illness Anthony Figueroa is a 75 y.o. male with medical history significant for coronary artery disease status post CABG, essential hypertension, hyperlipidemia, paroxysmal A. 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 Discussed with MD who cleared pt for participation with stable pneumothorax.   OT comments  [name] presents today with generalized weakness, reduced endurance, and significant pain, limited his ability to engage in functional mobility tasks. He is A&O x 4, very talkative, in some distress re: his situation of "pain upon pain" (ie, pain from fall/broken ribs on top of CA pain). He requires MaxA +2 for toileting (bedpan) and for bed mobility/linen change. He appears to be making good effort to assist with mobility, but is largely unable to do so, including requiring Total A to move R LE. Pt reports 10/10 pain with movement, reduced to 8.5/10 when repositioned in bed at end of session. DC to SNF remains appropriate plan for this pt.    Follow Up Recommendations  SNF    Equipment Recommendations       Recommendations for Other Services      Precautions / Restrictions Precautions Precautions: Fall Precaution Comments: R rib fx and pneumothorax Restrictions Weight Bearing Restrictions: No       Mobility Bed Mobility Overal bed mobility: Needs Assistance Bed Mobility: Rolling Rolling: +2 for physical assistance;Max assist         General bed mobility comments:  Max +2 A for rolling in bed for linen change; Total A for moving R LE    Transfers                 General transfer comment: declined, 2/2 pain    Balance Overall balance assessment: Needs assistance Sitting-balance support: Bilateral upper extremity supported;Feet supported Sitting balance-Leahy Scale: Fair                                     ADL either performed or assessed with clinical judgement   ADL Overall ADL's : Needs assistance/impaired                         Toilet Transfer: Maximal assistance;+2 for physical assistance                   Vision Patient Visual Report: No change from baseline     Perception     Praxis      Cognition Arousal/Alertness: Awake/alert Behavior During Therapy: WFL for tasks assessed/performed Overall Cognitive Status: Within Functional Limits for tasks assessed                                          Exercises     Shoulder Instructions       General Comments neuropathy, R LE    Pertinent Vitals/ Pain       Pain  Score: 9  Pain Location: back, ribs Pain Descriptors / Indicators: Grimacing;Penetrating;Sharp Pain Intervention(s): Limited activity within patient's tolerance;Monitored during session;Patient requesting pain meds-RN notified;Repositioned  Home Living                                          Prior Functioning/Environment              Frequency  Min 1X/week        Progress Toward Goals  OT Goals(current goals can now be found in the care plan section)  Progress towards OT goals: Progressing toward goals  Acute Rehab OT Goals Patient Stated Goal: To improve pain OT Goal Formulation: With patient/family Time For Goal Achievement: 02/23/21 Potential to Achieve Goals: [place] Discharge plan remains appropriate    Co-evaluation                 AM-PAC OT "6 Clicks" Daily Activity     Outcome Measure   Help from  another person eating meals?: A Little Help from another person taking care of personal grooming?: A Little Help from another person toileting, which includes using toliet, bedpan, or urinal?: A Lot Help from another person bathing (including washing, rinsing, drying)?: A Lot Help from another person to put on and taking off regular upper body clothing?: A Lot Help from another person to put on and taking off regular lower body clothing?: A Lot 6 Click Score: 14    End of Session    OT Visit Diagnosis: Unsteadiness on feet (R26.81);Other abnormalities of gait and mobility (R26.89);Muscle weakness (generalized) (M62.81)   Activity Tolerance Patient tolerated treatment well;Patient limited by pain   Patient Left in bed;with call bell/phone within reach;with bed alarm set   Nurse Communication Patient requests pain meds        Time: 9449-6759 OT Time Calculation (min): 23 min  Charges: OT General Charges $OT Visit: 1 Visit OT Treatments $Self Care/Home Management : 23-37 mins  Josiah Lobo, PhD, MS, OTR/L 02/16/21, 3:48 PM

## 2021-02-16 NOTE — Progress Notes (Addendum)
PROGRESS NOTE    Anthony Figueroa   ASN:[id number]  DOB: [birth year]  PCP: Anthony Lick, NP    DOA: 02/08/2021 LOS: 7   Brief Narrative   Anthony Figueroa a 75 y.o.malewith medical history significant forcoronary artery disease status post CABG, essential hypertension, hyperlipidemia, paroxysmal A. 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   Work-up in the ED revealed right-sided7th and 8th ribsfractures and right-sided trace apical pneumothorax. O2 saturation 94% on room air. BP 131/45, heart rate 55, otherwise normal vitals.  Lab studies remarkable for elevated creatinine 1.56. EDP requested admission for pain control.    Assessment & Plan   Active Problems:   Essential hypertension   Hx of CABG   Paroxysmal atrial fibrillation (HCC)   Prostate cancer metastatic to bone [facility])   PVD (peripheral vascular disease) ([place])   Fall   Pneumothorax, right   Ribs, multiple fractures   Generalized muscle weakness   Dizziness   Orthostatic hypotension   Fallsecondary to dizziness -  CT head, lumbar spine, pelvis without contrast negative for any acute findings. However showed osseous metastatic disease. Chronic appearing Schmorl's node/superior endplate fracture at L3.   Denies loss of consciousness, but endorsed feeling dizzy prior to falling - orthostatic VIitals + -Discussed with pharmacist.   -Stopped losartan (per wife patient was not supposed to take losartan anyways as this was stopped while back).   -Stopped trazodone as that is prone to cause orthostasis -Started on midodrine 2.5 mg TID, will  increase if BP no too high supine 3/23 - orthostatic vitals still positive 3/24 - orthostatics unable to be obtained --D/C amlodipine / antihypertensives --Follow up on orthostatic vital signs daily for now --Fall precautions --PT/OT -SNF recommended.  Per TOC has bed when stable for d/c.  Anorexia / Inadequate PO intake - discussed with patient use of Marinol for appetite stimulation.  He was agreeable to try. --Marinol BID AC started - seems well tolerated  Generalized weakness / debility - due to metastatic disease and other chronic comorbidities, as well as poor PO intake. --SNF for rehab on d/c  Right-sided7th and 8thribs fracture - due to fall above Right-sided apical trace pneumothorax - resolved 3/18 CXRPreviously noted pneumothorax is no longer confidently identified. --Repeat chest x-ray if patient develops worsening shortness of breath or chest pain --O2 if needed to maintain sats >90%. --Pain control: scheduled Tylenol, lidocaine patches, scheduled OxyContin 10 mg BID, PRN oxyIR, reserve PRN IV dilaudid for breakthrough pain --Bowel regimen --Incentive spirometry --OT evaluation - SNF recommended  Bilateral lower extremity edema - Left > Right.   Chronic left lower extremity swelling secondary to vein graft for CABG.  Lower extremity doppler U/S negative for DVT's bilaterally.  He has some hypoxia which is more likely due to shallow inspirations due to pain from rib fractures.  Clinically doubt pt has PE at this time, but will monitor closely and consider CTA chest.  AKI onCKD stage 3b - AKI resolved with IV hydration. AKI POA with Cr 1.56 with increased up to 1.75, up from apparent baseline ~1.44.  AKI likely prerenal due to poor p.o. intake.  Renal ultrasound was unremarkable. --Monitor BMP daily --Monitor I/O's and for signs of  urinary retention --Stop losartan -> could be contributor for orthostasis as well --Continue holding Lasixprn for now  Prostate  cancer metastasis to the bones - Follows with hematology oncology, Dr. Tessie [facility]uero[facility] at [facility]. Pt's wife stated to RN that they were told to stopchemotherapyXtandiwhich was d/c;d on 3/18 and continuedorgovyx 120 mg daily. --Continue orgovyx  --Palliative care consulted --Palliative to follow patient after discharge  Coronary disease status post CABG - stable, no active ischemic chest pain.   --Continue home regimen: aspirin, losartan, rosuvastatin and oral LasixPRN.  GERD - continue PPI   Patient BMI: Body mass index is 25.12 kg/m.   DVT prophylaxis: enoxaparin (LOVENOX) injection 40 mg Start: 02/08/21 2200   Diet:  Diet Orders (From admission, onward)    Start     Ordered   02/08/21 1258  Diet Heart Room service appropriate? Yes; Fluid consistency: Thin  Diet effective now       Question Answer Comment  Room service appropriate? Yes   Fluid consistency: Thin      02/08/21 1257            Code Status: DNR    Subjective 02/16/21    Patient today reports feeling okay.  Starting to be able to take deeper breaths but still has rib cage pain.  Per nursing today, patient was unable to tolerate standing more than a few seconds due to profound weakness in his legs and also dizziness, but orthostatic vitals were negative today however.   Disposition Plan & Communication   Status is: Inpatient  Inpatient status remains appropriate due to severity of illness, with ongoing dizziness upon standing. Also has inadequate oral intake.    Dispo:  Patient From: Home  Planned Disposition: [place]  Medically stable for discharge: No      Family Communication: wife has been bedside during rounds,, not present during encounter today   Consults, Procedures, Significant Events   Consultants:   Palliative Care   Antimicrobials:  Anti-infectives (From admission, onward)   None        Micro    Objective   Vitals:   02/15/21 2004 02/16/21 0438  02/16/21 0757 02/16/21 1606  BP: (!) 124/51 (!) 134/59 (!) 146/59 (!) 139/53  Pulse: 65 63 61 (!) 58  Resp: 16 16  18   Temp: (!) 97.5 F (36.4 C) (!) 97.4 F (36.3 C) (!) 97.5 F (36.4 C) 97.6 F (36.4 C)  TempSrc: Oral Oral Oral Oral  SpO2: 96% 97% 98% 98%  Weight:      Height:        Intake/Output Summary (Last 24 hours) at 02/16/2021 1633 Last data filed at 02/16/2021 1300 Gross per 24 hour  Intake 120 ml  Output 350 ml  Net -230 ml   Filed Weights   02/08/21 0827  Weight: 81.6 kg    Physical Exam:  General exam: awake, alert, no acute distress Respiratory system: Diminished, faint crackles, normal respiratory effort. Cardiovascular system: normal S1/S2, RRR Central nervous system: A&O x4. no gross focal neurologic deficits, normal speech Extremities: moves all, no edema, normal tone Psychiatry: Normal mood, congruent affect, judgment insight appear intact  Labs   Data Reviewed: I have personally reviewed following labs and imaging studies  CBC: Recent Labs  Lab 02/10/21 0413 02/11/21 0418 02/12/21 0430 02/13/21 0508 02/14/21 0512  WBC 6.5 5.6 5.6 5.5 5.9  HGB 11.0* 11.5* 12.2* 12.2* 13.3  HCT 33.9* 37.4* 38.4* 38.7* 42.4  MCV 91.4 93.7 91.2 92.8 91.8  PLT 146* 159 182 172 161   Basic Metabolic Panel: Recent Labs  Lab 02/10/21 0413 02/11/21 0418 02/12/21 0430 02/13/21 0508 02/14/21 0512  NA 139 138 139 139 139  K 5.0 4.6 4.4 4.3 4.0  CL 110 111 112* 114* 110  CO2 24 19* 21* 22 25  GLUCOSE 96 80 78 86 77  BUN 49* 39* 29* 27* 25*  CREATININE 1.75* 1.38* 1.00 0.97 0.99  CALCIUM 8.0* 7.9* 8.0* 7.9* 8.1*  MG 1.9 1.8 1.7 1.8  --   PHOS 4.3 2.9 2.5  --   --    GFR: Estimated Creatinine Clearance: 68.7 mL/min (by C-G formula based on SCr of 0.99 mg/dL). Liver Function Tests: No results for input(s): AST, ALT, ALKPHOS, BILITOT, PROT, ALBUMIN in the last 168 hours. No results for input(s): LIPASE, AMYLASE in the last 168 hours. No results for  input(s): AMMONIA in the last 168 hours. Coagulation Profile: No results for input(s): INR, PROTIME in the last 168 hours. Cardiac Enzymes: No results for input(s): CKTOTAL, CKMB, CKMBINDEX, TROPONINI in the last 168 hours. BNP (last 3 results) No results for input(s): PROBNP in the last 8760 hours. HbA1C: No results for input(s): HGBA1C in the last 72 hours. CBG: No results for input(s): GLUCAP in the last 168 hours. Lipid Profile: No results for input(s): CHOL, HDL, LDLCALC, TRIG, CHOLHDL, LDLDIRECT in the last 72 hours. Thyroid Function Tests: No results for input(s): TSH, T4TOTAL, FREET4, T3FREE, THYROIDAB in the last 72 hours. Anemia Panel: No results for input(s): VITAMINB12, FOLATE, FERRITIN, TIBC, IRON, RETICCTPCT in the last 72 hours. Sepsis Labs: No results for input(s): PROCALCITON, LATICACIDVEN in the last 168 hours.  Recent Results (from the past 240 hour(s))  SARS CORONAVIRUS 2 (TAT 6-24 HRS) Nasopharyngeal Nasopharyngeal Swab     Status: None   Collection Time: 02/08/21 12:35 PM   Specimen: Nasopharyngeal Swab  Result Value Ref Range Status   SARS Coronavirus 2 NEGATIVE NEGATIVE Final    Comment: (NOTE) SARS-CoV-2 target nucleic acids are NOT DETECTED.  The SARS-CoV-2 RNA is generally detectable in upper and lower respiratory specimens during the acute phase of infection. Negative results do not preclude SARS-CoV-2 infection, do not rule out co-infections with other pathogens, and should not be used as the sole basis for treatment or other patient management decisions. Negative results must be combined with clinical observations, patient history, and epidemiological information. The expected result is Negative.  Fact Sheet for Patients: SugarRoll.be  Fact Sheet for Healthcare Providers: https://www.woods-mathews.com/  This test is not yet approved or cleared by the [place] FDA and  has been authorized for  detection and/or diagnosis of SARS-CoV-2 by FDA under an Emergency Use Authorization (EUA). This EUA will remain  in effect (meaning this test can be used) for the duration of the COVID-19 declaration under Se ction 564(b)(1) of the Act, 21 U.S.C. section 360bbb-3(b)(1), unless the authorization is terminated or revoked sooner.  Performed at [facility], [place] [address] [place], [place] [address]   Resp Panel by RT-PCR (Flu A&B, Covid) Nasopharyngeal Swab     Status: None   Collection Time: 02/16/21 12:39 PM   Specimen: Nasopharyngeal Swab; Nasopharyngeal(NP) swabs in vial transport medium  Result Value Ref Range Status   SARS Coronavirus 2 by RT PCR NEGATIVE NEGATIVE Final    Comment: (NOTE) SARS-CoV-2 target nucleic acids are NOT DETECTED.  The SARS-CoV-2 RNA is generally detectable in upper respiratory specimens during the acute phase of infection. The lowest concentration  of SARS-CoV-2 viral copies this assay can detect is 138 copies/mL. A negative result does not preclude SARS-Cov-2 infection and should not be used as the sole basis for treatment or other patient management decisions. A negative result may occur with  improper specimen collection/handling, submission of specimen other than nasopharyngeal swab, presence of viral mutation(s) within the areas targeted by this assay, and inadequate number of viral copies(<138 copies/mL). A negative result must be combined with clinical observations, patient history, and epidemiological information. The expected result is Negative.  Fact Sheet for Patients:  EntrepreneurPulse.com.au  Fact Sheet for Healthcare Providers:  IncredibleEmployment.be  This test is no t yet approved or cleared by the [place] FDA and  has been authorized for detection and/or diagnosis of SARS-CoV-2 by FDA under an Emergency Use Authorization (EUA). This EUA will remain  in effect (meaning this test can  be used) for the duration of the COVID-19 declaration under Section 564(b)(1) of the Act, 21 U.S.C.section 360bbb-3(b)(1), unless the authorization is terminated  or revoked sooner.       Influenza A by PCR NEGATIVE NEGATIVE Final   Influenza B by PCR NEGATIVE NEGATIVE Final    Comment: (NOTE) The Xpert Xpress SARS-CoV-2/FLU/RSV plus assay is intended as an aid in the diagnosis of influenza from Nasopharyngeal swab specimens and should not be used as a sole basis for treatment. Nasal washings and aspirates are unacceptable for Xpert Xpress SARS-CoV-2/FLU/RSV testing.  Fact Sheet for Patients: EntrepreneurPulse.com.au  Fact Sheet for Healthcare Providers: IncredibleEmployment.be  This test is not yet approved or cleared by the [place] FDA and has been authorized for detection and/or diagnosis of SARS-CoV-2 by FDA under an Emergency Use Authorization (EUA). This EUA will remain in effect (meaning this test can be used) for the duration of the COVID-19 declaration under Section 564(b)(1) of the Act, 21 U.S.C. section 360bbb-3(b)(1), unless the authorization is terminated or revoked.  Performed at [facility], [address] [place], [place] [address]       Imaging Studies   No results found.   Medications   Scheduled Meds: . acetaminophen  1,000 mg Oral Q8H  . vitamin C  500 mg Oral Daily  . citalopram  20 mg Oral Daily  . dronabinol  5 mg Oral BID AC  . enoxaparin (LOVENOX) injection  40 mg Subcutaneous Q24H  . iron polysaccharides  150 mg Oral Daily  . lidocaine  1 patch Transdermal Q24H  . midodrine  2.5 mg Oral TID WC  . oxyCODONE  10 mg Oral BID  . pantoprazole  40 mg Oral BID  . polyethylene glycol  17 g Oral BID  . Relugolix  120 mg Oral Daily  . rosuvastatin  5 mg Oral QHS  . senna-docusate  1 tablet Oral QHS   Continuous Infusions:     LOS: 7 days    Time spent: 25 minutes with >25 minutes  spent at bedside and coronation of care.    Ezekiel Slocumb, DO [facility]  02/16/2021, 4:33 PM      If 7PM-7AM, please contact night-coverage. How to contact the [facility] Attending or Consulting provider [place] or covering provider during after hours [place], for this patient?    1. Check the care team in [facility] and look for a) attending/consulting TRH provider listed and b) the [facility] team listed 2. Log into [email or web address] and use [place]'s universal password to access. If you do not have the password, please contact the hospital  operator. 3. Locate the [facility] provider you are looking for under [facility] and page to a number that you can be directly reached. 4. If you still have difficulty reaching the provider, please page the [facility] (Director on Call) for the Hospitalists listed on amion for assistance.

## 2021-02-17 ENCOUNTER — Inpatient Hospital Stay: Payer: Medicare Other

## 2021-02-17 DIAGNOSIS — M6281 Muscle weakness (generalized): Secondary | ICD-10-CM | POA: Diagnosis not present

## 2021-02-17 DIAGNOSIS — S2241XD Multiple fractures of ribs, right side, subsequent encounter for fracture with routine healing: Secondary | ICD-10-CM

## 2021-02-17 DIAGNOSIS — I951 Orthostatic hypotension: Secondary | ICD-10-CM

## 2021-02-17 DIAGNOSIS — C61 Malignant neoplasm of prostate: Secondary | ICD-10-CM | POA: Diagnosis not present

## 2021-02-17 LAB — BASIC METABOLIC PANEL
Anion gap: 4 — ABNORMAL LOW (ref 5–15)
BUN: 32 mg/dL — ABNORMAL HIGH (ref 8–23)
CO2: 23 mmol/L (ref 22–32)
Calcium: 8 mg/dL — ABNORMAL LOW (ref 8.9–10.3)
Chloride: 113 mmol/L — ABNORMAL HIGH (ref 98–111)
Creatinine, Ser: 1.22 mg/dL (ref 0.61–1.24)
GFR, Estimated: >60 mL/min (ref >60)
Glucose, Bld: 89 mg/dL (ref 70–99)
Potassium: 5.3 mmol/L — ABNORMAL HIGH (ref 3.5–5.1)
Sodium: 140 mmol/L (ref 135–145)

## 2021-02-17 LAB — MAGNESIUM: Magnesium: 1.9 mg/dL (ref 1.7–2.4)

## 2021-02-17 MED ORDER — POLYETHYLENE GLYCOL 3350 17 G PO PACK: 17.0000 g | PACK | Freq: Two times a day (BID) | ORAL | 0 refills | Status: AC

## 2021-02-17 MED ORDER — OXYCODONE HCL ER 10 MG PO T12A: 10.0000 mg | EXTENDED_RELEASE_TABLET | Freq: Two times a day (BID) | ORAL | 0 refills | Status: AC

## 2021-02-17 MED ORDER — OXYCODONE HCL 5 MG PO TABS: 5.0000 mg | ORAL_TABLET | ORAL | 0 refills | Status: AC | PRN

## 2021-02-17 MED ORDER — SENNOSIDES-DOCUSATE SODIUM 8.6-50 MG PO TABS: 1.0000 | ORAL_TABLET | Freq: Every day | ORAL | Status: AC

## 2021-02-17 MED ORDER — MECLIZINE HCL 25 MG PO TABS: 25.0000 mg | ORAL_TABLET | Freq: Three times a day (TID) | ORAL | 0 refills | Status: AC | PRN

## 2021-02-17 MED ORDER — POLYSACCHARIDE IRON COMPLEX 150 MG PO CAPS: 150.0000 mg | ORAL_CAPSULE | Freq: Every day | ORAL | Status: AC

## 2021-02-17 MED ORDER — LIDOCAINE 5 % EX PTCH: 1.0000 | MEDICATED_PATCH | CUTANEOUS | 0 refills | Status: AC

## 2021-02-17 MED ORDER — ACETAMINOPHEN 500 MG PO TABS: 1000.0000 mg | ORAL_TABLET | Freq: Three times a day (TID) | ORAL | 0 refills | Status: AC

## 2021-02-17 MED ORDER — BISACODYL 10 MG RE SUPP: 10.0000 mg | Freq: Every day | RECTAL | 0 refills | Status: AC | PRN

## 2021-02-17 MED ORDER — ASCORBIC ACID 500 MG PO TABS: 500.0000 mg | ORAL_TABLET | Freq: Every day | ORAL | Status: AC

## 2021-02-17 MED ORDER — MIDODRINE HCL 5 MG PO TABS
5.0000 mg | ORAL_TABLET | Freq: Three times a day (TID) | ORAL | Status: DC
  Administered 2021-02-17 (×2): 5 mg via ORAL
  Filled 2021-02-17 (×2): qty 1

## 2021-02-17 MED ORDER — DRONABINOL 5 MG PO CAPS
5.0000 mg | ORAL_CAPSULE | Freq: Two times a day (BID) | ORAL | Status: AC
Start: 2021-02-17 — End: ?

## 2021-02-17 MED ORDER — MIDODRINE HCL 5 MG PO TABS: 5.0000 mg | ORAL_TABLET | Freq: Three times a day (TID) | ORAL | Status: AC

## 2021-02-17 MED ORDER — FUROSEMIDE 40 MG PO TABS
40.0000 mg | ORAL_TABLET | Freq: Every day | ORAL | Status: DC
Start: 2021-02-17 — End: 2021-02-17
  Administered 2021-02-17: 40 mg via ORAL
  Filled 2021-02-17: qty 1

## 2021-02-17 MED ORDER — BISACODYL 5 MG PO TBEC: 10.0000 mg | DELAYED_RELEASE_TABLET | Freq: Every day | ORAL | 0 refills | Status: AC | PRN

## 2021-02-17 NOTE — TOC Transition Note (Signed)
Transition of Care [facility]) - CM/SW Discharge Note   Patient Details  Name: Brinton Brandel MRN: [id number] Date of Birth: 1946-07-08  Transition of Care [facility]) CM/SW Contact:  Beverly Sessions, RN Phone Number: 02/17/2021, 3:25 PM   Clinical Narrative:    Patient to discharge to [facility] today Repeat covid negative DC info sent in the [place]  Bedside RN to call report.  EMS transport called Signed DNR and EMS packet on chart    Final next level of care: Skilled Nursing Facility Barriers to Discharge: No Barriers Identified   Patient Goals and CMS Choice   CMS Medicare.gov Compare Post Acute Care list provided to:: Patient Choice offered to / list presented to : [facility]  Discharge Placement              Patient [facility] bed at: [facility] [place] Patient to be transferred to facility by: EMS Name of family member notified: wife    Discharge Plan and Services In-house Referral: NA   Post Acute Care Choice: [place]                  Representative spoke with at DME Agency: n/a         Representative spoke with at [place]: n/a  Social Determinants of Health ([place]) Interventions     Readmission Risk Interventions No flowsheet data found.

## 2021-02-17 NOTE — Discharge Summary (Addendum)
Physician Discharge Summary  Anthony Figueroa SNK:[id number] DOB: May 14, 1946 DOA: 02/08/2021  PCP: Venita Lick, NP  Admit date: 02/08/2021 Discharge date: 02/17/2021  Admitted From: home Disposition:  SNF  Recommendations for Outpatient Follow-up:  1. Follow up with PCP in 1-2 weeks 2. Please obtain BMP/CBC in one week 3. Please follow up with oncology as previously scheduled 4. Palliative care to follow patient at [facility]: No  Equipment/Devices: None   Discharge Condition: Stable  CODE STATUS: DNR  Diet recommendation: Heart Healthy     Discharge Diagnoses: Active Problems:   Essential hypertension   Hx of CABG   Paroxysmal atrial fibrillation (HCC)   Prostate cancer metastatic to bone [facility])   PVD (peripheral vascular disease) (HCC)   Fall   Pneumothorax, right   Ribs, multiple fractures   Generalized muscle weakness   Dizziness   Orthostatic hypotension    Summary of HPI and Hospital Course:  Anthony Figueroa a 75 y.o.malewith medical history significant forcoronary artery disease status post CABG, essential hypertension, hyperlipidemia, paroxysmal A. fibnot on oralanticoagulation, peptic ulcer disease,recently diagnosedprostate cancer with metastases to the bones (January 2022)on oral chemotherapy since6 weeks ago, history of aortic valve replacement,arrhythmia with implantable loop recorder currently present,who presented to [facility] ED after a fall at [facility] morning 7 AM when he went to the bathroom and was standing and felt dizzy and fell backwards.   Work-up in the ED revealed right-sided7th and 8th ribsfractures and right-sided trace apical pneumothorax. O2 saturation 94% on room air. BP 131/45, heart rate 55, otherwise normal vitals. Lab studies remarkable for elevated creatinine 1.56. Admitted to hospitalist service.    Fallsecondary to dizziness-  CT head, lumbar spine, pelvis without contrast negative for any acute findings.  However showed osseous metastatic disease. Chronic appearing Schmorl's node/superior endplate fracture at L3.  Denies loss of consciousness, but endorsed feeling dizzy prior to falling -orthostatic Vitals were + --Stopped trazodone as that is prone to cause orthostasis --Continue midodrine 5 mg TID  - orthostatic symptoms have improved --Stopped antihypertensive medications --Fall precautions --PT/OT -discharging to SNF for ongoing rehab --Can try PRN meclizine if dizziness persistent despite above  Anorexia / Inadequate PO intake - discussed with patient use of Marinol for appetite stimulation.  He was agreeable to try. --Marinol BID AC started - seems well tolerated, continue  Generalized weakness / debility - due to metastatic disease and other chronic comorbidities, as well as poor PO intake. --SNF for rehab on d/c  Right Leg/Hip Pain - reported today 3/25.  Has anterior R thigh/hip pain trying to lift leg off the bed.  No pain with passive ROM.  Xrays of Pelvis and Right Femur negative for any acute bony abnormalities, no fracture or dislocation.  Has diffuse bony mets throughout the pelvis that can be painful.  In addition, may have some soft tissue strain/contusion from his fall. --Continue PT at SNF --Pain control  Right-sided7th and 8thribs fracture- due to fall above Right-sided apical trace pneumothorax- resolved 3/18 CXRPreviously noted pneumothorax is no longer confidently identified. --Repeat chest x-ray if patient develops worsening shortness of breath or chest pain --O2 if needed to maintain sats >90%. --Pain control: scheduled Tylenol, lidocaine patches, OxyContin 10 mg BID, PRN oxycodone IR --Pt has Rx for Xtampza he had not yet picked up or started taking.  Since pain controlled with current regimen, will continue that at rehab. --Bowel regimen --Incentive spirometry --OT evaluation - SNF recommended  Bilateral lower extremity edema- Left >Right.  Chronic left lower extremity swelling secondary to vein graft for CABG. Lower extremity doppler U/S negative for DVT's bilaterally. Mild hypoxia more likely due to shallow inspirations due to pain from rib fractures.  Monitor.  Acute respiratory failure with hypoxia - due to rib fractures, shallow inspirations. --Use oxygen as needed to keep O2 sat > 90%  AKI onCKD stage 3b- AKI resolved with IV hydration. AKI POA with Cr 1.56 with increased up to 1.75, up from apparent baseline ~1.44.  AKI likely prerenal due to poor p.o. intake.   Renal ultrasound was unremarkable. --Stoppedlosartan ->could be contributor for orthostasisas well --resumed on home Lasix (for edema)  Prostate cancer metastasis to the bones- Follows with hematology oncology, Dr. Tessie Eke at [facility]. Pt's wife stated to RN that they were told to stopchemotherapyXtandiwhich was d/c;d on 3/18 and continuedorgovyx 120 mg daily. --Continue orgovyx  --Palliative care consulted --Palliative to follow patient after discharge  Coronary disease status post CABG- stable, no active ischemic chest pain.  --Continue home regimen: aspirin, losartan, rosuvastatin and oral LasixPRN.  GERD- continue PPI      Body mass index is 25.12 kg/m.   Discharge Instructions   Discharge Instructions    Call MD for:  extreme fatigue   Complete by: As directed    Call MD for:  persistant dizziness or light-headedness   Complete by: As directed    Call MD for:  persistant nausea and vomiting   Complete by: As directed    Call MD for:  severe uncontrolled pain   Complete by: As directed    Call MD for:  temperature >100.4   Complete by: As directed    Diet - low sodium heart healthy   Complete by: As directed    Discharge wound care:   Complete by: As directed    Daily - apply double-folded xeroform gauze to right arm wounds daily then cover with ABD pad and kerlex   Increase activity slowly   Complete by: As  directed      Allergies as of 02/17/2021      Reactions   Metformin Swelling   Mirtazapine Anxiety   Memory confusion Memory confusion Memory confusion Memory confusion   Indocin [indomethacin]    Sitagliptin-metformin Hcl Nausea And Vomiting      Medication List    STOP taking these medications   amLODipine 2.5 MG tablet Commonly known as: NORVASC   enzalutamide 80 MG tablet Commonly known as: XTANDI   losartan 100 MG tablet Commonly known as: COZAAR   methylphenidate 27 MG CR tablet Commonly known as: CONCERTA   omeprazole 40 MG capsule Commonly known as: PRILOSEC   traZODone 50 MG tablet Commonly known as: DESYREL   Xtampza ER 13.5 MG C12a Generic drug: oxyCODONE ER     TAKE these medications   acetaminophen 500 MG tablet Commonly known as: TYLENOL Take 2 tablets (1,000 mg total) by mouth every 8 (eight) hours. What changed:   how much to take  when to take this   ascorbic acid 500 MG tablet Commonly known as: VITAMIN C Take 1 tablet (500 mg total) by mouth daily. Start taking on: February 18, 2021   aspirin 81 MG EC tablet Take by mouth.   bisacodyl 5 MG EC tablet Commonly known as: DULCOLAX Take 2 tablets (10 mg total) by mouth daily as needed for moderate constipation.   bisacodyl 10 MG suppository Commonly known as: DULCOLAX Place 1 suppository (10 mg total) rectally daily as needed for  moderate constipation.   citalopram 20 MG tablet Commonly known as: CELEXA Take by mouth.   docusate sodium 100 MG capsule Commonly known as: COLACE Take 100 mg by mouth 2 (two) times daily.   dronabinol 5 MG capsule Commonly known as: MARINOL Take 1 capsule (5 mg total) by mouth 2 (two) times daily before lunch and supper.   ergocalciferol 1.25 MG (50000 UT) capsule Commonly known as: VITAMIN D2 Take by mouth.   furosemide 40 MG tablet Commonly known as: LASIX Take by mouth.   iron polysaccharides 150 MG capsule Commonly known as:  NIFEREX Take 1 capsule (150 mg total) by mouth daily. Start taking on: February 18, 2021   lidocaine 5 % Commonly known as: LIDODERM Place 1 patch onto the skin daily. Remove & Discard patch within 12 hours or as directed by MD Start taking on: February 18, 2021   meclizine 25 MG tablet Commonly known as: ANTIVERT Take 1 tablet (25 mg total) by mouth 3 (three) times daily as needed for dizziness.   midodrine 5 MG tablet Commonly known as: PROAMATINE Take 1 tablet (5 mg total) by mouth 3 (three) times daily with meals.   oxyCODONE 5 MG immediate release tablet Commonly known as: Oxy IR/ROXICODONE Take 1 tablet (5 mg total) by mouth every 4 (four) hours as needed for up to 7 days for breakthrough pain or moderate pain. What changed:   how much to take  when to take this  reasons to take this   oxyCODONE 10 mg 12 hr tablet Commonly known as: OXYCONTIN Take 1 tablet (10 mg total) by mouth 2 (two) times daily for 7 days. What changed: You were already taking a medication with the same name, and this prescription was added. Make sure you understand how and when to take each.   pantoprazole 40 MG tablet Commonly known as: PROTONIX Take 40 mg by mouth 2 (two) times daily.   polyethylene glycol 17 g packet Commonly known as: MIRALAX / GLYCOLAX Take 17 g by mouth 2 (two) times daily. Hold if having loose or frequent stools.   Relugolix 120 MG Tabs Take by mouth.   rosuvastatin 5 MG tablet Commonly known as: CRESTOR Take 5 mg by mouth at bedtime.   senna-docusate 8.6-50 MG tablet Commonly known as: Senokot-S Take 1 tablet by mouth at bedtime.            Discharge Care Instructions  (From admission, onward)         Start     Ordered   02/17/21 0000  Discharge wound care:       Comments: Daily - apply double-folded xeroform gauze to right arm wounds daily then cover with ABD pad and kerlex   02/17/21 1328          Contact information for after-discharge care     [facility] .   Service: Skilled Nursing Contact information: [place] [phone]                 Allergies  Allergen Reactions  . Metformin Swelling  . Mirtazapine Anxiety    Memory confusion Memory confusion Memory confusion Memory confusion   . Indocin [Indomethacin]   . Sitagliptin-Metformin Hcl Nausea And Vomiting     If you experience worsening of your admission symptoms, develop shortness of breath, life threatening emergency, suicidal or homicidal thoughts you must seek medical attention immediately by calling 911 or calling your  MD immediately  if symptoms less severe.    Please note   You were cared for by a hospitalist during your hospital stay. If you have any questions about your discharge medications or the care you received while you were in the hospital after you are discharged, you can call the unit and asked to speak with the hospitalist on call if the hospitalist that took care of you is not available. Once you are discharged, your primary care physician will handle any further medical issues. Please note that NO REFILLS for any discharge medications will be authorized once you are discharged, as it is imperative that you return to your primary care physician (or establish a relationship with a primary care physician if you do not have one) for your aftercare needs so that they can reassess your need for medications and monitor your lab values.   Consultations:  Palliative Care    Procedures/Studies: DG Chest 2 View  Result Date: 02/08/2021 CLINICAL DATA:  Pain following fall.  History of prostate carcinoma EXAM: CHEST - 2 VIEW COMPARISON:  None. FINDINGS: There is interstitial thickening bilaterally. No edema or consolidation evident. Heart size and pulmonary vascular normal. Patient is status post aortic valve replacement and coronary artery bypass grafting. There is  aortic atherosclerosis. There is a loop recorder on the left. Trace right apical pneumothorax fractures of the anterolateral right seventh and anterior right eighth ribs noted. Note mixed sclerotic lesions superimposed on osteoporosis noted, consistent with prostate carcinoma. No pleural effusions. IMPRESSION: Rib fractures on the right with trace right apical pneumothorax. Bony metastatic disease superimposed on osteoporosis. Interstitial thickening is likely due to underlying chronic bronchitis. No edema or airspace opacity. Heart size normal. Status post coronary artery bypass grafting and aortic valve replacement. Aortic Atherosclerosis (ICD10-I70.0). Critical Value/emergent results were called by telephone at the time of interpretation on 02/08/2021 at 9:21 am to provider [facility] , who verbally acknowledged these results. Electronically Signed   By: Lowella Grip III M.D.   On: 02/08/2021 09:22   DG Lumbar Spine Complete  Addendum Date: 02/08/2021   ADDENDUM REPORT: 02/08/2021 09:19 ADDENDUM: Note that there is extensive sclerotic bony metastatic disease consistent with known prostate carcinoma. Electronically Signed   By: Lowella Grip III M.D.   On: 02/08/2021 09:19   Result Date: 02/08/2021 CLINICAL DATA:  Pain following fall EXAM: LUMBAR SPINE - COMPLETE 4+ VIEW COMPARISON:  None. FINDINGS: Frontal, lateral, spot lumbosacral lateral, and bilateral oblique views were obtained. There are 5 non-rib-bearing lumbar type vertebral bodies. There is no appreciable fracture or spondylolisthesis. There is disc space narrowing at L5-S1. Other disc spaces appear unremarkable. There is facet osteoarthritic change at L5-S1 bilaterally. Facets at other levels appear unremarkable. There are foci of aortic atherosclerosis. IMPRESSION: Osteoarthritic change at L5-S1. No fracture or spondylolisthesis. Aortic Atherosclerosis (ICD10-I70.0). Electronically Signed: By: Lowella Grip III M.D. On: 02/08/2021  09:15   DG Pelvis 1-2 Views  Result Date: 02/17/2021 CLINICAL DATA:  [place]. Right leg pain. History of metastatic prostate cancer. EXAM: PELVIS - 1-2 VIEW COMPARISON:  CT scan 02/08/2021 FINDINGS: Both hips are normally located. Diffuse sclerotic metastatic bone disease. No acute hip fracture is identified. No pelvic fractures. The pubic symphysis and SI joints are intact. IMPRESSION: No acute bony findings. Electronically Signed   By: Marijo Sanes M.D.   On: 02/17/2021 13:07   CT Head Wo Contrast  Result Date: 02/08/2021 CLINICAL DATA:  Pain following fall. EXAM: CT HEAD WITHOUT CONTRAST TECHNIQUE: Contiguous  axial images were obtained from the base of the skull through the vertex without intravenous contrast. COMPARISON:  None. FINDINGS: Brain: There is mild diffuse atrophy. There is no intracranial mass, hemorrhage, extra-axial fluid collection or midline shift. There is slight decreased attenuation in the centra semiovale bilaterally. No evident acute infarct. Vascular: No hyperdense vessel. There is slight carotid siphon arterial vascular calcification bilaterally. Skull: Bony calvarium appears intact. Sinuses/Orbits: There is opacification in several ethmoid air cells. There is a retention cyst in the lateral right sphenoid sinus posteriorly. Visualized orbits appear symmetric bilaterally. Other: Opacification noted in several mastoid air cells on the right. Visualized mastoids on the left are clear. IMPRESSION: Mild atrophy with slight periventricular small vessel disease. No mass or hemorrhage. No extra-axial fluid. No acute infarct. Areas of opacification in several mastoid air cells on the right. Foci of paranasal sinus disease noted. Slight arterial vascular calcification. Electronically Signed   By: Lowella Grip III M.D.   On: 02/08/2021 09:24   CT CHEST WO CONTRAST  Result Date: 02/08/2021 CLINICAL DATA:  Chest pain.  Fall.  History of prostate carcinoma EXAM: CT CHEST WITHOUT CONTRAST  TECHNIQUE: Multidetector CT imaging of the chest was performed following the standard protocol without IV contrast. COMPARISON:  Chest radiograph February 08, 2021 FINDINGS: Cardiovascular: There is no thoracic aortic aneurysm. There is calcification in multiple visualized great vessels. There is calcification in the thoracic aorta. There is an aortic valve replacement. There are multiple foci of coronary artery calcification. No pericardial effusion or pericardial thickening evident. Mediastinum/Nodes: Thyroid appears normal. Several subcentimeter mediastinal lymph nodes evident. No adenopathy by size criteria appreciable. There is a small hiatal hernia. Lungs/Pleura: There is a small pneumothorax on the right seen apically and anteriorly without tension component. There is a fairly small pleural effusion on the right with consolidation in each lung base, more on the right than on the left. There is a degree of underlying centrilobular emphysematous change. Trachea and major bronchial structures appear patent. Upper Abdomen: There is upper abdominal aortic atherosclerosis. Gallbladder is absent. There is a 4 x 2 mm calculus in the mid right kidney. Visualized upper abdominal structures otherwise appear normal. Musculoskeletal: There is widespread sclerotic bony metastatic disease throughout the thorax. There is a comminuted fracture of the posterior right ninth rib. There are displaced fractures of the right posterior sixth, seventh, and eighth ribs as well. Status post median sternotomy. No chest wall lesions. IMPRESSION: 1. Small pneumothorax confirmed on the right without tension component. 2. Several displaced rib fractures on the right posteriorly and posterolaterally. 3. Widespread sclerotic bony metastatic disease consistent with known prostate carcinoma. 4. Small right pleural effusion. Ill-defined atelectasis and consolidation in the posterior lung bases. Underlying centrilobular emphysema. 5. Extensive  aortic atherosclerosis. Foci of coronary artery calcification noted. Status post aortic valve replacement. 6.  Small hiatal hernia. 7.  Gallbladder absent. 8.  Nonobstructing 4 x 2 mm calculus mid right kidney. Aortic Atherosclerosis (ICD10-I70.0) and Emphysema (ICD10-J43.9). Electronically Signed   By: Lowella Grip III M.D.   On: 02/08/2021 13:47   CT Lumbar Spine Wo Contrast  Result Date: 02/08/2021 CLINICAL DATA:  Low back pain. Suspected pathologic fracture after fall. EXAM: CT LUMBAR SPINE WITHOUT CONTRAST TECHNIQUE: Multidetector CT imaging of the lumbar spine was performed without intravenous contrast administration. Multiplanar CT image reconstructions were also generated. COMPARISON:  None. FINDINGS: Segmentation: 5 lumbar type vertebrae based on the available coverage Alignment: Normal. Vertebrae: Innumerable sclerotic bony metastases throughout each visualized bone. L3  superior endplate fracture/Schmorl's node with chronic appearance. No acute fracture is seen. No visible extraosseous tumor growth Paraspinal and other soft tissues: Atheromatous calcification of the aorta and branch vessels. Subcutaneous reticulation in the back without hematoma or soft tissue gas. Disc levels: Focal L5-S1 disc narrowing and endplate degeneration. Mild lower lumbar facet spurring. Small scattered endplate spurs. No compressive stenosis is seen. IMPRESSION: 1. No acute finding. 2. Widespread osseous metastatic disease. 3. Chronic appearing Schmorl's node/superior endplate fracture at L3 Electronically Signed   By: Monte Fantasia M.D.   On: 02/08/2021 11:16   CT PELVIS WO CONTRAST  Result Date: 02/08/2021 CLINICAL DATA:  Pelvic trauma EXAM: CT PELVIS WITHOUT CONTRAST TECHNIQUE: Multidetector CT imaging of the pelvis was performed following the standard protocol without intravenous contrast. COMPARISON:  None. FINDINGS: Urinary Tract:  Negative Bowel:  Extensive sigmoid diverticulosis.  Moderate stool retention  Vascular/Lymphatic: Diffuse atheromatous calcification of the aorta and iliacs. Reproductive:  History of prostate cancer.  No gross asymmetry. Other:  No visible ascites or pneumoperitoneum Musculoskeletal: Innumerable sclerotic bone metastases. No acute fracture. The hips are both located. Lumbar spine findings described separately. No visible extraosseous tumor growth. IMPRESSION: 1. No acute finding. 2. Extensive osseous metastatic disease without pathologic fracture or visible extraosseous tumor growth. Electronically Signed   By: Monte Fantasia M.D.   On: 02/08/2021 11:14   US RENAL  Result Date: 02/10/2021 CLINICAL DATA:  Acute renal insufficiency. History of prostate cancer. EXAM: RENAL / URINARY TRACT ULTRASOUND COMPLETE COMPARISON:  CT pelvis 02/08/2021. FINDINGS: Right Kidney: Renal measurements: 9.0 x 4.8 x 4.8 cm = volume: 108 mL. Echogenicity is within normal limits. No mass or hydronephrosis. An 8 mm shadowing echogenic stone is seen. Left Kidney: Renal measurements: 7.5 x 4.6 x 3.6 cm = volume: 65 mL. Echogenicity within normal limits. No mass or hydronephrosis visualized. There may be cortical scarring. Bladder: Contour abnormality of the right bladder is likely transient given normal appearance on 02/08/2021. Ureteral jets are identified. Prevoid volume of 132 cc. Patient was unable to void. Other: None. IMPRESSION: No acute findings. Electronically Signed   By: Lorin Picket M.D.   On: 02/10/2021 12:12   US Venous Img Lower Bilateral (DVT)  Result Date: 02/08/2021 CLINICAL DATA:  Bilateral lower extremity edema. EXAM: Bilateral LOWER EXTREMITY VENOUS DOPPLER ULTRASOUND TECHNIQUE: Gray-scale sonography with compression, as well as color and duplex ultrasound, were performed to evaluate the deep venous system(s) from the level of the common femoral vein through the popliteal and proximal calf veins. COMPARISON:  None. FINDINGS: VENOUS Normal compressibility of the common femoral,  superficial femoral, and popliteal veins, as well as the visualized calf veins. Visualized portions of profunda femoral vein and great saphenous vein unremarkable. No filling defects to suggest DVT on grayscale or color Doppler imaging. Doppler waveforms show normal direction of venous flow, normal respiratory plasticity and response to augmentation. Limited views of the contralateral common femoral vein are unremarkable. OTHER None. Limitations: none IMPRESSION: No evidence of deep venous thrombosis in LEFT or RIGHT lower extremity. Electronically Signed   By: Suzy Bouchard M.D.   On: 02/08/2021 16:23   DG Chest Port 1 View  Result Date: 02/10/2021 CLINICAL DATA:  75 year old male with history of right-sided pneumothorax. Follow-up study. EXAM: PORTABLE CHEST 1 VIEW COMPARISON:  Chest x-ray 02/09/2021. FINDINGS: Lung volumes are low. No definite residual pneumothorax confidently identified. Bibasilar opacities (right greater than left), which may reflect areas of atelectasis and/or consolidation. No definite pleural effusions. No evidence of  pulmonary edema. Heart size is normal. Upper mediastinal contours are within normal limits. Atherosclerotic calcifications in the thoracic aorta. Status post median sternotomy. Electronic device projecting over the heart, likely an implantable pacemaker device. Innumerable sclerotic lesions noted throughout the visualized skeleton indicative of widespread metastatic disease. IMPRESSION: 1. Previously noted pneumothorax is no longer confidently identified. 2. Low lung volumes with bibasilar (right greater than left) areas of atelectasis and/or consolidation. 3. Aortic atherosclerosis. 4. Diffuse skeletal metastatic disease. Electronically Signed   By: Vinnie Langton M.D.   On: 02/10/2021 11:29   DG Chest Port 1 View  Result Date: 02/09/2021 CLINICAL DATA:  Chest pain and shortness of breath. Fall. History of prostate carcinoma EXAM: PORTABLE CHEST 1 VIEW COMPARISON:   Chest radiograph and chest CT February 08, 2021 FINDINGS: Small right apical pneumothorax is stable. Several rib fractures noted on the right. There is mild right base atelectasis. No edema or airspace opacity. Patient is status post coronary artery bypass grafting and aortic valve replacement. Heart size and pulmonary vascularity are normal. Loop recorder on the left. There is aortic atherosclerosis. No appreciable adenopathy. Widespread sclerotic bony metastases noted. IMPRESSION: Stable right apical pneumothorax. Several rib fractures on the right. Underlying sclerotic bony metastases. Mild right base atelectasis. No edema or airspace opacity. Stable cardiac silhouette with postoperative changes. Aortic Atherosclerosis (ICD10-I70.0). Electronically Signed   By: Lowella Grip III M.D.   On: 02/09/2021 08:42   ECHOCARDIOGRAM COMPLETE  Result Date: 02/10/2021    ECHOCARDIOGRAM REPORT   Patient Name:   MARCY BOGOSIAN Date of Exam: 02/09/2021 Medical Rec #:  [id number]    Height:       71.0 in Accession #:    [id number]   Weight:       180.0 lb Date of Birth:  [birth year]     BSA:          2.016 m Patient Age:    64 years     BP:           146/63 mmHg Patient Gender: M            HR:           74 bpm. Exam Location:  [facility] Procedure: 2D Echo, Cardiac Doppler and Color Doppler Indications:     Syncope R55  History:         Patient has no prior history of Echocardiogram examinations.                  Prior CABG; COPD and Stroke. AAA, PFO.  Sonographer:     Sherrie Sport RDCS (AE) Referring Phys:  [id number] Val Riles Diagnosing Phys: Serafina Royals MD  Sonographer Comments: Technically challenging study due to limited acoustic windows, no parasternal window and no subcostal window. IMPRESSIONS  1. Left ventricular ejection fraction, by estimation, is 50 to 55%. The left ventricle has low normal function. The left ventricle has no regional wall motion abnormalities. Left ventricular diastolic function could not be evaluated.   2. Right ventricular systolic function is normal. The right ventricular size is normal.  3. Left atrial size was mildly dilated.  4. The mitral valve is normal in structure. Mild mitral valve regurgitation.  5. The aortic valve is normal in structure. Aortic valve regurgitation is trivial. FINDINGS  Left Ventricle: Left ventricular ejection fraction, by estimation, is 50 to 55%. The left ventricle has low normal function. The left ventricle has no regional wall motion abnormalities. The left ventricular internal cavity size was small.  There is no left ventricular hypertrophy. Left ventricular diastolic function could not be evaluated. Right Ventricle: The right ventricular size is normal. No increase in right ventricular wall thickness. Right ventricular systolic function is normal. Left Atrium: Left atrial size was mildly dilated. Right Atrium: Right atrial size was normal in size. Pericardium: There is no evidence of pericardial effusion. Mitral Valve: The mitral valve is normal in structure. Mild mitral valve regurgitation. Tricuspid Valve: The tricuspid valve is normal in structure. Tricuspid valve regurgitation is mild. Aortic Valve: The aortic valve is normal in structure. Aortic valve regurgitation is trivial. Aortic valve mean gradient measures 3.0 mmHg. Aortic valve peak gradient measures 4.8 mmHg. Aortic valve area, by VTI measures 1.92 cm. Pulmonic Valve: The pulmonic valve was normal in structure. Pulmonic valve regurgitation is not visualized. Aorta: The aortic root and ascending aorta are structurally normal, with no evidence of dilitation. IAS/Shunts: No atrial level shunt detected by color flow Doppler.  LEFT VENTRICLE PLAX 2D LVIDd:         3.21 cm  Diastology LVIDs:         2.33 cm  LV e' medial:    3.15 cm/s LV PW:         1.34 cm  LV E/e' medial:  15.7 LV IVS:        1.00 cm  LV e' lateral:   6.42 cm/s LVOT diam:     2.00 cm  LV E/e' lateral: 7.7 LV SV:         41 LV SV Index:   20 LVOT Area:      3.14 cm  RIGHT VENTRICLE RV Basal diam:  3.02 cm RV S prime:     9.46 cm/s TAPSE (M-mode): 2.7 cm LEFT ATRIUM             Index       RIGHT ATRIUM           Index LA diam:        4.40 cm 2.18 cm/m  RA Area:     17.30 cm LA Vol (A2C):   52.6 ml 26.09 ml/m RA Volume:   46.40 ml  23.01 ml/m LA Vol (A4C):   43.0 ml 21.33 ml/m LA Biplane Vol: 47.3 ml 23.46 ml/m  AORTIC VALVE AV Area (Vmax):    2.11 cm AV Area (Vmean):   2.13 cm AV Area (VTI):     1.92 cm AV Vmax:           109.00 cm/s AV Vmean:          76.300 cm/s AV VTI:            0.211 m AV Peak Grad:      4.8 mmHg AV Mean Grad:      3.0 mmHg LVOT Vmax:         73.10 cm/s LVOT Vmean:        51.800 cm/s LVOT VTI:          0.129 m LVOT/AV VTI ratio: 0.61  AORTA Ao Root diam: 3.33 cm MITRAL VALVE               TRICUSPID VALVE MV Area (PHT): 2.50 cm    TR Peak grad:   10.2 mmHg MV Decel Time: 304 msec    TR Vmax:        160.00 cm/s MV E velocity: 49.30 cm/s MV A velocity: 84.40 cm/s  SHUNTS MV E/A ratio:  0.58  Systemic VTI:  0.13 m                            Systemic Diam: 2.00 cm Serafina Royals MD Electronically signed by Serafina Royals MD Signature Date/Time: 02/10/2021/4:47:57 PM    Final    DG FEMUR PORT, MIN 2 VIEWS RIGHT  Result Date: 02/17/2021 CLINICAL DATA:  [place].  Right femur pain. EXAM: RIGHT FEMUR PORTABLE 2 VIEW COMPARISON:  CT scan 02/08/2021 FINDINGS: Diffuse sclerotic osseous metastatic disease. No acute femur fracture is identified. IMPRESSION: 1. Diffuse sclerotic osseous metastatic disease. 2. No acute femur fracture. Electronically Signed   By: Marijo Sanes M.D.   On: 02/17/2021 13:08       Subjective: Pt seen this AM.  Reports overall feels better.  Is having R anterior thigh and hip pain trying to use his R leg, unable to raise it off the bed.  Expresses concern for fracture from his fall.  Xrays obtained and negative.     Discharge Exam: Vitals:   02/17/21 1408 02/17/21 1501  BP: (!) 117/49 133/63  Pulse: 63 (!)  57  Resp: 18 16  Temp: 98 F (36.7 C) 97.8 F (36.6 C)  SpO2: 96% 100%   Vitals:   02/17/21 0940 02/17/21 1123 02/17/21 1408 02/17/21 1501  BP: (!) 122/57 (!) 134/58 (!) 117/49 133/63  Pulse: 61 61 63 (!) 57  Resp:   18 16  Temp: 97.9 F (36.6 C) 97.9 F (36.6 C) 98 F (36.7 C) 97.8 F (36.6 C)  TempSrc: Oral Oral Oral Oral  SpO2: 98% 98% 96% 100%  Weight:      Height:        General: Pt is alert, awake, not in acute distress Cardiovascular: RRR, S1/S2 +, no rubs, no gallops Respiratory: CTA bilaterally, slightly increased inhalations, no wheezing, no rhonchi Abdominal: Soft, NT, ND, bowel sounds + Extremities: L>R lower extremity edema stable, no cyanosis    The results of significant diagnostics from this hospitalization (including imaging, microbiology, ancillary and laboratory) are listed below for reference.     Microbiology: Recent Results (from the past 240 hour(s))  SARS CORONAVIRUS 2 (TAT 6-24 HRS) Nasopharyngeal Nasopharyngeal Swab     Status: None   Collection Time: 02/08/21 12:35 PM   Specimen: Nasopharyngeal Swab  Result Value Ref Range Status   SARS Coronavirus 2 NEGATIVE NEGATIVE Final    Comment: (NOTE) SARS-CoV-2 target nucleic acids are NOT DETECTED.  The SARS-CoV-2 RNA is generally detectable in upper and lower respiratory specimens during the acute phase of infection. Negative results do not preclude SARS-CoV-2 infection, do not rule out co-infections with other pathogens, and should not be used as the sole basis for treatment or other patient management decisions. Negative results must be combined with clinical observations, patient history, and epidemiological information. The expected result is Negative.  Fact Sheet for Patients: SugarRoll.be  Fact Sheet for Healthcare Providers: https://www.woods-mathews.com/  This test is not yet approved or cleared by the [place] FDA and  has been  authorized for detection and/or diagnosis of SARS-CoV-2 by FDA under an Emergency Use Authorization (EUA). This EUA will remain  in effect (meaning this test can be used) for the duration of the COVID-19 declaration under Se ction 564(b)(1) of the Act, 21 U.S.C. section 360bbb-3(b)(1), unless the authorization is terminated or revoked sooner.  Performed at [facility], [place] [address], [place], [place] [address]   Resp Panel by  RT-PCR (Flu A&B, Covid) Nasopharyngeal Swab     Status: None   Collection Time: 02/16/21 12:39 PM   Specimen: Nasopharyngeal Swab; Nasopharyngeal(NP) swabs in vial transport medium  Result Value Ref Range Status   SARS Coronavirus 2 by RT PCR NEGATIVE NEGATIVE Final    Comment: (NOTE) SARS-CoV-2 target nucleic acids are NOT DETECTED.  The SARS-CoV-2 RNA is generally detectable in upper respiratory specimens during the acute phase of infection. The lowest concentration of SARS-CoV-2 viral copies this assay can detect is 138 copies/mL. A negative result does not preclude SARS-Cov-2 infection and should not be used as the sole basis for treatment or other patient management decisions. A negative result may occur with  improper specimen collection/handling, submission of specimen other than nasopharyngeal swab, presence of viral mutation(s) within the areas targeted by this assay, and inadequate number of viral copies(<138 copies/mL). A negative result must be combined with clinical observations, patient history, and epidemiological information. The expected result is Negative.  Fact Sheet for Patients:  EntrepreneurPulse.com.au  Fact Sheet for Healthcare Providers:  IncredibleEmployment.be  This test is no t yet approved or cleared by the [place] FDA and  has been authorized for detection and/or diagnosis of SARS-CoV-2 by FDA under an Emergency Use Authorization (EUA). This EUA will remain  in effect (meaning  this test can be used) for the duration of the COVID-19 declaration under Section 564(b)(1) of the Act, 21 U.S.C.section 360bbb-3(b)(1), unless the authorization is terminated  or revoked sooner.       Influenza A by PCR NEGATIVE NEGATIVE Final   Influenza B by PCR NEGATIVE NEGATIVE Final    Comment: (NOTE) The Xpert Xpress SARS-CoV-2/FLU/RSV plus assay is intended as an aid in the diagnosis of influenza from Nasopharyngeal swab specimens and should not be used as a sole basis for treatment. Nasal washings and aspirates are unacceptable for Xpert Xpress SARS-CoV-2/FLU/RSV testing.  Fact Sheet for Patients: EntrepreneurPulse.com.au  Fact Sheet for Healthcare Providers: IncredibleEmployment.be  This test is not yet approved or cleared by the [place] FDA and has been authorized for detection and/or diagnosis of SARS-CoV-2 by FDA under an Emergency Use Authorization (EUA). This EUA will remain in effect (meaning this test can be used) for the duration of the COVID-19 declaration under Section 564(b)(1) of the Act, 21 U.S.C. section 360bbb-3(b)(1), unless the authorization is terminated or revoked.  Performed at [facility], [address] [place], [place] [address]      Labs: BNP (last 3 results) No results for input(s): BNP in the last 8760 hours. Basic Metabolic Panel: Recent Labs  Lab 02/11/21 0418 02/12/21 0430 02/13/21 0508 02/14/21 0512 02/17/21 0614  NA 138 139 139 139 140  K 4.6 4.4 4.3 4.0 5.3*  CL 111 112* 114* 110 113*  CO2 19* 21* 22 25 23   GLUCOSE 80 78 86 77 89  BUN 39* 29* 27* 25* 32*  CREATININE 1.38* 1.00 0.97 0.99 1.22  CALCIUM 7.9* 8.0* 7.9* 8.1* 8.0*  MG 1.8 1.7 1.8  --  1.9  PHOS 2.9 2.5  --   --   --    Liver Function Tests: No results for input(s): AST, ALT, ALKPHOS, BILITOT, PROT, ALBUMIN in the last 168 hours. No results for input(s): LIPASE, AMYLASE in the last 168 hours. No results  for input(s): AMMONIA in the last 168 hours. CBC: Recent Labs  Lab 02/11/21 0418 02/12/21 0430 02/13/21 0508 02/14/21 0512  WBC 5.6 5.6 5.5 5.9  HGB 11.5* 12.2* 12.2*  13.3  HCT 37.4* 38.4* 38.7* 42.4  MCV 93.7 91.2 92.8 91.8  PLT 159 182 172 190   Cardiac Enzymes: No results for input(s): CKTOTAL, CKMB, CKMBINDEX, TROPONINI in the last 168 hours. BNP: Invalid input(s): POCBNP CBG: No results for input(s): GLUCAP in the last 168 hours. D-Dimer No results for input(s): DDIMER in the last 72 hours. Hgb A1c No results for input(s): HGBA1C in the last 72 hours. Lipid Profile No results for input(s): CHOL, HDL, LDLCALC, TRIG, CHOLHDL, LDLDIRECT in the last 72 hours. Thyroid function studies No results for input(s): TSH, T4TOTAL, T3FREE, THYROIDAB in the last 72 hours.  Invalid input(s): FREET3 Anemia work up No results for input(s): VITAMINB12, FOLATE, FERRITIN, TIBC, IRON, RETICCTPCT in the last 72 hours. Urinalysis    Component Value Date/Time   COLORURINE YELLOW (A) 02/08/2021 0837   APPEARANCEUR CLEAR (A) 02/08/2021 0837   LABSPEC 1.020 02/08/2021 0837   PHURINE 5.0 02/08/2021 [place] 02/08/2021 0837   HGBUR NEGATIVE 02/08/2021 0837   BILIRUBINUR NEGATIVE 02/08/2021 0837   KETONESUR NEGATIVE 02/08/2021 0837   PROTEINUR NEGATIVE 02/08/2021 0837   NITRITE NEGATIVE 02/08/2021 0837   LEUKOCYTESUR NEGATIVE 02/08/2021 0837   Sepsis Labs Invalid input(s): PROCALCITONIN,  WBC,  LACTICIDVEN Microbiology Recent Results (from the past 240 hour(s))  SARS CORONAVIRUS 2 (TAT 6-24 HRS) Nasopharyngeal Nasopharyngeal Swab     Status: None   Collection Time: 02/08/21 12:35 PM   Specimen: Nasopharyngeal Swab  Result Value Ref Range Status   SARS Coronavirus 2 NEGATIVE NEGATIVE Final    Comment: (NOTE) SARS-CoV-2 target nucleic acids are NOT DETECTED.  The SARS-CoV-2 RNA is generally detectable in upper and lower respiratory specimens during the acute phase of  infection. Negative results do not preclude SARS-CoV-2 infection, do not rule out co-infections with other pathogens, and should not be used as the sole basis for treatment or other patient management decisions. Negative results must be combined with clinical observations, patient history, and epidemiological information. The expected result is Negative.  Fact Sheet for Patients: SugarRoll.be  Fact Sheet for Healthcare Providers: https://www.woods-mathews.com/  This test is not yet approved or cleared by the [place] FDA and  has been authorized for detection and/or diagnosis of SARS-CoV-2 by FDA under an Emergency Use Authorization (EUA). This EUA will remain  in effect (meaning this test can be used) for the duration of the COVID-19 declaration under Se ction 564(b)(1) of the Act, 21 U.S.C. section 360bbb-3(b)(1), unless the authorization is terminated or revoked sooner.  Performed at [facility], [place] [address], [place], [place] [address]   Resp Panel by RT-PCR (Flu A&B, Covid) Nasopharyngeal Swab     Status: None   Collection Time: 02/16/21 12:39 PM   Specimen: Nasopharyngeal Swab; Nasopharyngeal(NP) swabs in vial transport medium  Result Value Ref Range Status   SARS Coronavirus 2 by RT PCR NEGATIVE NEGATIVE Final    Comment: (NOTE) SARS-CoV-2 target nucleic acids are NOT DETECTED.  The SARS-CoV-2 RNA is generally detectable in upper respiratory specimens during the acute phase of infection. The lowest concentration of SARS-CoV-2 viral copies this assay can detect is 138 copies/mL. A negative result does not preclude SARS-Cov-2 infection and should not be used as the sole basis for treatment or other patient management decisions. A negative result may occur with  improper specimen collection/handling, submission of specimen other than nasopharyngeal swab, presence of viral mutation(s) within the areas targeted by this  assay, and inadequate number of viral copies(<138 copies/mL).  A negative result must be combined with clinical observations, patient history, and epidemiological information. The expected result is Negative.  Fact Sheet for Patients:  EntrepreneurPulse.com.au  Fact Sheet for Healthcare Providers:  IncredibleEmployment.be  This test is no t yet approved or cleared by the [place] FDA and  has been authorized for detection and/or diagnosis of SARS-CoV-2 by FDA under an Emergency Use Authorization (EUA). This EUA will remain  in effect (meaning this test can be used) for the duration of the COVID-19 declaration under Section 564(b)(1) of the Act, 21 U.S.C.section 360bbb-3(b)(1), unless the authorization is terminated  or revoked sooner.       Influenza A by PCR NEGATIVE NEGATIVE Final   Influenza B by PCR NEGATIVE NEGATIVE Final    Comment: (NOTE) The Xpert Xpress SARS-CoV-2/FLU/RSV plus assay is intended as an aid in the diagnosis of influenza from Nasopharyngeal swab specimens and should not be used as a sole basis for treatment. Nasal washings and aspirates are unacceptable for Xpert Xpress SARS-CoV-2/FLU/RSV testing.  Fact Sheet for Patients: EntrepreneurPulse.com.au  Fact Sheet for Healthcare Providers: IncredibleEmployment.be  This test is not yet approved or cleared by the [place] FDA and has been authorized for detection and/or diagnosis of SARS-CoV-2 by FDA under an Emergency Use Authorization (EUA). This EUA will remain in effect (meaning this test can be used) for the duration of the COVID-19 declaration under Section 564(b)(1) of the Act, 21 U.S.C. section 360bbb-3(b)(1), unless the authorization is terminated or revoked.  Performed at [facility], [address] [place], [place] [address]      Time coordinating discharge: Over 30 minutes  SIGNED:   Ezekiel Slocumb, DO [facility] 02/17/2021, 4:50 PM   If 7PM-7AM, please contact night-coverage [email or web address]

## 2021-02-17 NOTE — Progress Notes (Signed)
IV d/ced. Instructions given to both patient and receiving nurse.  All questions answered to receiving nurse and patient satisfaction. Wound care instructions given. Patient discharged via EMS.

## 2021-02-17 NOTE — Care Management Important Message (Signed)
Important Message  Patient Details  Name: Anthony Figueroa MRN: [id number] Date of Birth: 11/16/1946   Medicare Important Message Given:  Yes     Dannette Barbara 02/17/2021, 12:31 PM

## 2021-02-20 NOTE — Unmapped (Signed)
.  onct

## 2021-02-20 NOTE — Unmapped (Signed)
Returned call to [employer]. Called [employer]. They will be contacting pt to set up delivery. Co pay will be $85. Debara Pickett with info.States they will pay $85.Asked her to call back with any further questions or concerns.

## 2021-02-20 NOTE — Unmapped (Signed)
Hi,     Limda contacted the Communication Center requesting to speak with the care team of Hilary Pundt to discuss:    Still hasn't heard from Pharmacy about the [facility]    Please contact at [phone].    Program: GU  Speciality: Medical Oncology    Check Indicates criteria has been reviewed and confirmed with the patient:    []  Preferred Name   [x]  DOB and/or MR#  [x]  Preferred Contact Method  [x]  Phone Number(s)   []  MyChart     Thank you,   Vernie Ammons  [facility]   [phone]

## 2021-02-21 NOTE — Unmapped (Signed)
CONTINUING CARE NETWORK  SNF FOLLOW UP NOTE  Summary:  Douglas Gray is a 74 y.o. male is a patient currently at [facility] who is being followed by the [facility] Continuing [employer]. Plan is continue rehab.  Next follow up with SNF care team member in one week.        Subjective:   Interventions provided: medication reconciliation and chart review  Education provided: to notify CM of any change in patient condition or discharge plan  Barriers to care: Transportation and Fall Risk  Care Coordination Note updated in [facility]: Yes    Discuss at next visit: Transition planning     Objective:    Patient Active Problem List   Diagnosis   ??? Coronary artery disease involving native coronary artery of native heart without angina pectoris   ??? Valvular heart disease   ??? Nonrheumatic mitral valve regurgitation   ??? Nonrheumatic tricuspid valve regurgitation   ??? Status post coronary artery stent placement   ??? Hx of CABG   ??? Status post aortic valve replacement with bioprosthetic valve   ??? PVD (peripheral vascular disease) (CMS-HCC)   ??? Hyperlipidemia LDL goal <70   ??? Bilateral carotid artery stenosis   ??? Type 2 diabetes mellitus with stage 3 chronic kidney disease, without long-term current use of insulin (CMS-HCC)   ??? Stage 3 chronic kidney disease (CMS-HCC)   ??? Cigarette smoker   ??? Personal history of nicotine dependence   ??? Syncope   ??? Unstable balance   ??? Discomfort of back   ??? Intermittent palpitations   ??? Paroxysmal atrial fibrillation (CMS-HCC)   ??? Wide-complex tachycardia (CMS-HCC)   ??? Abnormal nuclear stress test   ??? Bradycardia   ??? Dyspnea on effort   ??? Hx of falling   ??? Gastrointestinal hemorrhage with melena   ??? Bone metastases (CMS-HCC)   ??? Elevated PSA   ??? 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 Age-related osteoporosis without current pathological fracture       Future Appointments   Date Time Provider Department Center   02/27/2021 12:30 PM ADULT ONC LAB [facility] 02/27/2021  1:30 PM Jennye Moccasin, MD ONCMULTI [facility]   02/27/2021  2:30 PM ONCINF CHAIR 18 [id number] [facility]   03/03/2021  6:15 AM [facility] [place] EP REMOTE MONITORING [facility] [place]   04/14/2021 11:00 AM Wiliam Ke, FNP [facility] [place]

## 2021-02-21 NOTE — Unmapped (Signed)
CONTINUING CARE NETWORK  Enrollment Note        Patient admitted to [facility] and enrolled in the Continuing Care Network. Case Management to follow up within 1 week to complete medication reconciliation and discuss transition planning.

## 2021-02-27 ENCOUNTER — Ambulatory Visit
Admit: 2021-02-27 | Discharge: 2021-03-11 | Disposition: A | Discharge status: Hospice | Payer: MEDICARE | Source: Ambulatory Visit | Admitting: Student in an Organized Health Care Education/Training Program

## 2021-02-27 ENCOUNTER — Ambulatory Visit
Admit: 2021-02-27 | Discharge: 2021-03-11 | Disposition: A | Discharge status: Hospice | Payer: MEDICARE | Source: Ambulatory Visit | Attending: Internal Medicine | Admitting: Student in an Organized Health Care Education/Training Program | Primary: Internal Medicine

## 2021-02-27 ENCOUNTER — Encounter
Admit: 2021-02-27 | Discharge: 2021-03-11 | Disposition: A | Discharge status: Hospice | Payer: MEDICARE | Source: Ambulatory Visit | Admitting: Student in an Organized Health Care Education/Training Program

## 2021-02-27 ENCOUNTER — Other Ambulatory Visit
Admit: 2021-02-27 | Discharge: 2021-03-11 | Disposition: A | Discharge status: Hospice | Payer: MEDICARE | Source: Ambulatory Visit | Admitting: Student in an Organized Health Care Education/Training Program

## 2021-02-27 DIAGNOSIS — C7951 Secondary malignant neoplasm of bone: Principal | ICD-10-CM

## 2021-02-27 DIAGNOSIS — C61 Malignant neoplasm of prostate: Principal | ICD-10-CM

## 2021-02-27 DIAGNOSIS — M810 Age-related osteoporosis without current pathological fracture: Principal | ICD-10-CM

## 2021-02-27 LAB — CBC W/ AUTO DIFF
BASOPHILS ABSOLUTE COUNT: 0.1 10*9/L (ref 0.0–0.1)
BASOPHILS ABSOLUTE COUNT: 0.1 10*9/L (ref 0.0–0.1)
BASOPHILS RELATIVE PERCENT: 1.3 %
BASOPHILS RELATIVE PERCENT: 0.9 %
EOSINOPHILS ABSOLUTE COUNT: 0.2 10*9/L (ref 0.0–0.5)
EOSINOPHILS ABSOLUTE COUNT: 0.1 10*9/L (ref 0.0–0.5)
EOSINOPHILS RELATIVE PERCENT: 2.9 %
EOSINOPHILS RELATIVE PERCENT: 1.5 %
HEMATOCRIT: 42.6 % (ref 39.0–48.0)
HEMATOCRIT: 39.3 % (ref 39.0–48.0)
HEMOGLOBIN: 14 g/dL (ref 12.9–16.5)
HEMOGLOBIN: 13.1 g/dL (ref 12.9–16.5)
LYMPHOCYTES ABSOLUTE COUNT: 1.6 10*9/L (ref 1.1–3.6)
LYMPHOCYTES ABSOLUTE COUNT: 0.9 10*9/L — ABNORMAL LOW (ref 1.1–3.6)
LYMPHOCYTES RELATIVE PERCENT: 23.4 %
LYMPHOCYTES RELATIVE PERCENT: 13 %
MEAN CORPUSCULAR HEMOGLOBIN CONC: 32.9 g/dL (ref 32.0–36.0)
MEAN CORPUSCULAR HEMOGLOBIN CONC: 33.2 g/dL (ref 32.0–36.0)
MEAN CORPUSCULAR HEMOGLOBIN: 29.5 pg (ref 25.9–32.4)
MEAN CORPUSCULAR HEMOGLOBIN: 29.4 pg (ref 25.9–32.4)
MEAN CORPUSCULAR VOLUME: 89.4 fL (ref 77.6–95.7)
MEAN CORPUSCULAR VOLUME: 88.6 fL (ref 77.6–95.7)
MEAN PLATELET VOLUME: 8 fL (ref 6.8–10.7)
MEAN PLATELET VOLUME: 7.3 fL (ref 6.8–10.7)
MONOCYTES ABSOLUTE COUNT: 0.4 10*9/L (ref 0.3–0.8)
MONOCYTES ABSOLUTE COUNT: 0.3 10*9/L (ref 0.3–0.8)
MONOCYTES RELATIVE PERCENT: 5.7 %
MONOCYTES RELATIVE PERCENT: 4.6 %
NEUTROPHILS ABSOLUTE COUNT: 4.4 10*9/L (ref 1.8–7.8)
NEUTROPHILS ABSOLUTE COUNT: 5.5 10*9/L (ref 1.8–7.8)
NEUTROPHILS RELATIVE PERCENT: 66.7 %
NEUTROPHILS RELATIVE PERCENT: 80 %
PLATELET COUNT: 306 10*9/L (ref 150–450)
PLATELET COUNT: 272 10*9/L (ref 150–450)
RED BLOOD CELL COUNT: 4.76 10*12/L (ref 4.26–5.60)
RED BLOOD CELL COUNT: 4.44 10*12/L (ref 4.26–5.60)
RED CELL DISTRIBUTION WIDTH: 17.2 % — ABNORMAL HIGH (ref 12.2–15.2)
RED CELL DISTRIBUTION WIDTH: 16.6 % — ABNORMAL HIGH (ref 12.2–15.2)
WBC ADJUSTED: 6.6 10*9/L (ref 3.6–11.2)
WBC ADJUSTED: 6.9 10*9/L (ref 3.6–11.2)

## 2021-02-27 LAB — COMPREHENSIVE METABOLIC PANEL
ALBUMIN: 2.2 g/dL — ABNORMAL LOW (ref 3.4–5.0)
ALKALINE PHOSPHATASE: 891 U/L — ABNORMAL HIGH (ref 46–116)
ALT (SGPT): 9 U/L — ABNORMAL LOW (ref 10–49)
ANION GAP: 7 mmol/L (ref 5–14)
AST (SGOT): 12 U/L (ref <=34)
BILIRUBIN TOTAL: 0.8 mg/dL (ref 0.3–1.2)
BLOOD UREA NITROGEN: 51 mg/dL — ABNORMAL HIGH (ref 9–23)
BUN / CREAT RATIO: 33
CALCIUM: 8.4 mg/dL — ABNORMAL LOW (ref 8.7–10.4)
CHLORIDE: 109 mmol/L — ABNORMAL HIGH (ref 98–107)
CO2: 26 mmol/L (ref 20.0–31.0)
CREATININE: 1.56 mg/dL — ABNORMAL HIGH
EGFR CKD-EPI AA MALE: 49 mL/min/{1.73_m2} — ABNORMAL LOW (ref >=60)
EGFR CKD-EPI NON-AA MALE: 43 mL/min/{1.73_m2} — ABNORMAL LOW (ref >=60)
GLUCOSE RANDOM: 106 mg/dL (ref 70–179)
POTASSIUM: 4 mmol/L (ref 3.4–4.8)
PROTEIN TOTAL: 5.2 g/dL — ABNORMAL LOW (ref 5.7–8.2)
SODIUM: 142 mmol/L (ref 135–145)

## 2021-02-27 LAB — PSA: PROSTATE SPECIFIC ANTIGEN: 59.64 ng/mL — ABNORMAL HIGH (ref 0.00–4.00)

## 2021-02-27 LAB — URINALYSIS WITH CULTURE REFLEX
BACTERIA: NONE SEEN /HPF
BILIRUBIN UA: NEGATIVE
BLOOD UA: NEGATIVE
GLUCOSE UA: NEGATIVE
HYALINE CASTS: 6 /LPF — ABNORMAL HIGH (ref 0–1)
KETONES UA: NEGATIVE
LEUKOCYTE ESTERASE UA: NEGATIVE
NITRITE UA: NEGATIVE
PH UA: 5 (ref 5.0–9.0)
PROTEIN UA: NEGATIVE
RBC UA: 1 /HPF (ref <=3)
SPECIFIC GRAVITY UA: 1.015 (ref 1.003–1.030)
SQUAMOUS EPITHELIAL: <1 /HPF (ref 0–5)
UROBILINOGEN UA: 0.2
WBC UA: 1 /HPF (ref <=2)

## 2021-02-27 LAB — LIPASE: LIPASE: 17 U/L (ref 12–53)

## 2021-02-27 LAB — HIGH SENSITIVITY TROPONIN I - SERIAL: HIGH SENSITIVITY TROPONIN I: 19 ng/L (ref <=53)

## 2021-02-27 LAB — HIGH SENSITIVITY TROPONIN I - 2 HOUR SERIAL
HIGH SENSITIVITY TROPONIN - DELTA (0-2H): 2 ng/L (ref <=7)
HIGH-SENSITIVITY TROPONIN I - 2 HOUR: 17 ng/L (ref <=53)

## 2021-02-27 LAB — BLOOD GAS, VENOUS
BASE EXCESS VENOUS: 1 (ref -2.0–2.0)
FIO2 VENOUS: 28
HCO3 VENOUS: 26 mmol/L (ref 22–27)
O2 SATURATION VENOUS: 13 % — ABNORMAL LOW (ref 40.0–85.0)
PCO2 VENOUS: 46 mmHg (ref 40–60)
PH VENOUS: 7.36 (ref 7.32–7.43)
PO2 VENOUS: <20 mmHg — ABNORMAL LOW (ref 30–55)

## 2021-02-27 LAB — CALCIUM: CALCIUM: 8.5 mg/dL — ABNORMAL LOW (ref 8.7–10.4)

## 2021-02-27 LAB — CREATININE
CREATININE: 1.47 mg/dL — ABNORMAL HIGH
EGFR CKD-EPI AA MALE: 53 mL/min/{1.73_m2} — ABNORMAL LOW (ref >=60)
EGFR CKD-EPI NON-AA MALE: 46 mL/min/{1.73_m2} — ABNORMAL LOW (ref >=60)

## 2021-02-27 LAB — TESTOSTERONE: TESTOSTERONE TOTAL: 14 ng/dL — ABNORMAL LOW

## 2021-02-27 LAB — HIGH SENSITIVITY TROPONIN I - 6 HOUR SERIAL
HIGH SENSITIVITY TROPONIN - DELTA (2-6H): 4 ng/L (ref <=7)
HIGH-SENSITIVITY TROPONIN I - 6 HOUR: 21 ng/L (ref <=53)

## 2021-02-27 MED ADMIN — iohexoL (OMNIPAQUE) 350 mg iodine/mL solution 100 mL: 100 mL | INTRAVENOUS | @ 23:00:00 | Stop: 2021-02-27

## 2021-02-27 MED ADMIN — lactated ringers bolus 1,000 mL: 1000 mL | INTRAVENOUS | @ 19:00:00 | Stop: 2021-02-27

## 2021-02-27 MED ADMIN — HYDROmorphone (PF) (DILAUDID) injection 0.5 mg: .5 mg | INTRAVENOUS | @ 20:00:00 | Stop: 2021-02-27

## 2021-02-27 NOTE — Unmapped (Signed)
Code Medic from clinic for hypotension and RLQ abdominal pain.

## 2021-02-27 NOTE — Unmapped (Addendum)
[facility]  Emergency Department Provider Note       ED Clinical Impression     Final diagnoses:   Pathological fracture of right femur due to neoplastic disease, initial encounter (CMS-HCC) (Primary)        Impression, ED Course, Assessment and Plan     Impression: 74 y.o. male with past medical history notable for Metastatic Prostate cancer who presents today from chemo infusion center for hypotension, and worsening abd and right leg pain. Pt was recently admited at [facility] health recently for rib fractures after a fall, pt reports no new trauma or falls, but worsening pain. Here  CT abd showed: Numerous pathologic right sided displaced posterior rib fractures, right femur fracture, and a right sided pneumothorax,and increased osseous metastases. VS remained stable, and pt reported no respiratory  sxs or c/p. BP improved after giving IVFs. Sharin Mons was notified who recommend further imagining, and MAO was paged for admission. Sign out was given to Dr. Ollen Gross art 2100.      BP 103/65  - Pulse 77  - Temp 36.8 ??C (98.3 ??F) (Axillary)  - Resp 12  - SpO2 94%       Orders Placed This Encounter   Procedures   ??? Blood Culture #1   ??? Blood Culture #2   ??? COVID-19 PCR   ??? CT Abdomen Pelvis W Contrast   ??? XR Chest 2 views   ??? XR Foot 3 Or More Views Right   ??? XR Ankle 3 or More Views Bilateral   ??? XR Trauma Hip Bilateral   ??? Comprehensive Metabolic Panel   ??? Lipase Level   ??? CBC w/ Differential   ??? hsTroponin I (serial 0-2-6H w/ delta)   ??? Urinalysis with Culture Reflex   ??? Lactic Acid, Venous, Whole Blood   ??? hsTroponin I - 2 Hour   ??? hsTroponin I - 6 Hour   ??? Blood Gas, Venous   ??? ECG 12 Lead   ??? ED Admit Decision (ADT9)       ED Course as of 02/28/21 0035   Mon Feb 27, 2021   1552 BP appears to be improving with IVFs   1552 Will give 0.5mg  of Diladud   1553 CTA to r/o acute abd, pt having RLQ pain   1706 BP improved to SBPs 120s, and pain impoving   2000 CT showed pahologic fractures, and pneumothroax.   2118 MAO paged for admission    2119 Gave 1mg  of Dilaudid for pain    2127 Orho paged       ____________________________________________    The case was discussed with Dr. Georgianne Fick, who is in agreement with the above assessment and plan.    Dictation software was used while making this note. Please excuse any errors made with dictation software.     Additional Medical Decision Making     I have reviewed the vital signs and the nursing notes. Labs and radiology results that were available during my care of the patient were independently reviewed by me and considered in my medical decision making.     I independently visualized the EKG tracing if performed.  I independently visualized the radiology images if performed.  I reviewed the patient's prior medical records if available.  Additional history obtained from family if available.  I discussed the case with the admitting provider and the consulting services if the patient was admitted and/or consulting services were utilized.     History  Chief Complaint  Chief Complaint   Patient presents with   ??? Hypotension       HPI   Douglas Gray is a 75 y.o. male with past medical history notable for 75 y.o. hx of HTN, CABG 2/2 CAD, s/p bovine AVR, CKD3, PVD, gastric bypass, PUD, and metastatic prostate caner. Who presents today day from chemo infusion center for hypotension, and worsening abd and tight leg pain.   Pt was recently amited at [facility] health recently for rib fractures after a fall and was discharge to an inpt rehab at the rehab he reports decreased BP on standing as well as vertigo on standing. He reports no new trauma or falls since but reports decreased BP on standing. Today he presented to the chemo infusion center where it noticed that his SBP was in the 80s, and he was subsequently brought to [facility] ED. Over the past several weeks her reports worsening RLQ abd pain as well and right hip and leg pain despite no falls or trauma (since the fall prior to his last admission).     ROS:   Constitutional: Negative for fever.  Eyes: Negative for visual changes.  ENT: Negative for sore throat.  Cardiovascular: Negative for palpitations. Negative for chest pain.  Respiratory: Negative for shortness of breath.  Gastrointestinal: Negative for abdominal pain, vomiting, or diarrhea.  Genitourinary: Negative for dysuria.   Musculoskeletal: See above  Skin: Negative for rash.  Neurological: Negative for headaches, focal weakness, or numbness.     All other systems have been reviewed and are negative except as otherwise documented.    Past Medical History:   Diagnosis Date   ??? Anxiety    ??? Arthritis    ??? Cataract    ??? CHF (congestive heart failure) (CMS-HCC)    ??? Chronic hypertension 02/25/2004   ??? Chronic kidney disease, stage 3, mod decreased GFR (CMS-HCC)     Cindra Presume, MD   ??? Cigarette smoker    ??? COPD (chronic obstructive pulmonary disease) (CMS-HCC)    ??? Coronary artery disease 02/25/2004    Prior CABG and coronary stents.   ??? Depression    ??? Diabetes mellitus type II, controlled, with no complications (CMS-HCC) 11/10/2008   ??? DVT (deep vein thrombosis) in pregnancy 06/28/2006   ??? Heart murmur    ??? Hemorrhage of gastrointestinal tract 01/12/2010   ??? History of nephrolithiasis 2013    with urosepsis.   ??? History of pneumonia    ??? Hx of CABG    ??? Hyperlipidemia LDL goal <70 02/25/2004   ??? Leg pain    ??? Lymphedema 06/07/2009    Recommended rest, elevation, compression stockings.   ??? Micturition frequency    ??? Myocardial infarction (CMS-HCC)    ??? Obesity 04/22/2007   ??? Peptic ulceration    ??? Peripheral artery disease (CMS-HCC) 06/28/2006   ??? Peripheral edema    ??? 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 Sinus infection     admited to [facility] med   ??? Syncope and collapse    ??? Valvular heart disease        Past Surgical History:   Procedure Laterality Date   ??? AVR with CABG  05/08/2007    Aortic valve replacement with a 25 mm Edwards-magna pericardial valve and saphenous vein graft to the distal right coronary artery and posterior descending artery. Jerilee Hoh, MD.   ??? BARIATRIC SURGERY  2010   ??? CARDIAC CATHETERIZATION     ???  CARDIAC VALVE REPLACEMENT     ??? CHOLECYSTECTOMY     ??? CORONARY ARTERY BYPASS GRAFT     ??? Coronary stents - LCX      3 x 24 & 3.5 x 16 Taxus   ??? CYSTOSCOPY WITH LEFT URETEROSCOPY. LASER LITHOTRIPSY AND BASKET STONE EXTRACTION  05/09/2012    M. Isaac Bliss, MD   ??? EYE SURGERY     ??? HERNIA REPAIR     ??? PR CATH PLACE/CORON ANGIO, IMG SUPER/INTERP,W LEFT HEART VENTRICULOGRAPHY N/A 03/24/2020    Procedure: LEFT HEART CATHETERIZATION WITH POSSIBLE REVASCULARIZATION;  Surgeon: Lesle Chris, MD;  Location: [place] CATH;  Service: Cardiology   ??? PR COLONOSCOPY FLX DX W/COLLJ SPEC WHEN PFRMD N/A 11/28/2020    Procedure: COLONOSCOPY, FLEXIBLE, PROXIMAL TO SPLENIC FLEXURE; DIAGNOSTIC, W/WO COLLECTION SPECIMEN BY BRUSH OR WASH;  Surgeon: Beverly Milch, MD;  Location: [facility];  Service: Gastrointestinal   ??? PR INSERTION SUBQ CARDIAC RHYTHM MONITOR W/PRGRMG N/A 04/21/2020    Procedure: LOOP RECORDER IMPLANTATION;  Surgeon: Bettye Boeck, MD;  Location: [place] [facility]ATH;  Service: Cardiology   ??? PR UPPER GI ENDOSCOPY,BIOPSY N/A 11/28/2020    Procedure: UGI ENDOSCOPY; WITH BIOPSY, SINGLE OR MULTIPLE;  Surgeon: Beverly Milch, MD;  Location: [facility];  Service: Gastrointestinal   ??? PR UPPER GI ENDOSCOPY,DIAGNOSIS N/A 01/27/2021    Procedure: UGI ENDO, INCLUDE ESOPHAGUS, STOMACH, & DUODENUM &/OR JEJUNUM; DX W/WO COLLECTION SPECIMN, BY BRUSH OR WASH;  Surgeon: Annie Paras, MD;  Location: [facility];  Service: Gastroenterology   ??? PRIOR BILATERAL PATELLA SURGERY  1968 & 1973   ??? VAGOTOMY         No current facility-administered medications for this encounter.    Current Outpatient Medications:   ???  acetaminophen (TYLENOL) 500 MG tablet, Take 1,000 mg by mouth every eight (8) hours as needed for pain. , Disp: , Rfl:   ???  ascorbic acid, vitamin C, (VITAMIN C) 500 MG tablet, Take 500 mg by mouth daily., Disp: , Rfl:   ???  aspirin (ECOTRIN) 81 MG tablet, Take 81 mg by mouth daily., Disp: , Rfl:   ???  bisacodyL (DULCOLAX) 5 mg EC tablet, Take 10 mg by mouth daily as needed for constipation., Disp: , Rfl:   ???  citalopram (CELEXA) 20 MG tablet, Take 20 mg by mouth daily. , Disp: , Rfl:   ???  docusate sodium (COLACE) 100 MG capsule, Take 100 mg by mouth Two (2) times a day., Disp: , Rfl:   ???  dronabinoL (MARINOL) 5 MG capsule, Take 5 mg by mouth Two (2) times a day (30 minutes before a meal)., Disp: , Rfl:   ???  enzalutamide (XTANDI) 80 mg tablet, Take 2 tablets (160 mg total) by mouth daily. Swallow tablets whole. Do not cut, crush, or chew the tablets., Disp: 60 tablet, Rfl: 5  ???  ergocalciferol-1,250 mcg, 50,000 unit, (VITAMIN D2-1,250 MCG, 50,000 UNIT,) 1,250 mcg (50,000 unit) capsule, Take 1,250 mcg by mouth once a week., Disp: , Rfl:   ???  furosemide (LASIX) 40 MG tablet, Take 40 mg by mouth daily as needed. , Disp: , Rfl:   ???  lidocaine (LIDODERM) 5 % patch, Place 1 patch on the skin daily. Apply to affected area for 12 hours only each day (then remove patch) , Disp: , Rfl:   ???  lisinopriL (PRINIVIL,ZESTRIL) 40 MG tablet, Take 1 tablet by mouth daily. (Patient not taking: Reported on 02/27/2021), Disp:  30 tablet, Rfl: 11  ???  meclizine (ANTIVERT) 25 mg tablet, Chew 25 mg Three (3) times a day as needed., Disp: , Rfl:   ???  midodrine (PROAMATINE) 5 MG tablet, Take 5 mg by mouth Three (3) times a day., Disp: , Rfl:   ???  oxyCODONE (OXYCONTIN) 10 mg TR12 12 hr crush resistant ER/CR tablet, Take by mouth every twelve (12) hours., Disp: , Rfl:   ???  oxyCODONE (ROXICODONE) 5 MG immediate release tablet, Take 1 to 2 tablets by mouth every four hours as needed for pain., Disp: 120 tablet, Rfl: 0  ???  oxyCODONE myristate (XTAMPZA ER) 13.5 mg CSpT, Take by mouth two (2) times a day., Disp: 60 each, Rfl: 0  ???  polyethylene glycol (MIRALAX) 17 gram packet, Take 17 g by mouth daily., Disp: , Rfl:   ???  polysaccharide iron complex (NIFEREX) 150 mg iron capsule, Take 150 mg by mouth daily., Disp: , Rfl:   ???  relugolix (ORGOVYX) 120 mg tablet, Take 1 tablet (120 mg total) by mouth daily. Take with or without food. Swallow tablets whole. Do not crush or chew tablet., Disp: 30 tablet, Rfl: 5  ???  rosuvastatin (CRESTOR) 5 MG tablet, Take 1 tablet (5 mg total) by mouth nightly., Disp: 30 tablet, Rfl: 11  ???  senna (SENOKOT) 8.6 mg tablet, Take 1 tablet by mouth daily., Disp: , Rfl:   ???  traZODone (DESYREL) 50 MG tablet, Take 150 mg by mouth nightly. , Disp: , Rfl: 2    Facility-Administered Medications Ordered in Other Encounters:   ???  degarelix (FIRMAGON) injection 80 mg, 80 mg, Subcutaneous, Once, Evalina Field, MD    Allergies  Metformin, Mirtazapine, Janumet [sitagliptin-metformin], and Indomethacin    Family History   Problem Relation Age of Onset   ??? Heart disease Mother    ??? Stroke Mother    ??? Cancer Father    ??? Depression Father    ??? Heart disease Father    ??? Breast cancer Sister    ??? Heart attack Brother    ??? Heart attack Brother    ??? Heart attack Brother        Social History  Social History     Tobacco Use   ??? Smoking status: Former Smoker     Packs/day: 0.50     Years: 55.00     Pack years: 27.50     Types: Cigarettes   ??? Smokeless tobacco: Never Used   ??? Tobacco comment: Quit 5 weeks ago   Vaping Use   ??? Vaping Use: Never used   Substance Use Topics   ??? Alcohol use: Never   ??? Drug use: Never        Physical Exam     VITAL SIGNS:      Vitals:    02/27/21 1910 02/27/21 1936 02/27/21 2100 02/27/21 2303   BP: 130/76 130/76 127/56 103/65   Pulse: 54 55 62 77   Resp: 17 12 12 12    Temp:       TempSrc:       SpO2: 100% 95% 97% 94%       Constitutional: Alert and oriented. Frail and Ill appearing   Eyes: Conjunctivae are normal.  HEENT: Normocephalic and atraumatic. Conjunctivae clear. No congestion. Moist mucous membranes.   Cardiovascular: Normal rate, regular rhythm. Normal and symmetric distal pulses. Brisk capillary refill. Normal skin turgor.  Respiratory: Normal respiratory effort. Breath sounds are normal. No use of accesory muscles.  There are no wheezing or crackles heard.  Gastrointestinal: RLQ tenderness, non-distended.  Genitourinary: Deferred.  Musculoskeletal: Strength intact and symmetric of UEs, decreased strength ROM of RLE  Neurologic: Normal speech and language. No gross focal neurologic deficits are appreciated. Patient is moving all extremities equally, face is symmetric at rest and with speech.  Skin: Skin is warm, dry and intact. No rash noted.  Psychiatric: Mood and affect are normal. Speech and behavior are normal.     Radiology     XR Chest 2 views   Final Result   Small right pneumothorax with numerous right-sided rib fractures.      CT Abdomen Pelvis W Contrast   Final Result   Addendum 1 of 1   Focal short segment wall thickening of the sigmoid colon appears similar    to the prior from 10/13/2015 and could be secondary to colonic    diverticulosis and associated chronic inflammation.      Final   1.Numerous pathologic right sided displaced posterior rib fractures with a right sided pneumothorax, incompletely visualized. There is associated small volume pleural effusion the right hemithorax. Small volume hemomothorax is also the differential.   2.Nondisplaced pathologic right intertrochanteric femur fracture.   3.Extensive osseous metastases, with extensive increase in number and size of sclerotic lesions throughout all visualized osseous structures. These findings could be secondary progressive metastatic disease. Treatment response is less likely due to the development of fractures. However, to the jejunal loops is not excluded since the left pelvic sidewall enlarged lymph nodes and right iliac bone soft tissue component have shown response and size decrease. Correlation with nonemergent bone scan is recommended.   4.Interval decrease in size of the previously identified left external left external iliac adenopathy which is now subcentimeter in size measuring 0.7 cm.   5.Interval significant decrease in size of previous identified soft tissue component seen in the right iliac bone (series 2 image 91) measuring 1.3 cm (previously 3.2 cm).         The first two Impressions of this study were discussed via secure EPIC chat with DR. Leo Grosser by Dr. Daria Pastures on 02/27/2021 6:56 PM.      XR Foot 3 Or More Views Right    (Results Pending)   XR Ankle 3 or More Views Bilateral    (Results Pending)   XR Trauma Hip Bilateral    (Results Pending)        Laboratory Data     Lab Results   Component Value Date    WBC 6.9 02/27/2021    HGB 13.1 02/27/2021    HCT 39.3 02/27/2021    PLT 272 02/27/2021       Lab Results   Component Value Date    NA 142 02/27/2021    K 4.0 02/27/2021    CL 109 (H) 02/27/2021    CO2 26.0 02/27/2021    BUN 51 (H) 02/27/2021    CREATININE 1.56 (H) 02/27/2021    GLU 106 02/27/2021    CALCIUM 8.4 (L) 02/27/2021    MG 2.0 05/25/2020    PHOS 2.1 (L) 04/09/2012       Lab Results   Component Value Date    BILITOT 0.8 02/27/2021    BILIDIR 0.2 04/10/2012    PROT 5.2 (L) 02/27/2021    ALBUMIN 2.2 (L) 02/27/2021    ALT 9 (L) 02/27/2021    AST 12 02/27/2021    ALKPHOS 891 (H) 02/27/2021  Lab Results   Component Value Date    LABPROT 13.9 12/03/2012    INR 1.11 12/01/2020    APTT 29.0 03/24/2020       Pertinent labs & imaging results that were available during my care of the patient were reviewed by me and considered in my medical decision making (see chart for details).    Portions of this record have been created using Scientist, clinical (histocompatibility and immunogenetics). Dictation errors have been sought, but may not have been identified and corrected.       Leo Grosser, MD  Resident  02/28/21 0037       Leo Grosser, MD  Resident  02/28/21 1610       Leo Grosser, MD  Resident  02/28/21 [id number]       Leo Grosser, MD  Resident  02/28/21 [id number]       Leo Grosser, MD  Resident  02/28/21 [phone]

## 2021-02-27 NOTE — Unmapped (Signed)
PIV inserted and labs drawn with no complications by jane R  .  PIV flushed with saline.

## 2021-02-27 NOTE — Unmapped (Signed)
Patient stated that he did not feel well. He stated, he had pain in his right side, pain level 8/10, felt weak, light headed. He had altered mental status. He is hypotensive, b/p automatic 81/44 and manual 78/48. Patient's daughter is with him. Notified the Physician of findings. She stated to send patient to the ED. [facility] was called. They arrived and transport patient to the ED. Patient was Alert, oriented x3, and verbal upon leaving.

## 2021-02-28 LAB — CBC W/ AUTO DIFF
BASOPHILS ABSOLUTE COUNT: 0.1 10*9/L (ref 0.0–0.1)
BASOPHILS RELATIVE PERCENT: 1.1 %
EOSINOPHILS ABSOLUTE COUNT: 0.1 10*9/L (ref 0.0–0.5)
EOSINOPHILS RELATIVE PERCENT: 1.5 %
HEMATOCRIT: 34.8 % — ABNORMAL LOW (ref 39.0–48.0)
HEMOGLOBIN: 11.6 g/dL — ABNORMAL LOW (ref 12.9–16.5)
LYMPHOCYTES ABSOLUTE COUNT: 1.4 10*9/L (ref 1.1–3.6)
LYMPHOCYTES RELATIVE PERCENT: 18.1 %
MEAN CORPUSCULAR HEMOGLOBIN CONC: 33.4 g/dL (ref 32.0–36.0)
MEAN CORPUSCULAR HEMOGLOBIN: 29.4 pg (ref 25.9–32.4)
MEAN CORPUSCULAR VOLUME: 88.1 fL (ref 77.6–95.7)
MEAN PLATELET VOLUME: 7.8 fL (ref 6.8–10.7)
MONOCYTES ABSOLUTE COUNT: 0.5 10*9/L (ref 0.3–0.8)
MONOCYTES RELATIVE PERCENT: 6.6 %
NEUTROPHILS ABSOLUTE COUNT: 5.8 10*9/L (ref 1.8–7.8)
NEUTROPHILS RELATIVE PERCENT: 72.7 %
PLATELET COUNT: 263 10*9/L (ref 150–450)
RED BLOOD CELL COUNT: 3.95 10*12/L — ABNORMAL LOW (ref 4.26–5.60)
RED CELL DISTRIBUTION WIDTH: 16.9 % — ABNORMAL HIGH (ref 12.2–15.2)
WBC ADJUSTED: 8 10*9/L (ref 3.6–11.2)

## 2021-02-28 LAB — TSH: THYROID STIMULATING HORMONE: 6.58 u[IU]/mL — ABNORMAL HIGH (ref 0.550–4.780)

## 2021-02-28 LAB — HEPATIC FUNCTION PANEL
ALBUMIN: 2 g/dL — ABNORMAL LOW (ref 3.4–5.0)
ALKALINE PHOSPHATASE: 779 U/L — ABNORMAL HIGH (ref 46–116)
ALT (SGPT): 7 U/L — ABNORMAL LOW (ref 10–49)
AST (SGOT): 12 U/L (ref <=34)
BILIRUBIN DIRECT: 0.6 mg/dL — ABNORMAL HIGH (ref 0.00–0.30)
BILIRUBIN TOTAL: 1 mg/dL (ref 0.3–1.2)
PROTEIN TOTAL: 4.5 g/dL — ABNORMAL LOW (ref 5.7–8.2)

## 2021-02-28 LAB — BASIC METABOLIC PANEL
ANION GAP: 9 mmol/L (ref 5–14)
BLOOD UREA NITROGEN: 42 mg/dL — ABNORMAL HIGH (ref 9–23)
BUN / CREAT RATIO: 30
CALCIUM: 8.4 mg/dL — ABNORMAL LOW (ref 8.7–10.4)
CHLORIDE: 108 mmol/L — ABNORMAL HIGH (ref 98–107)
CO2: 23 mmol/L (ref 20.0–31.0)
CREATININE: 1.41 mg/dL — ABNORMAL HIGH
EGFR CKD-EPI AA MALE: 56 mL/min/{1.73_m2} — ABNORMAL LOW (ref >=60)
EGFR CKD-EPI NON-AA MALE: 48 mL/min/{1.73_m2} — ABNORMAL LOW (ref >=60)
GLUCOSE RANDOM: 86 mg/dL (ref 70–179)
POTASSIUM: 3.9 mmol/L (ref 3.4–4.8)
SODIUM: 140 mmol/L (ref 135–145)

## 2021-02-28 LAB — LACTATE, VENOUS, WHOLE BLOOD: LACTATE BLOOD VENOUS: 2 mmol/L — ABNORMAL HIGH (ref 0.5–1.8)

## 2021-02-28 LAB — PROTIME-INR
INR: 1.13
PROTIME: 13.2 s (ref 10.3–13.4)

## 2021-02-28 LAB — T4, FREE: FREE T4: 1.21 ng/dL (ref 0.89–1.76)

## 2021-02-28 LAB — VITAMIN B12: VITAMIN B-12: 1234 pg/mL — ABNORMAL HIGH (ref 211–911)

## 2021-02-28 LAB — MAGNESIUM: MAGNESIUM: 1.5 mg/dL — ABNORMAL LOW (ref 1.6–2.6)

## 2021-02-28 LAB — PHOSPHORUS: PHOSPHORUS: 3.8 mg/dL (ref 2.4–5.1)

## 2021-02-28 MED ADMIN — neostigmine (BLOXIVERZ) injection: INTRAVENOUS | @ 19:00:00 | Stop: 2021-02-28

## 2021-02-28 MED ADMIN — ketamine (KETALAR) injection: INTRAVENOUS | @ 17:00:00 | Stop: 2021-02-28

## 2021-02-28 MED ADMIN — fentaNYL (PF) (SUBLIMAZE) injection: INTRAVENOUS | @ 17:00:00 | Stop: 2021-02-28

## 2021-02-28 MED ADMIN — ondansetron (ZOFRAN) injection: INTRAVENOUS | @ 18:00:00 | Stop: 2021-02-28

## 2021-02-28 MED ADMIN — phenylephrine 0.8 mg/10 mL (80 mcg/mL) injection: INTRAVENOUS | @ 17:00:00 | Stop: 2021-02-28

## 2021-02-28 MED ADMIN — lactated Ringers infusion: INTRAVENOUS | @ 17:00:00 | Stop: 2021-02-28

## 2021-02-28 MED ADMIN — fentaNYL (PF) (SUBLIMAZE) injection 25 mcg: 25 ug | INTRAVENOUS | @ 20:00:00 | Stop: 2021-02-28

## 2021-02-28 MED ADMIN — phenylephrine 0.8 mg/10 mL (80 mcg/mL) injection: INTRAVENOUS | @ 18:00:00 | Stop: 2021-02-28

## 2021-02-28 MED ADMIN — fentaNYL (PF) (SUBLIMAZE) injection 25 mcg: 25 ug | INTRAVENOUS | @ 21:00:00 | Stop: 2021-02-28

## 2021-02-28 MED ADMIN — ceFAZolin (ANCEF) injection: INTRAVENOUS | @ 18:00:00 | Stop: 2021-02-28

## 2021-02-28 MED ADMIN — oxyCODONE (ROXICODONE) immediate release tablet 10 mg: 10 mg | ORAL | @ 08:00:00 | Stop: 2021-03-14

## 2021-02-28 MED ADMIN — phenylephrine 0.8 mg/10 mL (80 mcg/mL) injection: INTRAVENOUS | @ 19:00:00 | Stop: 2021-02-28

## 2021-02-28 MED ADMIN — fentaNYL (PF) (SUBLIMAZE) injection: INTRAVENOUS | @ 19:00:00 | Stop: 2021-02-28

## 2021-02-28 MED ADMIN — phenylephrine 20 mg in sodium chloride 0.9% 250 mL (80 mcg/mL) infusion PMB: INTRAVENOUS | @ 18:00:00 | Stop: 2021-02-28

## 2021-02-28 MED ADMIN — ROCuronium (ZEMURON) injection: INTRAVENOUS | @ 17:00:00 | Stop: 2021-02-28

## 2021-02-28 MED ADMIN — magnesium sulfate 2gm/50mL IVPB: 2 g | INTRAVENOUS | @ 13:00:00 | Stop: 2021-02-28

## 2021-02-28 MED ADMIN — sodium chloride irrigation (NS) 0.9 % irrigation solution: @ 18:00:00 | Stop: 2021-02-28

## 2021-02-28 MED ADMIN — acetaminophen (TYLENOL) tablet 1,000 mg: 1000 mg | ORAL | @ 06:00:00

## 2021-02-28 MED ADMIN — oxyCODONE (ROXICODONE) immediate release tablet 10 mg: 10 mg | ORAL | @ 23:00:00 | Stop: 2021-03-14

## 2021-02-28 MED ADMIN — glycopyrrolate (ROBINUL) injection: INTRAVENOUS | @ 18:00:00 | Stop: 2021-02-28

## 2021-02-28 MED ADMIN — bupivacaine (PF) (MARCAINE) 0.25 % (2.5 mg/mL) injection (PF): PERINEURAL | @ 17:00:00 | Stop: 2021-02-28

## 2021-02-28 MED ADMIN — senna (SENOKOT) tablet 2 tablet: 2 | ORAL | @ 06:00:00

## 2021-02-28 MED ADMIN — lidocaine (XYLOCAINE) 20 mg/mL (2 %) injection: INTRAVENOUS | @ 17:00:00 | Stop: 2021-02-28

## 2021-02-28 MED ADMIN — norepinephrine 8 mg in sodium chloride 0.9 % 250 mL (32mcg/mL) infusion PMB: INTRAVENOUS | @ 18:00:00 | Stop: 2021-02-28

## 2021-02-28 MED ADMIN — succinylcholine (ANECTINE) injection: INTRAVENOUS | @ 17:00:00 | Stop: 2021-02-28

## 2021-02-28 MED ADMIN — acetaminophen (OFIRMEV) 10 mg/mL injection: INTRAVENOUS | @ 18:00:00 | Stop: 2021-02-28

## 2021-02-28 MED ADMIN — ePHEDrine (PF) 25 mg/5 mL (5 mg/mL) in 0.9% sodium chloride syringe Syrg: INTRAVENOUS | @ 18:00:00 | Stop: 2021-02-28

## 2021-02-28 MED ADMIN — glycopyrrolate (ROBINUL) injection: INTRAVENOUS | @ 19:00:00 | Stop: 2021-02-28

## 2021-02-28 MED ADMIN — ePHEDrine (PF) 25 mg/5 mL (5 mg/mL) in 0.9% sodium chloride syringe Syrg: INTRAVENOUS | @ 17:00:00 | Stop: 2021-02-28

## 2021-02-28 MED ADMIN — HYDROmorphone (PF) (DILAUDID) injection 2 mg: 2 mg | INTRAVENOUS | @ 02:00:00 | Stop: 2021-02-27

## 2021-02-28 MED ADMIN — dexamethasone (DECADRON) 4 mg/mL injection: INTRAVENOUS | @ 18:00:00 | Stop: 2021-02-28

## 2021-02-28 MED ADMIN — propofoL (DIPRIVAN) injection: INTRAVENOUS | @ 17:00:00 | Stop: 2021-02-28

## 2021-02-28 MED ADMIN — ePHEDrine (PF) 25 mg/5 mL (5 mg/mL) in 0.9% sodium chloride syringe Syrg: INTRAVENOUS | @ 19:00:00 | Stop: 2021-02-28

## 2021-02-28 MED ADMIN — dexmedeTOMIDine (PRECEDEX) injection: INTRAVENOUS | @ 17:00:00 | Stop: 2021-02-28

## 2021-02-28 NOTE — Unmapped (Addendum)
Items for outpatient follow up:    [ ]  Discharging home on hospice with comfort meds  [ ]  Have stopped Marinol, meclizine, Oxycontin, and Xtampza 2/2 cognitive s/e    Douglas Gray is a 75 y.o. 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 Hospital course by problem below.    #Stage IV HSPC - Pathologic Right Rib Fractures I Small Pneumothorax - Right pathologic greater trochanteric femur fracture with possible intertrochanteric extension: PSA 1500, Gleason 9, metastatic to bone with high volume disease. Now on ADT s/p degarelix 11/2020 and was changed to relugolix 01/17/2021 and enzalutamide 02/01/21  (per ENZAMET and ARCHES). History of progressive R hip pain over the last 6-7 months. Found to have a R femur fracture on admission, reportedly new from 3/25 R femur XR completed at [facility]. S/p fixation on 4/6. Imaging on admission includes additional acute right rib displaced fractures now including at least the 6-10th posterior ribs with small associated pneumothorax and small volume pleural effusion the right hemithorax. No oxygen requirement. Discharged on PRN oxy alone given confusion with many other medications.  After extensive discussions with patient and his wife, they elected for discharge home with home hospice, and thus his Relugolix and Enzalutamide will be stopped.     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 Plan to discuss prior to discharge with patient and wife, but if unable to, will notify primary oncologist who can discuss this with them further.     #Altered mental status - delirium - visual and auditory hallucinations - Polypharmacy:  On admission, was disoriented and confused, and did report auditory and visual hallucinations.  This improved with discontinuation of Marinol, standing oxycodone, and meclizine.  Overall we feel this was 2/2 delirium and polypharmacy.  Thus, we have discontinued his standing oxycodone, meclizine, and Marinol on discharge.  Pain control will be achieved with as needed oxycodone until hospice care begins.    #Orthostatic hypotension: Blood pressure supine stabilized, so midodrine was discontinued.  Prior work-up outpatient at [facility] neurology and outside cardiology demonstrated no neurologic or cardiac etiology.  Did obtain MRI/MRA while inpatient which showed only bony calvarial metastases without intracranial lesions.    # Chronic/minor medical Problems  AKI on CKD: Creatinine peaked at 2.46, returned to baseline by discharge  Severe Protein-Calorie Malnutrition in the context of chronic illness (03/01/21 0955)  Energy Intake: < or equal to 75% of estimated energy requirement for > or equal to 1 month  Fat Loss: Severe  Muscle Loss: Severe  Malnutrition Score: 3  Was started on Marinol during admission at [facility] without improvement. Stopped 2/2 cognitive side effects here. that admission however without improvement. Stopped 4/11 due to cognitive side effects. Was allergic to Mirtazapine and Megace contraindicated 2/2 cardiac hx. Ultimately opted for DC with hospice as above.    Anxiety:   Changed Celexa to 20mg  daily from QOD.   LLE Edema: stable, 2/2 saphenous vein harvesting from prior CABG. Dc'ed lasix in s/o hypotension.    CAD s/p CABG: aspirin, rosuvastatin   S/p bioprosthetic AVR: home ASA  Hx PUD, UGIB: protonix daily

## 2021-02-28 NOTE — Unmapped (Signed)
ORTHOPAEDIC SURGERY OPERATIVE NOTE (CSN: [id number])  Date of Surgery: 02/28/2021  Admit Date: 02/27/2021  Attending Physician: Lavenia Atlas, MD  Resident Physician(s): Dorthey Sawyer, MD    PREOPERATIVE DIAGNOSIS: Right pathologic intertrochanteric femur fracture     POSTOPERATIVE DIAGNOSIS:  Same     PROCEDURE:  Cephallomedullary nail fixation of right closed intertrochanteric femur fracture (CPT [id number]).     ANESTHESIA STAFF:  Anesthesiologist: Burnett Sheng, MD; Manon Hilding, MD  Anesthesia Provider: Norina Buzzard, CRNA    OR STAFF:  Surgeon(s) and Role:     * Ashley Mariner, MD MPH - Primary     * Dorthey Sawyer, MD - Resident - Assisting   Primary Circulator: Amy Julian Reil, RN  Physician Assistant: Lurline Hare, PA  Primary Scrub Person: Brand Males Cipperley, RN    IMPLANTS:  Implant Name Type Inv. Item Serial No. Manufacturer Lot No. LRB No. Used Action   NAIL GAMMA S [id number] RIGHT - [id number] Total Joints NAIL GAMMA S 10X420 RIGHT  [employer] [id number] Right 1 Implanted   SCREW LAG TI 10.5X100MM - [id number] Screws Pins Plates SCREW LAG TI 10.5X100MM  [employer] [id number] Right 1 Implanted   SCREW LOCKING T2 5X47. F/T - [id number] Screws Pins Plates SCREW LOCKING T2 5X47. F/T  [employer] [id number] Right 1 Implanted       ESTIMATED BLOOD LOSS: 200 mL     DRAINS:  None.     COMPLICATIONS:  None.     CONDITION/DISPOSITION:  Stable to PACU.     HISTORY: Douglas Gray is a 75 y.o. male who  has a past medical history of Anxiety, Arthritis, Cataract, CHF (congestive heart failure) (CMS-HCC), Chronic hypertension (02/25/2004), Chronic kidney disease, stage 3, mod decreased GFR (CMS-HCC), Cigarette smoker, COPD (chronic obstructive pulmonary disease) (CMS-HCC), Coronary artery disease (02/25/2004), Depression, Diabetes mellitus type II, controlled, with no complications (CMS-HCC) (11/10/2008), DVT (deep vein thrombosis) in pregnancy (06/28/2006), Heart murmur, Hemorrhage of gastrointestinal tract (01/12/2010), History of nephrolithiasis (2013), History of pneumonia, CABG, Hyperlipidemia LDL goal <70 (02/25/2004), Leg pain, Lymphedema (06/07/2009), Micturition frequency, Myocardial infarction (CMS-HCC), Obesity (04/22/2007), Peptic ulceration, Peripheral artery disease (CMS-HCC) (06/28/2006), Peripheral edema, S/P AVR, Sinus infection, Syncope and collapse, and Valvular heart disease.  He presented after 6 months of worsening right groin pain with ambulation. He also had a fall a few weeks ago, which worsened his right hip pain. He underwent XR and CT which demonstrated a greater trochanter fracture with intertrochanteric extension. He endorsed pain about the injured lower extremity but otherwise denied new onset numbness, tingling, parasthesias.    The risks and benefits of intramedullary nail fixation were explained to the patient who provided consent to proceed.  These included infection, nonunion, malunion, need for future surgery, and damage to surrounding anatomic structures.     DESCRIPTION OF OPERATIVE PROCEDURE:     Douglas Gray was identified in the preoperative holding area where the surgical site was marked with indelible ink. He was taken to the OR where a procedural timeout was called and the above noted anesthesia was induced.  He was moved onto the fracture table.  All bony prominences were well padded.  Prior to draping, the fracture was reduced using gentle traction, adduction, and internal rotation.  Fluoroscopy was used to verify acceptable reduction.  Preoperative antibiotics were dosed. The operative extremity was prepped and draped in the usual sterile fashion.  A second preoperative timeout was called verifying the correct patient, procedure, and  operative extremity.      A small incision through the skin, subcutaneous tissue and abductor fascia was made to insert the starting pin.  A guidepin was used to make a starting point just medial to the tip of the greater trochanter confirmed on AP and lateral views.  Using both AP and lateral fluoroscopy, the starting pin was placed into the proximal shaft of the femur. The entry reamer was placed over the guidewire with soft tissue protector. Reamings were sent for pathology.  The proximal canal was reamed to the level of the lesser trochanter.  After the entry reamer was used, we placed our long guidewire, using fluoroscopy to verify that it was in the canal. We reamed to 1.5 mm above the final nail diameter.  We then inserted our appropriate length cephallomedullary nail. Sequential AP radiographs were performed to verify the depth of insertion. The guidewire was removed.  Using the external jig, we then placed a guidewire into the neck of the femur. AP and lateral images verified center-center position within the femoral head. We then reamed the lateral cortex. We measured for the head/neck screw and inserted the appropriate sized implant into the femoral neck and head.  Distally we placed a single distal interlock screw from lateral to medial.  Final fluoroscopic images were taken verifying reduction of the fracture as well as appropriate placement of all hardware.  We then copiously irrigated all wounds.  The deep layers were closed with 0 PDS suture.  The wounds were closed with 2-0 PDS and staples.  We then applied a sterile dressing.  The patient was awoken from general anesthesia and taken to the PACU in stable condition without complication.     The patient was awoken from general anesthesia and taken to the PACU in stable condition without complication.     SPECIMENS:   Order Name Source Comment Collection Info Order Time   SURGICAL PATHOLOGY EXAM Leg, Right Pre-op diagnosis:  IT fracture Collected By: Ashley Mariner, MD MPH 02/28/2021  2:19 PM       POSTOPERATIVE PLAN:     Weightbearing status: WBAT    Antibiotics: Closed fracture: 24 hours ancef postoperatively    DVT prophylaxis: The patient will be placed on Lovenox subcutaneously for 40 mg daily x 30 days    Dressing: We will remove the dressing on postoperative day 3 to reexamine the wound.  After that point, the patient may change the dressing daily either at home or with assistance of nursing staff.    Sutures: SNF to  remove sutures and staples at approximately 2 weeks postoperatively    Follow up: Follow up at 6 weeks with XR to assess bony healing.

## 2021-02-28 NOTE — Unmapped (Signed)
ORTHOPAEDIC CONSULT  - Primary Service for this Patient: Emergency Medicine.    ASSESSMENT AND PLAN:  Douglas Gray is a 75 y.o. male with a history of MI, obesity, CAD s/p CABG, AVR, DVT, DM2, COPD, CKD3, CHF, PAD seen in consultation at the request of Smeet Achille Rich, DO for the evaluation of the following:      - Right pathologic greater trochanteric femur fracture with possible intertrochanteric extension  ?? 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 Pt with 6-26mo of progressive R hip pain, worse on standing/ambulation. Does report intermittent falls 3-4x/mo in setting of ongoing dizziness  ?? Difficult to fully assess extent of fracture on CT given significant number of metastatic lesions. Awaiting xrays (ordered) for further evaluation.   ?? Operative fixation may be indicated, pending further imaging and discussion with Trauma/Oncology teams. Please keep NPO at midnight 02/28/21 in case of possible OR  ?? Recommend medical admission to medicine/oncology for further workup of syncope, management of hypoxia/PTX (desatting to 80% during my examination), and medical optimization.    - Weight Bearing Status/Activity: nonweightbearing  on the right lower extremity.  - Recommended Additional Labs: No new labs needed.  - Pain control: per primary service and per ED.  - Tobacco use: None  - Best contact number: [phone] (home)    - Follow-up plan: TBD, Orthopaedics will arrange appropriate outpatient f/u on discharge    Consult was discussed with on call senior resident, Dr. Marijean Bravo.  Will be staffed with on-call attending and appropriate specialists as needed.    * Please contact the resident who leaves daily progress notes for any questions while patient is inpatient.  Page orthopaedic consult pager on nights (after 5PM) and weekends.     PROCEDURE(S)   none      SUBJECTIVE     Chief Complaint:  Right hip pain    History of Present Illness:   Douglas Gray is a 75 y.o. male with PMH as below including recently diagnosed stage IV prostate cancer on chemotherapy who presented to [facility] ED with hypotension and syncope. On arrival noted pain to R hip, with CT A/P showing possible R intertrochanteric hip fracture. Orthopaedics was then consulted for further management.     Pt states he has had ongoing pain to the right hip for the past 6-7 months. Describes aching pain primarily to anterior hip/groin, worsened with ambulation and long periods of standing. Has been able to ambulate with walker (which is his baseline). Does note intermittent falls 2-4x/mo, generally in the setting of dizziness he has had since he cancer diagnosis (10/2020). Last fall was 3 weeks ago, after which he was admitted at [facility] for PT/OT and ultimately discharge to SNF. Did have CT A/P during that admission which was reportedly negative for fx (though unable to visualize).     Pt reports general malaise and myalgias, but denies specific pain in other joints. Denies numbness/tingling in his extremities. Denies subjective fevers/chills. Denies other complaints.       Medical History   Past Medical History:   Diagnosis Date   ??? Anxiety    ??? Arthritis    ??? Cataract    ??? CHF (congestive heart failure) (CMS-HCC)    ??? Chronic hypertension 02/25/2004   ??? Chronic kidney disease, stage 3, mod decreased GFR (CMS-HCC)     Cindra Presume, MD   ??? Cigarette smoker    ??? COPD (chronic obstructive pulmonary disease) (CMS-HCC)    ???  Coronary artery disease 02/25/2004    Prior CABG and coronary stents.   ??? Depression    ??? Diabetes mellitus type II, controlled, with no complications (CMS-HCC) 11/10/2008   ??? DVT (deep vein thrombosis) in pregnancy 06/28/2006   ??? Heart murmur    ??? Hemorrhage of gastrointestinal tract 01/12/2010   ??? History of nephrolithiasis 2013    with urosepsis.   ??? History of pneumonia    ??? Hx of CABG    ??? Hyperlipidemia LDL goal <70 02/25/2004   ??? Leg pain    ??? Lymphedema 06/07/2009    Recommended rest, elevation, compression stockings.   ??? Micturition frequency    ??? Myocardial infarction (CMS-HCC)    ??? Obesity 04/22/2007   ??? Peptic ulceration    ??? Peripheral artery disease (CMS-HCC) 06/28/2006   ??? Peripheral edema    ??? 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 Sinus infection     admited to [facility] med   ??? Syncope and collapse    ??? Valvular heart disease       Surgical History   Past Surgical History:   Procedure Laterality Date   ??? AVR with CABG  05/08/2007    Aortic valve replacement with a 25 mm Edwards-magna pericardial valve and saphenous vein graft to the distal right coronary artery and posterior descending artery. Jerilee Hoh, MD.   ??? BARIATRIC SURGERY  2010   ??? CARDIAC CATHETERIZATION     ??? CARDIAC VALVE REPLACEMENT     ??? CHOLECYSTECTOMY     ??? CORONARY ARTERY BYPASS GRAFT     ??? Coronary stents - LCX      3 x 24 & 3.5 x 16 Taxus   ??? CYSTOSCOPY WITH LEFT URETEROSCOPY. LASER LITHOTRIPSY AND BASKET STONE EXTRACTION  05/09/2012    M. Isaac Bliss, MD   ??? EYE SURGERY     ??? HERNIA REPAIR     ??? PR CATH PLACE/CORON ANGIO, IMG SUPER/INTERP,W LEFT HEART VENTRICULOGRAPHY N/A 03/24/2020    Procedure: LEFT HEART CATHETERIZATION WITH POSSIBLE REVASCULARIZATION;  Surgeon: Lesle Chris, MD;  Location: [place] CATH;  Service: Cardiology   ??? PR COLONOSCOPY FLX DX W/COLLJ SPEC WHEN PFRMD N/A 11/28/2020    Procedure: COLONOSCOPY, FLEXIBLE, PROXIMAL TO SPLENIC FLEXURE; DIAGNOSTIC, W/WO COLLECTION SPECIMEN BY BRUSH OR WASH;  Surgeon: Beverly Milch, MD;  Location: [facility];  Service: Gastrointestinal   ??? PR INSERTION SUBQ CARDIAC RHYTHM MONITOR W/PRGRMG N/A 04/21/2020    Procedure: LOOP RECORDER IMPLANTATION;  Surgeon: Bettye Boeck, MD;  Location: [place] CATH;  Service: Cardiology   ??? PR UPPER GI ENDOSCOPY,BIOPSY N/A 11/28/2020    Procedure: UGI ENDOSCOPY; WITH BIOPSY, SINGLE OR MULTIPLE;  Surgeon: Beverly Milch, MD;  [facility]: [facility];  Service: Gastrointestinal   ??? PR UPPER GI ENDOSCOPY,DIAGNOSIS N/A 01/27/2021    Procedure: UGI ENDO, INCLUDE ESOPHAGUS, STOMACH, & DUODENUM &/OR JEJUNUM; DX W/WO COLLECTION SPECIMN, BY BRUSH OR WASH;  Surgeon: Annie Paras, MD;  Location: [facility];  Service: Gastroenterology   ??? PRIOR BILATERAL PATELLA SURGERY  1968 & 1973   ??? VAGOTOMY        Medications     Current Facility-Administered Medications:   ???  HYDROmorphone (PF) (DILAUDID) injection 2 mg, 2 mg, Intravenous, Once, Leo Grosser, MD    Current Outpatient Medications:   ???  acetaminophen (TYLENOL) 500 MG tablet, Take 1,000 mg by mouth every eight (8) hours as needed for  pain. , Disp: , Rfl:   ???  ascorbic acid, vitamin C, (VITAMIN C) 500 MG tablet, Take 500 mg by mouth daily., Disp: , Rfl:   ???  aspirin (ECOTRIN) 81 MG tablet, Take 81 mg by mouth daily., Disp: , Rfl:   ???  bisacodyL (DULCOLAX) 5 mg EC tablet, Take 10 mg by mouth daily as needed for constipation., Disp: , Rfl:   ???  citalopram (CELEXA) 20 MG tablet, Take 20 mg by mouth daily. , Disp: , Rfl:   ???  docusate sodium (COLACE) 100 MG capsule, Take 100 mg by mouth Two (2) times a day., Disp: , Rfl:   ???  dronabinoL (MARINOL) 5 MG capsule, Take 5 mg by mouth Two (2) times a day (30 minutes before a meal)., Disp: , Rfl:   ???  enzalutamide (XTANDI) 80 mg tablet, Take 2 tablets (160 mg total) by mouth daily. Swallow tablets whole. Do not cut, crush, or chew the tablets., Disp: 60 tablet, Rfl: 5  ???  ergocalciferol-1,250 mcg, 50,000 unit, (VITAMIN D2-1,250 MCG, 50,000 UNIT,) 1,250 mcg (50,000 unit) capsule, Take 1,250 mcg by mouth once a week., Disp: , Rfl:   ???  furosemide (LASIX) 40 MG tablet, Take 40 mg by mouth daily as needed. , Disp: , Rfl:   ???  lidocaine (LIDODERM) 5 % patch, Place 1 patch on the skin daily. Apply to affected area for 12 hours only each day (then remove patch) , Disp: , Rfl:   ???  lisinopriL (PRINIVIL,ZESTRIL) 40 MG tablet, Take 1 tablet by mouth daily. (Patient not taking: Reported on 02/27/2021), Disp: 30 tablet, Rfl: 11  ???  meclizine (ANTIVERT) 25 mg tablet, Chew 25 mg Three (3) times a day as needed., Disp: , Rfl:   ???  midodrine (PROAMATINE) 5 MG tablet, Take 5 mg by mouth Three (3) times a day., Disp: , Rfl:   ???  oxyCODONE (OXYCONTIN) 10 mg TR12 12 hr crush resistant ER/CR tablet, Take by mouth every twelve (12) hours., Disp: , Rfl:   ???  oxyCODONE (ROXICODONE) 5 MG immediate release tablet, Take 1 to 2 tablets by mouth every four hours as needed for pain., Disp: 120 tablet, Rfl: 0  ???  oxyCODONE myristate (XTAMPZA ER) 13.5 mg CSpT, Take by mouth two (2) times a day., Disp: 60 each, Rfl: 0  ???  polyethylene glycol (MIRALAX) 17 gram packet, Take 17 g by mouth daily., Disp: , Rfl:   ???  polysaccharide iron complex (NIFEREX) 150 mg iron capsule, Take 150 mg by mouth daily., Disp: , Rfl:   ???  relugolix (ORGOVYX) 120 mg tablet, Take 1 tablet (120 mg total) by mouth daily. Take with or without food. Swallow tablets whole. Do not crush or chew tablet., Disp: 30 tablet, Rfl: 5  ???  rosuvastatin (CRESTOR) 5 MG tablet, Take 1 tablet (5 mg total) by mouth nightly., Disp: 30 tablet, Rfl: 11  ???  senna (SENOKOT) 8.6 mg tablet, Take 1 tablet by mouth daily., Disp: , Rfl:   ???  traZODone (DESYREL) 50 MG tablet, Take 150 mg by mouth nightly. , Disp: , Rfl: 2    Facility-Administered Medications Ordered in Other Encounters:   ???  degarelix (FIRMAGON) injection 80 mg, 80 mg, Subcutaneous, Once, Evalina Field, MD   Allergies   Allergies   Allergen Reactions   ??? Metformin Swelling   ??? Mirtazapine Anxiety     Memory confusion   ??? Janumet [Sitagliptin-Metformin]    ??? Indomethacin Anxiety     (  INDOCIN)        ??   Social History Tobacco use:  reports that he has quit smoking. His smoking use included cigarettes. He has a 27.50 pack-year smoking history. He has never used smokeless tobacco.  Alcohol use:  reports no history of alcohol use..  Drug use:  reports no history of drug use.  Employment: retired.  Prior ambulatory status: Household ambulator with assistive device. (walker)   ??   Family History Family History   Problem Relation Age of Onset   ??? Heart disease Mother    ??? Stroke Mother    ??? Cancer Father    ??? Depression Father    ??? Heart disease Father    ??? Breast cancer Sister    ??? Heart attack Brother    ??? Heart attack Brother    ??? Heart attack Brother         ??     Review of Systems Musculoskeletal: per HPI  - - - - - - - - - - -  Neurologic: negative for numbness/tingling  - - - - - - - - - - -  Constitutional: negative for fevers   - - - - - - - - - - -  Cardiovascular: negative for chest pain  Eyes: negative for eye pain  Respiratory: negative for wheezing  Gastrointestinal: negative for nausea  Skin: negative for rash  Hematologic: negative for easy bruising  ENT: negative for headache       OBJECTIVE     Constitutional   Vitals  Estimated body mass index is 23.01 kg/m?? as calculated from the following:    Height as of an earlier encounter on 02/27/21: 180.3 cm (5' 11).    Weight as of 01/27/21: 74.8 kg (165 lb).   Vitals:    02/27/21 2100   BP: 127/56   Pulse: 62   Resp: 12   Temp:    SpO2: 97%        General appearance  Elderly male, in no acute distress   Psychiatric Orientation: Based on my interaction with the patient, I determined that he is appropriately oriented.  Mood and Affect: alert, cooperative and pleasant   Respiratory Respiratory effort non-labored, though with frequent desaturations to 80s on my evaluation   Genitourinary not applicable.   Hematologic /lymphatic trauma: normal bruising or hematoma, consistent with level of trauma.   Cardiovascular See vascular (pulses) examination under extremities.   Neurologic See specific peripheral nerve exam under extremities.   Skin See lacerations or skin injuries under extremities.   Musculoskeletal   Extremities:  Right Upper Extremity: No swelling, ecchymoses, deformity, or effusion. Skin intact. No tenting, impending open fracture. Nontender to palpation, with full and painless ROM throughout. Intact motor in deltoid, EPL, FPL, 2nd finger FDP, and interossei muscles. SILT in axillary, median, radial, and ulnar distributions.  Two-point discrimination deferred.  2+ radial pulse with warm and well perfused digits. Compartments soft and compressible with no pain on distracted passive flexion/extension of fingers.    Left Upper Extremity: No swelling, ecchymoses, deformity, or effusion. Skin intact. No tenting, impending open fracture. Nontender to palpation, with full and painless ROM throughout. Intact motor in deltoid, EPL, FPL, 2nd finger FDP, and interossei muscles. SILT in axillary, median, radial, and ulnar distributions.  Two-point discrimination deferred.  2+ radial pulse with warm and well perfused digits. Compartments soft and compressible with no pain on distracted passive flexion/extension of fingers.    Right Lower Extremity: Mild  diffuse tenderness to palpation about hip. Is able to actively flex the hip to 90 degrees, no significant pain with external rotation though significant pain with gentle internal rotation. Also with mild tenderness over the dorsum of the right midfoot.  Remainder of extremity without swelling, ecchymoses, deformity, or effusion.  Otherwise full and painless passive ROM throughout. Stable ligamentous exam.  Intact ankle plantarflexion, dorsiflexion, and EHL. SILT over dorsal 1st web space, dorsal/plantar aspect of forefoot, lateral malleolus, and medial malleolus.  2+ DP pulse with warm and well perfused toes. Leg and thigh compartments soft and compressible, with no pain on distracted passive flexion/extension of big toe.      Left Lower Extremity: No swelling, ecchymoses, deformity, or effusion. Skin intact.  No tenting, impending open fracture. Moderately tender to palpation to the left thigh, mildly tender to L ankle primarily over lateral aspect of ankle. Able to range extremity full at all joints. Otherwise nontender to palpation, with full and painless ROM throughout.  Stable ligamentous exam.  Intact ankle plantarflexion, dorsiflexion, and EHL. SILT over dorsal 1st web space, dorsal/plantar aspect of forefoot, lateral malleolus, and medial malleolus.  2+ DP pulse with warm and well perfused toes. Leg and thigh compartments soft and compressible, with no pain on distracted passive flexion/extension of big toe.           Test Results  Imaging  Radiology studies were personally reviewed.  CT A/P with diffuse osseous metastases throughout the pelvis and b/l proximal femurs. Fracture noted through the greater trochanter with concern for possible intertrochanteric extension  B/l trauma hip radiographs pending  XR of the R foot with no fx or dislocation  XR of the L ankle with no fx or dislocation    Labs  Lab Results   Component Value Date    WBC 6.9 02/27/2021    HGB 13.1 02/27/2021    HCT 39.3 02/27/2021    PLT 272 02/27/2021      No results in the last day   Lab Results   Component Value Date    NA 142 02/27/2021    K 4.0 02/27/2021    GLU 106 02/27/2021     Lab Results   Component Value Date    PT 13.0 12/01/2020    INR 1.11 12/01/2020    APTT 29.0 03/24/2020     No results found for: ESR, CRP  No results found for: ALB  @MALNUTRITION     Comorbidities  See primary team documentation, or consult notes from medical or trauma services.    Patient Active Problem List   Diagnosis   ??? Coronary artery disease involving native coronary artery of native heart without angina pectoris   ??? Valvular heart disease   ??? Nonrheumatic mitral valve regurgitation   ??? Nonrheumatic tricuspid valve regurgitation   ??? Status post coronary artery stent placement   ??? Hx of CABG   ??? Status post aortic valve replacement with bioprosthetic valve   ??? PVD (peripheral vascular disease) (CMS-HCC)   ??? Hyperlipidemia LDL goal <70   ??? Bilateral carotid artery stenosis   ??? Type 2 diabetes mellitus with stage 3 chronic kidney disease, without long-term current use of insulin (CMS-HCC)   ??? Stage 3 chronic kidney disease (CMS-HCC)   ??? Cigarette smoker   ??? Personal history of nicotine dependence   ??? Syncope   ??? Unstable balance   ??? Discomfort of back   ??? Intermittent palpitations   ??? Paroxysmal atrial fibrillation (CMS-HCC)   ???  Wide-complex tachycardia (CMS-HCC)   ??? Abnormal nuclear stress test   ??? Bradycardia   ??? Dyspnea on effort   ??? Hx of falling   ??? Gastrointestinal hemorrhage with melena   ??? Bone metastases (CMS-HCC)   ??? Elevated PSA   ??? 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 Age-related osteoporosis without current pathological fracture       Note created by Laural Roes, February 27, 2021 9:44 PM

## 2021-02-28 NOTE — Unmapped (Signed)
Due to inpatient's cardiac device:    Ordering MD needs to place an order for ICD/Pacemaker Evaluation Arboriculturist), code [phone] for EP Services  [phone])  and, only if outside of normal clinic hours, contact Cardiology Fellow for consult to evaluate if MRI exam can be performed.      In addition to the Interrogation of the Patient's Device, if device is a MRI Non-Conditional, in order to follow Safety Protocol, we need the following placed as a note in EPIC:           I, ___________________________(referring physician's name),     am ordering a _______________________________MRI for     ______________________________(patient name and MRN).     I have determined that this MRI is clinically-indicated without acceptable alternative imaging techniques available and the results of this MRI will impact patient care.  I have discussed this with the patient and the patient agrees to undergo the MRI.       Thank You,    MRI Technologist    Please feel free to call the MRI Department [phone] or MRI Supervisors, Clif Stallings or [employer], with questions.

## 2021-02-28 NOTE — Unmapped (Incomplete)
Orthopaedics Progress Note    Assessment and Plan:  75 y.o. male s/p CMN fixation of right pathologic intertrochanteric femur fracture 02/28/21    ??? Okay from orthopaedic perspective to consider palliative radiation to proximal femur and pelvic metastasis  ??? DVT Prophylaxis: Lovenox 40 mg daily, SCDs, ambulation.  ??? Weightbearing as tolerated.  ??? Multimodal Pain control. Transition off narcotic and IV medications as tolerated.  ??? Antibiotics: surgical prophylaxis indicated for 23 hrs postoperatively.  ??? Dressing to be changed POD3   by MD then PRN by nursing.   ??? Therapy: PT/OT  ??? Disposition: Per primary. Okay to discharge from orthopaedic standpoint. We will arrange follow-up after discharge.     Subjective  {kesPNsub:[id number]}    Objective  Vitals: Blood pressure 120/55, pulse 62, temperature 36.5 ??C, temperature source Temporal, resp. rate 18, SpO2 94 %.  General Appearance: Awake and alert   MSK exam:(RLE):    Cardiovascular:   Palpable DP pulse.    Neuro/Compartments:   RLE: +motor in GS/TA/EHL. SILT in superficial/deep peroneal, saphenous, sural and tibial nerve distributions. Compartments are soft and compressible. No pain with passive stretch of toes.    ?? Dressing/Splint is clean, dry and intact     Diagnostic Studies    Radiology/Cultures:  No new imaging to review     Relevant Labs:  Recent Results (from the past 24 hour(s))   ECG 12 Lead    Collection Time: 02/27/21  3:00 PM   Result Value Ref Range    EKG Systolic BP  mmHg    EKG Diastolic BP  mmHg    EKG Ventricular Rate 58 BPM    EKG Atrial Rate 58 BPM    EKG P-R Interval 176 ms    EKG QRS Duration 84 ms    EKG Q-T Interval 480 ms    EKG QTC Calculation 471 ms    EKG Calculated P Axis  degrees    EKG Calculated R Axis 30 degrees    EKG Calculated T Axis -148 degrees    QTC Fredericia 474 ms   Comprehensive Metabolic Panel    Collection Time: 02/27/21  3:14 PM   Result Value Ref Range    Sodium 142 135 - 145 mmol/L    Potassium 4.0 3.4 - 4.8 mmol/L Chloride 109 (H) 98 - 107 mmol/L    Anion Gap 7 5 - 14 mmol/L    CO2 26.0 20.0 - 31.0 mmol/L    BUN 51 (H) 9 - 23 mg/dL    Creatinine 1.61 (H) 0.60 - 1.10 mg/dL    BUN/Creatinine Ratio 33     EGFR CKD-EPI Non-African American, Male 43 (L) >=60 mL/min/1.69m2    EGFR CKD-EPI African American, Male 49 (L) >=60 mL/min/1.31m2    Glucose 106 70 - 179 mg/dL    Calcium 8.4 (L) 8.7 - 10.4 mg/dL    Albumin 2.2 (L) 3.4 - 5.0 g/dL    Total Protein 5.2 (L) 5.7 - 8.2 g/dL    Total Bilirubin 0.8 0.3 - 1.2 mg/dL    AST 12 <=09 U/L    ALT 9 (L) 10 - 49 U/L    Alkaline Phosphatase 891 (H) 46 - 116 U/L   Lipase Level    Collection Time: 02/27/21  3:14 PM   Result Value Ref Range    Lipase 17 12 - 53 U/L   hsTroponin I (serial 0-2-6H w/ delta)    Collection Time: 02/27/21  3:14 PM   Result Value Ref Range  hsTroponin I 19 <=53 ng/L   CBC w/ Differential    Collection Time: 02/27/21  3:14 PM   Result Value Ref Range    WBC 6.9 3.6 - 11.2 10*9/L    RBC 4.44 4.26 - 5.60 10*12/L    HGB 13.1 12.9 - 16.5 g/dL    HCT 40.9 81.1 - 91.4 %    MCV 88.6 77.6 - 95.7 fL    MCH 29.4 25.9 - 32.4 pg    MCHC 33.2 32.0 - 36.0 g/dL    RDW 78.2 (H) 95.6 - 15.2 %    MPV 7.3 6.8 - 10.7 fL    Platelet 272 150 - 450 10*9/L    Neutrophils % 80.0 %    Lymphocytes % 13.0 %    Monocytes % 4.6 %    Eosinophils % 1.5 %    Basophils % 0.9 %    Absolute Neutrophils 5.5 1.8 - 7.8 10*9/L    Absolute Lymphocytes 0.9 (L) 1.1 - 3.6 10*9/L    Absolute Monocytes 0.3 0.3 - 0.8 10*9/L    Absolute Eosinophils 0.1 0.0 - 0.5 10*9/L    Absolute Basophils 0.1 0.0 - 0.1 10*9/L    Anisocytosis Slight (A) Not Present   Lactic Acid, Venous, Whole Blood    Collection Time: 02/27/21  3:22 PM   Result Value Ref Range    Lactate, Venous 2.0 (H) 0.5 - 1.8 mmol/L   Blood Gas, Venous    Collection Time: 02/27/21  3:22 PM   Result Value Ref Range    Specimen Source Venous     FIO2 Venous 28%     pH, Venous 7.36 7.32 - 7.43    pCO2, Ven 46 40 - 60 mm Hg    pO2, Ven <20 (L) 30 - 55 mm Hg HCO3, Ven 26 22 - 27 mmol/L    Base Excess, Ven 1.0 -2.0 - 2.0    O2 Saturation, Venous 13.0 (L) 40.0 - 85.0 %   Urinalysis with Culture Reflex    Collection Time: 02/27/21  4:25 PM    Specimen: Clean Catch; Urine   Result Value Ref Range    Color, UA Yellow     Clarity, UA Clear     Specific Gravity, UA 1.015 1.003 - 1.030    pH, UA 5.0 5.0 - 9.0    Leukocyte Esterase, UA Negative Negative    Nitrite, UA Negative Negative    Protein, UA Negative Negative    Glucose, UA Negative Negative    Ketones, UA Negative Negative    Urobilinogen, UA 0.2 mg/dL     Bilirubin, UA Negative Negative    Blood, UA Negative Negative    RBC, UA 1 <=3 /HPF    WBC, UA 1 <=2 /HPF    Squam Epithel, UA <1 0 - 5 /HPF    Bacteria, UA None Seen None Seen /HPF    Hyaline Casts, UA 6 (H) 0 - 1 /LPF   hsTroponin I - 2 Hour    Collection Time: 02/27/21  5:02 PM   Result Value Ref Range    hsTroponin I 17 <=53 ng/L    delta hsTroponin I 2 <=7 ng/L   hsTroponin I - 6 Hour    Collection Time: 02/27/21 10:09 PM   Result Value Ref Range    hsTroponin I 21 <=53 ng/L    delta hsTroponin I 4 <=7 ng/L   COVID-19 PCR    Collection Time: 02/27/21 10:09 PM    Specimen: Nasopharyngeal  Swab   Result Value Ref Range    SARS-CoV-2 PCR Negative Negative   Hepatic Function Panel    Collection Time: 02/28/21  3:22 AM   Result Value Ref Range    Albumin 2.0 (L) 3.4 - 5.0 g/dL    Total Protein 4.5 (L) 5.7 - 8.2 g/dL    Total Bilirubin 1.0 0.3 - 1.2 mg/dL    Bilirubin, Direct 1.61 (H) 0.00 - 0.30 mg/dL    AST 12 <=09 U/L    ALT 7 (L) 10 - 49 U/L    Alkaline Phosphatase 779 (H) 46 - 116 U/L   Magnesium Level    Collection Time: 02/28/21  3:22 AM   Result Value Ref Range    Magnesium 1.5 (L) 1.6 - 2.6 mg/dL   Phosphorus Level    Collection Time: 02/28/21  3:22 AM   Result Value Ref Range    Phosphorus 3.8 2.4 - 5.1 mg/dL   Type and Screen    Collection Time: 02/28/21  3:22 AM   Result Value Ref Range    ABO Grouping A POS     Antibody Screen NEG    PT-INR    Collection Time: 02/28/21  3:22 AM   Result Value Ref Range    PT 13.2 10.3 - 13.4 sec    INR 1.13    CBC w/ Differential    Collection Time: 02/28/21  3:22 AM   Result Value Ref Range    WBC 8.0 3.6 - 11.2 10*9/L    RBC 3.95 (L) 4.26 - 5.60 10*12/L    HGB 11.6 (L) 12.9 - 16.5 g/dL    HCT 60.4 (L) 54.0 - 48.0 %    MCV 88.1 77.6 - 95.7 fL    MCH 29.4 25.9 - 32.4 pg    MCHC 33.4 32.0 - 36.0 g/dL    RDW 98.1 (H) 19.1 - 15.2 %    MPV 7.8 6.8 - 10.7 fL    Platelet 263 150 - 450 10*9/L    Neutrophils % 72.7 %    Lymphocytes % 18.1 %    Monocytes % 6.6 %    Eosinophils % 1.5 %    Basophils % 1.1 %    Absolute Neutrophils 5.8 1.8 - 7.8 10*9/L    Absolute Lymphocytes 1.4 1.1 - 3.6 10*9/L    Absolute Monocytes 0.5 0.3 - 0.8 10*9/L    Absolute Eosinophils 0.1 0.0 - 0.5 10*9/L    Absolute Basophils 0.1 0.0 - 0.1 10*9/L    Anisocytosis Slight (A) Not Present   Basic Metabolic Panel    Collection Time: 02/28/21  3:22 AM   Result Value Ref Range    Sodium 140 135 - 145 mmol/L    Potassium 3.9 3.4 - 4.8 mmol/L    Chloride 108 (H) 98 - 107 mmol/L    CO2 23.0 20.0 - 31.0 mmol/L    Anion Gap 9 5 - 14 mmol/L    BUN 42 (H) 9 - 23 mg/dL    Creatinine 4.78 (H) 0.60 - 1.10 mg/dL    BUN/Creatinine Ratio 30     EGFR CKD-EPI Non-African American, Male 48 (L) >=60 mL/min/1.15m2    EGFR CKD-EPI African American, Male 56 (L) >=60 mL/min/1.24m2    Glucose 86 70 - 179 mg/dL    Calcium 8.4 (L) 8.7 - 10.4 mg/dL       Contact  Please contact Clement Sayres, APP [phone]) 6am-3pm Tuesday-Friday  On nights (6pm-6am), weekends, and holidays, please page the Consult  pager.  On Mondays please contact the person who leaves daily progress notes.

## 2021-02-28 NOTE — Unmapped (Signed)
Oncology ([facility]) History & Physical    Assessment & Plan:   Douglas Gray is a 75 y.o. 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 who presented from his SNF after a fall and found to have pathologic acute R displaced rib fractures associated with small pneumothorax and right pathologic greater trochanteric femur fracture    Active Problems:    Nonrheumatic mitral valve regurgitation    Hx of CABG    Status post aortic valve replacement with bioprosthetic valve    PVD (peripheral vascular disease) (CMS-HCC)    Stage 3 chronic kidney disease (CMS-HCC)    Hx of falling    Bone metastases (CMS-HCC)    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 *      Right pathologic greater trochanteric femur fracture with possible intertrochanteric extension: History of progressive R hip pain over the last 6-7 months. Found to have a R femur fracture on admission, reportedly new from 3/25 R femur XR completed at [facility] Difficult for orthopedics to fully assess extent of fracture on CT given significant number of metastatic lesions in the pelvis and recommended XRs for evaluation of operative candidacy.   --Ortho considering operative fixation  --NPO at midnight 02/28/21 in case of possible OR  --Ortho consulted, appreciate continued assistance   --Pain control: home oxycontin 10mg  BID, oxycodone prn, IV dilaudid 1mg  for breakthrough     Pathologic Right Rib Fractures I Small Pneumothorax   Recent admission to [facility] for R 7th and 8th rib fractures with associated pneumothorax (3/16-3/25). Imaging on admission includes additional acute right rib displaced fractures now including at least the 6-10th posterior ribs with small associated pneumothorax and small volume pleural effusion the right hemithorax. 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 Now on ADT s/p degarelix 11/2020 and will changed to relugolix 01/17/2021 and enzalutamide 02/01/21  (per ENZAMET and ARCHES).   --notify Dr. Okey Dupre of admission   --discuss continuation of relugolix and enzalutamide (patient reports his wife can retreive from SNF)   --home Vit D/Calcium supplement      AKI CKD IIIb: Baseline Cr 1.2-1.4. Slightly elevated above baseline at 1.56. Expect pre-renal and should improve after fluids.   -trend   -avoid nephrotoxic medications     BLE Edema: negative dopplers during admission to [facility] on 3/25. Worse in left ankle, XR negative for fracture. Hold daily lasix 40mg  daily in setting of hypotension.     Poor appetite:   -continue Marinol   -nutrition consult     Coronary disease status post CABG: aspirin, rosuvastatin     Orthostatic Hypotension: continue midodrine 5mg  TID. Stated on admission to [facility] with improvement in orthostatic symptoms.     Hx PUD: protonix daily     Anxiety: continue citalopram and Wellbutrin      Daily Checklist:  Diet: NPO and Regular Diet  DVT PPx: Held for OR   Electrolytes: Replete Potassium to >/=4 and Magnesium to >/=2  Code Status: Full Code  Chief Concern:   No Principal Problem: There is no principal problem currently on the Problem List. Please update the Problem List and refresh.    Subjective:   HPI:  Douglas Gray is a 75 y.o. 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     Patient was recently admitted to [facility] health 3 weeks ago with right rib fractures with associated small asymptomatic pneumothorax after a fall.  He was treated conservatively and discharged to SNF.  He now presents from [facility] with a recurrent fall.  He states that he fell upon attempting to stand.  He has been ambulating with a walker which is his baseline.  He reports that he has been having issues with orthostatic changes.  He has been attempting to sit on the bed for several minutes before standing to help improve these.  Unfortunately yesterday he fell backwards hit his head without loss of consciousness.  He reports he felt a pop in his right thigh.  He presented to the ED and was found to have new right femur fracture on CT A&P.  Additionally had numerous acute rib fractures (appears to be an increased in number from his previous admission) associated with recurrent pneumothorax.  His pain is well controlled upon my exam and he denies shortness of breath or chest pain.  He was on room air satting in the mid 90s.  Orthopedics evaluated him in the ED and will follow up on x-rays to determine the extent of the fracture in order to evaluate candidacy for fixation of his right femur fracture.    Designated Environmental health practitioner:  Douglas Gray currently has decisional capacity for healthcare decision-making and is able to designate a surrogate healthcare decision maker. Douglas Gray designated healthcare decision maker(s) is/are Douglas Gray (the patient's spouse) as denoted by stated patient preference.    Allergies:  Metformin, Mirtazapine, Janumet [sitagliptin-metformin], and Indomethacin    Medications:   Prior to Admission medications    Medication Dose, Route, Frequency   acetaminophen (TYLENOL) 500 MG tablet 1,000 mg, Oral, Every 8 hours PRN   ascorbic acid, vitamin C, (VITAMIN C) 500 MG tablet 500 mg, Oral, Daily (standard)   aspirin (ECOTRIN) 81 MG tablet 81 mg, Oral, Daily (standard)   bisacodyL (DULCOLAX) 5 mg EC tablet 10 mg, Oral, Daily PRN   citalopram (CELEXA) 20 MG tablet 20 mg, Oral, Daily (standard)   docusate sodium (COLACE) 100 MG capsule 100 mg, Oral, 2 times a day (standard)   dronabinoL (MARINOL) 5 MG capsule 5 mg, Oral, 2 times a day (AC)   enzalutamide (XTANDI) 80 mg tablet 160 mg, Oral, Daily (standard), Swallow tablets whole. Do not cut, crush, or chew the tablets.   ergocalciferol-1,250 mcg, 50,000 unit, (VITAMIN D2-1,250 MCG, 50,000 UNIT,) 1,250 mcg (50,000 unit) capsule 1,250 mcg, Oral, Weekly   furosemide (LASIX) 40 MG tablet 40 mg, Oral, Daily PRN   lidocaine (LIDODERM) 5 % patch 1 patch, Transdermal, Every 24 hours, Apply to affected area for 12 hours only each day (then remove patch)   lisinopriL (PRINIVIL,ZESTRIL) 40 MG tablet Take 1 tablet by mouth daily.  Patient not taking: Reported on 02/27/2021   meclizine (ANTIVERT) 25 mg tablet 25 mg, Oral, 3 times a  day PRN   midodrine (PROAMATINE) 5 MG tablet 5 mg, Oral, 3 times a day (standard)   oxyCODONE (OXYCONTIN) 10 mg TR12 12 hr crush resistant ER/CR tablet Oral, Every 12 hours   oxyCODONE (ROXICODONE) 5 MG immediate release tablet Take 1 to 2 tablets by mouth every four hours as needed for pain.   oxyCODONE myristate (XTAMPZA ER) 13.5 mg CSpT Oral, 2 times a day   polyethylene glycol (MIRALAX) 17 gram packet 17 g, Oral, Daily (standard)   polysaccharide iron complex (NIFEREX) 150 mg iron capsule 150 mg, Oral, Daily   relugolix (ORGOVYX) 120 mg tablet 120 mg, Oral, Daily (standard), Take with or without food. Swallow tablets whole. Do not crush or chew tablet.   rosuvastatin (CRESTOR) 5 MG tablet 5 mg, Oral, Nightly   senna (SENOKOT) 8.6 mg tablet 1 tablet, Oral, Daily (standard)   traZODone (DESYREL) 50 MG tablet 150 mg, Oral, Nightly       Medical History:  Past Medical History:   Diagnosis Date   ??? Anxiety    ??? Arthritis    ??? Cataract    ??? CHF (congestive heart failure) (CMS-HCC)    ??? Chronic hypertension 02/25/2004   ??? Chronic kidney disease, stage 3, mod decreased GFR (CMS-HCC)     Cindra Presume, MD   ??? Cigarette smoker    ??? COPD (chronic obstructive pulmonary disease) (CMS-HCC)    ??? Coronary artery disease 02/25/2004    Prior CABG and coronary stents.   ??? Depression    ??? Diabetes mellitus type II, controlled, with no complications (CMS-HCC) 11/10/2008   ??? DVT (deep vein thrombosis) in pregnancy 06/28/2006   ??? Heart murmur    ??? Hemorrhage of gastrointestinal tract 01/12/2010   ??? History of nephrolithiasis 2013    with urosepsis.   ??? History of pneumonia    ??? Hx of CABG    ??? Hyperlipidemia LDL goal <70 02/25/2004   ??? Leg pain    ??? Lymphedema 06/07/2009    Recommended rest, elevation, compression stockings.   ??? Micturition frequency    ??? Myocardial infarction (CMS-HCC)    ??? Obesity 04/22/2007   ??? Peptic ulceration    ??? Peripheral artery disease (CMS-HCC) 06/28/2006   ??? Peripheral edema    ??? 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 Sinus infection     admited to [facility] med   ??? Syncope and collapse    ??? Valvular heart disease        Surgical History:  Past Surgical History:   Procedure Laterality Date   ??? AVR with CABG  05/08/2007    Aortic valve replacement with a 25 mm Edwards-magna pericardial valve and saphenous vein graft to the distal right coronary artery and posterior descending artery. Jerilee Hoh, MD.   ??? BARIATRIC SURGERY  2010   ??? CARDIAC CATHETERIZATION     ??? CARDIAC VALVE REPLACEMENT     ??? CHOLECYSTECTOMY     ??? CORONARY ARTERY BYPASS GRAFT     ??? Coronary stents - LCX      3 x 24 & 3.5 x 16 Taxus   ??? CYSTOSCOPY WITH LEFT URETEROSCOPY. LASER LITHOTRIPSY AND BASKET STONE EXTRACTION  05/09/2012    M. Isaac Bliss, MD   ??? EYE SURGERY     ??? HERNIA REPAIR     ??? PR CATH PLACE/CORON ANGIO, IMG SUPER/INTERP,W LEFT HEART VENTRICULOGRAPHY N/A 03/24/2020    Procedure: LEFT HEART CATHETERIZATION WITH POSSIBLE REVASCULARIZATION;  Surgeon:  Lesle Chris, MD;  Location: [place] CATH;  Service: Cardiology   ??? PR COLONOSCOPY FLX DX W/COLLJ SPEC WHEN PFRMD N/A 11/28/2020    Procedure: COLONOSCOPY, FLEXIBLE, PROXIMAL TO SPLENIC FLEXURE; DIAGNOSTIC, W/WO COLLECTION SPECIMEN BY BRUSH OR WASH;  Surgeon: Beverly Milch, MD;  Location: [facility];  Service: Gastrointestinal   ??? PR INSERTION SUBQ CARDIAC RHYTHM MONITOR W/PRGRMG N/A 04/21/2020    Procedure: LOOP RECORDER IMPLANTATION;  Surgeon: Bettye Boeck, MD;  Location: [place] CATH;  Service: Cardiology   ??? PR UPPER GI ENDOSCOPY,BIOPSY N/A 11/28/2020    Procedure: UGI ENDOSCOPY; WITH BIOPSY, SINGLE OR MULTIPLE;  Surgeon: Beverly Milch, MD;  [facility]: [facility];  Service: Gastrointestinal   ??? PR UPPER GI ENDOSCOPY,DIAGNOSIS N/A 01/27/2021    Procedure: UGI ENDO, INCLUDE ESOPHAGUS, STOMACH, & DUODENUM &/OR JEJUNUM; DX W/WO COLLECTION SPECIMN, BY BRUSH OR WASH;  Surgeon: Annie Paras, MD;  [facility]: [facility];  Service: Gastroenterology   ??? PRIOR BILATERAL PATELLA SURGERY  1968 & 1973   ??? VAGOTOMY         Family History:    Family History   Problem Relation Age of Onset   ??? Heart disease Mother    ??? Stroke Mother    ??? Cancer Father    ??? Depression Father    ??? Heart disease Father    ??? Breast cancer Sister    ??? Heart attack Brother    ??? Heart attack Brother    ??? Heart attack Brother        Social History:  The patient lives in a skilled nursing facility    Social History     Tobacco Use   ??? Smoking status: Former Smoker     Packs/day: 0.50     Years: 55.00     Pack years: 27.50     Types: Cigarettes   ??? Smokeless tobacco: Never Used   ??? Tobacco comment: Quit 5 weeks ago   Vaping Use   ??? Vaping Use: Never used   Substance Use Topics   ??? Alcohol use: Never   ??? Drug use: Never        Review of Systems:  10 systems were reviewed and are negative unless otherwise mentioned in the HPI    Objective:   Physical Exam:  Temp:  [36.5 ??C-36.8 ??C] 36.8 ??C  Heart Rate:  [54-81] 68  SpO2 Pulse:  [52-77] 65  Resp:  [9-18] 11  BP: (81-135)/(44-76) 127/57  SpO2:  [94 %-100 %] 95 %    Gen:  NAD, answers questions appropriately  Eyes: Sclera anicteric, EOMI, PERRLA,  HENT: atraumatic, normocephalic, DMM  Heart: RRR, S1, S2, systolic ejection murmur   Lungs: decreased breath sounds on right mid to lower lung, no crackles or wheezes, no use of accessory muscles  Abdomen: soft, NTND, no rebound/guarding  Extremities: no clubbing, cyanosis, +2 edema on left ankle   Neuro: CN II-XI grossly intact, normal cerebellar function. No focal deficits.  MSK: tenderness to right hip and thigh, full ROM   Skin:  No rashes, lesions on clothed exam  Psych: Alert, oriented, normal mood and affect.     Labs/Studies/Imaging:  Labs, Studies, Imaging from the last 24hrs per EMR and personally reviewed

## 2021-02-28 NOTE — Unmapped (Signed)
Patient presented today for scheduled clinic visit - found to be in severe pain, unable to walk, with BP 80/40 (including manual recheck) with associated dizziness and confusion. Recommended ED evaluation urgently and full clinic visit not completed.    Of note, he recently initiated prostate cancer treatment and is on androgen deprivation with daily relugolix, and ADT should be continued (could switch to subQ/IM ADT if logistically needed). Enzalutamide is being held currently. His PSA has responded nicely and do not think presentation is due to cancer progression. Douglas Gray could have contributed to initial fall.    (also of note, his somatic sequencing showed a BRCA2 mutation. I was not able to discuss this with him today but needs referral to genetics for germline testing and PARP inhibitors would be good treatment options for him in the future)

## 2021-02-28 NOTE — Unmapped (Cosign Needed)
ORTHOPAEDIC PROGRESS NOTE      - Primary Service for this Patient: Oncology/Hematology (MDE).    ASSESSMENT & PLAN:  75 y.o. 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 Please contact the resident who leaves daily progress notes for any questions after 3pm or Page orthopaedic consult pager [phone]) on nights (after 5PM) and weekends.     SUBJECTIVE:  Patient reports increasing right anterior groin pain over the last 6 months or so. It recently increased when he had a fall 2 weeks ago. He has been ambulatory since then.    OBJECTIVE:  PE:  BP 107/65  - Pulse 68  - Temp 36.8 ??C (Oral)  - Resp 9  - SpO2 94%     Vitals:Patient Vitals for the past 8 hrs:   BP Temp Temp src Pulse SpO2 Pulse Resp SpO2   02/28/21 0500 107/65 ??? ??? 68 ??? 9 94 %   02/28/21 0200 127/57 36.8 ??C Oral 68 65 11 95 %   02/28/21 0105 126/51 ??? ??? 75 ??? 9 96 %   02/27/21 2303 103/65 ??? ??? 77 77 12 94 %     General:well-nourished and no acute distress    RLE:  No swelling, deformity, or effusion. Skin intact. Able to actively flex hip to 90 degrees with minimal pain. He had some mild pain with internal rotation of hip.     + GS/TA/EHL. SILT in DP/SP/S/S/P distributions. 2+ DP pulse with warm and well perfused toes.  Compartments soft and compressible, with no pain on passive stretch.    ...........................................................................................................................................Marland Kitchen    LLE: No swelling, deformity, or effusion. Skin intact. Nontender to palpation, with full and painless ROM throughout.     + GS/TA/EHL. SILT in DP/SP/S/S/P distributions. 2+ DP pulse with warm and well perfused toes. Ligamentous exam stable. Compartments soft and compressible, with no pain on passive stretch.      Labs  Lab Results   Component Value Date    WBC 8.0 02/28/2021    HGB 11.6 (L) 02/28/2021    HCT 34.8 (L) 02/28/2021    PLT 263 02/28/2021       Recent Labs   Lab Units 02/27/21  1514   SODIUM mmol/L 142   POTASSIUM mmol/L 4.0   CHLORIDE mmol/L 109*   BUN mg/dL 51*   CREATININE mg/dL 5.78*       Recent Labs   Lab Units 02/28/21  0322   INR  1.13           Test Results  Imaging  Radiology studies were personally reviewed.  CT of pelvis demonstrates diffuse metastatic disease through pelvis and proximal femur. Greater trochanter fracture with possible intertrochanteric extension.

## 2021-03-01 LAB — BASIC METABOLIC PANEL
ANION GAP: 7 mmol/L (ref 5–14)
ANION GAP: 9 mmol/L (ref 5–14)
BLOOD UREA NITROGEN: 46 mg/dL — ABNORMAL HIGH (ref 9–23)
BLOOD UREA NITROGEN: 49 mg/dL — ABNORMAL HIGH (ref 9–23)
BUN / CREAT RATIO: 29
BUN / CREAT RATIO: 29
CALCIUM: 7.4 mg/dL — ABNORMAL LOW (ref 8.7–10.4)
CALCIUM: 7.9 mg/dL — ABNORMAL LOW (ref 8.7–10.4)
CHLORIDE: 101 mmol/L (ref 98–107)
CHLORIDE: 97 mmol/L — ABNORMAL LOW (ref 98–107)
CO2: 22 mmol/L (ref 20.0–31.0)
CO2: 24 mmol/L (ref 20.0–31.0)
CREATININE: 1.57 mg/dL — ABNORMAL HIGH
CREATININE: 1.68 mg/dL — ABNORMAL HIGH
EGFR CKD-EPI AA MALE: 45 mL/min/{1.73_m2} — ABNORMAL LOW (ref >=60)
EGFR CKD-EPI AA MALE: 49 mL/min/{1.73_m2} — ABNORMAL LOW (ref >=60)
EGFR CKD-EPI NON-AA MALE: 39 mL/min/{1.73_m2} — ABNORMAL LOW (ref >=60)
EGFR CKD-EPI NON-AA MALE: 42 mL/min/{1.73_m2} — ABNORMAL LOW (ref >=60)
GLUCOSE RANDOM: 222 mg/dL — ABNORMAL HIGH (ref 70–179)
GLUCOSE RANDOM: 347 mg/dL — ABNORMAL HIGH (ref 70–179)
POTASSIUM: 3.6 mmol/L (ref 3.4–4.8)
POTASSIUM: 3.8 mmol/L (ref 3.4–4.8)
SODIUM: 128 mmol/L — ABNORMAL LOW (ref 135–145)
SODIUM: 132 mmol/L — ABNORMAL LOW (ref 135–145)

## 2021-03-01 LAB — CBC W/ AUTO DIFF
BASOPHILS ABSOLUTE COUNT: 0 10*9/L (ref 0.0–0.1)
BASOPHILS RELATIVE PERCENT: 0.4 %
EOSINOPHILS ABSOLUTE COUNT: 0 10*9/L (ref 0.0–0.5)
EOSINOPHILS RELATIVE PERCENT: 0.1 %
HEMATOCRIT: 31.2 % — ABNORMAL LOW (ref 39.0–48.0)
HEMOGLOBIN: 10.6 g/dL — ABNORMAL LOW (ref 12.9–16.5)
LYMPHOCYTES ABSOLUTE COUNT: 1.1 10*9/L (ref 1.1–3.6)
LYMPHOCYTES RELATIVE PERCENT: 14.1 %
MEAN CORPUSCULAR HEMOGLOBIN CONC: 34 g/dL (ref 32.0–36.0)
MEAN CORPUSCULAR HEMOGLOBIN: 30 pg (ref 25.9–32.4)
MEAN CORPUSCULAR VOLUME: 88.3 fL (ref 77.6–95.7)
MEAN PLATELET VOLUME: 7.8 fL (ref 6.8–10.7)
MONOCYTES ABSOLUTE COUNT: 0.6 10*9/L (ref 0.3–0.8)
MONOCYTES RELATIVE PERCENT: 7.9 %
NEUTROPHILS ABSOLUTE COUNT: 6 10*9/L (ref 1.8–7.8)
NEUTROPHILS RELATIVE PERCENT: 77.5 %
PLATELET COUNT: 215 10*9/L (ref 150–450)
RED BLOOD CELL COUNT: 3.53 10*12/L — ABNORMAL LOW (ref 4.26–5.60)
RED CELL DISTRIBUTION WIDTH: 16.7 % — ABNORMAL HIGH (ref 12.2–15.2)
WBC ADJUSTED: 7.8 10*9/L (ref 3.6–11.2)

## 2021-03-01 LAB — CBC
HEMATOCRIT: 31.2 % — ABNORMAL LOW (ref 39.0–48.0)
HEMOGLOBIN: 10.6 g/dL — ABNORMAL LOW (ref 12.9–16.5)
MEAN CORPUSCULAR HEMOGLOBIN CONC: 34 g/dL (ref 32.0–36.0)
MEAN CORPUSCULAR HEMOGLOBIN: 30 pg (ref 25.9–32.4)
MEAN CORPUSCULAR VOLUME: 88.3 fL (ref 77.6–95.7)
MEAN PLATELET VOLUME: 7.8 fL (ref 6.8–10.7)
PLATELET COUNT: 215 10*9/L (ref 150–450)
RED BLOOD CELL COUNT: 3.53 10*12/L — ABNORMAL LOW (ref 4.26–5.60)
RED CELL DISTRIBUTION WIDTH: 16.7 % — ABNORMAL HIGH (ref 12.2–15.2)
WBC ADJUSTED: 7.8 10*9/L (ref 3.6–11.2)

## 2021-03-01 LAB — HEPATIC FUNCTION PANEL
ALBUMIN: 1.7 g/dL — ABNORMAL LOW (ref 3.4–5.0)
ALKALINE PHOSPHATASE: 671 U/L — ABNORMAL HIGH (ref 46–116)
ALT (SGPT): 11 U/L (ref 10–49)
AST (SGOT): 13 U/L (ref <=34)
BILIRUBIN DIRECT: 0.3 mg/dL (ref 0.00–0.30)
BILIRUBIN TOTAL: 0.4 mg/dL (ref 0.3–1.2)
PROTEIN TOTAL: 4 g/dL — ABNORMAL LOW (ref 5.7–8.2)

## 2021-03-01 LAB — CORTISOL: CORTISOL TOTAL: 11.3 ug/dL

## 2021-03-01 LAB — MAGNESIUM: MAGNESIUM: 1.6 mg/dL (ref 1.6–2.6)

## 2021-03-01 LAB — PHOSPHORUS: PHOSPHORUS: 4.8 mg/dL (ref 2.4–5.1)

## 2021-03-01 LAB — SYPHILIS SCREEN: SYPHILIS RPR SCREEN: NONREACTIVE

## 2021-03-01 MED ADMIN — melatonin tablet 3 mg: 3 mg | ORAL | @ 22:00:00

## 2021-03-01 MED ADMIN — melatonin tablet 3 mg: 3 mg | ORAL | @ 01:00:00

## 2021-03-01 MED ADMIN — dronabinoL (MARINOL) capsule 5 mg: 5 mg | ORAL | @ 01:00:00

## 2021-03-01 MED ADMIN — ceFAZolin (ANCEF) IVPB 1 g (premix): 1 g | INTRAVENOUS | @ 19:00:00 | Stop: 2021-03-01

## 2021-03-01 MED ADMIN — oxyCODONE (ROXICODONE) immediate release tablet 10 mg: 10 mg | ORAL | @ 06:00:00 | Stop: 2021-03-14

## 2021-03-01 MED ADMIN — aspirin chewable tablet 81 mg: 81 mg | ORAL | @ 13:00:00

## 2021-03-01 MED ADMIN — oxyCODONE (OXYCONTIN) 12 hr crush resistant ER/CR tablet 10 mg: 10 mg | ORAL | @ 01:00:00 | Stop: 2021-03-14

## 2021-03-01 MED ADMIN — ceFAZolin (ANCEF) IVPB 1 g (premix): 1 g | INTRAVENOUS | @ 09:00:00 | Stop: 2021-03-01

## 2021-03-01 MED ADMIN — pantoprazole (PROTONIX) EC tablet 40 mg: 40 mg | ORAL | @ 13:00:00

## 2021-03-01 MED ADMIN — HYDROmorphone (PF) (DILAUDID) injection 1 mg: 1 mg | INTRAVENOUS | @ 13:00:00 | Stop: 2021-03-14

## 2021-03-01 MED ADMIN — midodrine (PROAMATINE) tablet 5 mg: 5 mg | ORAL | @ 13:00:00

## 2021-03-01 MED ADMIN — ascorbic acid (vitamin C) (VITAMIN C) tablet 500 mg: 500 mg | ORAL | @ 13:00:00

## 2021-03-01 MED ADMIN — acetaminophen (TYLENOL) tablet 1,000 mg: 1000 mg | ORAL | @ 06:00:00 | Stop: 2021-03-01

## 2021-03-01 MED ADMIN — ceFAZolin (ANCEF) IVPB 1 g (premix): 1 g | INTRAVENOUS | @ 02:00:00 | Stop: 2021-03-01

## 2021-03-01 MED ADMIN — acetaminophen (TYLENOL) tablet 1,000 mg: 1000 mg | ORAL | @ 16:00:00 | Stop: 2021-03-01

## 2021-03-01 MED ADMIN — dronabinoL (MARINOL) capsule 5 mg: 5 mg | ORAL | @ 13:00:00

## 2021-03-01 MED ADMIN — oxyCODONE (ROXICODONE) immediate release tablet 10 mg: 10 mg | ORAL | @ 22:00:00 | Stop: 2021-03-14

## 2021-03-01 MED ADMIN — enoxaparin (LOVENOX) syringe 40 mg: 40 mg | SUBCUTANEOUS | @ 13:00:00

## 2021-03-01 MED ADMIN — oxyCODONE (OXYCONTIN) 12 hr crush resistant ER/CR tablet 10 mg: 10 mg | ORAL | @ 13:00:00 | Stop: 2021-03-14

## 2021-03-01 MED ADMIN — pantoprazole (PROTONIX) EC tablet 40 mg: 40 mg | ORAL | @ 01:00:00

## 2021-03-01 MED ADMIN — senna (SENOKOT) tablet 2 tablet: 2 | ORAL | @ 01:00:00

## 2021-03-01 MED ADMIN — midodrine (PROAMATINE) tablet 5 mg: 5 mg | ORAL | @ 01:00:00

## 2021-03-01 MED ADMIN — dronabinoL (MARINOL) capsule 5 mg: 5 mg | ORAL | @ 22:00:00

## 2021-03-01 MED ADMIN — midodrine (PROAMATINE) tablet 5 mg: 5 mg | ORAL | @ 19:00:00

## 2021-03-01 MED ADMIN — oxyCODONE (ROXICODONE) immediate release tablet 10 mg: 10 mg | ORAL | @ 19:00:00 | Stop: 2021-03-14

## 2021-03-01 MED ADMIN — lactated ringers bolus 500 mL: 500 mL | INTRAVENOUS | @ 16:00:00 | Stop: 2021-03-01

## 2021-03-01 MED ADMIN — oxyCODONE (ROXICODONE) immediate release tablet 10 mg: 10 mg | ORAL | @ 11:00:00 | Stop: 2021-03-14

## 2021-03-01 MED ADMIN — acetaminophen (TYLENOL) tablet 1,000 mg: 1000 mg | ORAL | @ 01:00:00

## 2021-03-01 MED ADMIN — citalopram (CeleXA) tablet 20 mg: 20 mg | ORAL | @ 13:00:00 | Stop: 2021-03-01

## 2021-03-01 MED ADMIN — polyethylene glycol (MIRALAX) packet 17 g: 17 g | ORAL | @ 13:00:00

## 2021-03-01 NOTE — Unmapped (Signed)
OCCUPATIONAL THERAPY  Evaluation (03/01/21 0852)    Patient Name:  Douglas Gray       Medical Record Number: [id number]   Date of Birth: 1946/07/31  Sex: Male          OT Treatment Diagnosis:  Decreased activity tolerance, decreased functional cognition, pain, RLE  pathological fx s/p CMN ono 4/5 all impacting safe participation in  ADL/IADL routines    Assessment    Problem List: Fall Risk, Impaired ADLs, Pain, Decreased mobility, Decreased endurance, Decreased cognition, Orthopedic restrictions    Clinical Decision Making: Moderate    Assessment: Douglas Gray is a 75 y.o. 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     Douglas Gray presents to skilled acute OT services with decreased activity tolerance, decreased functional cognition, pain, RLE pathological fx s/p CMN ono 4/5 all impacting safe participation in ADL/IADL routines. Pt completed and assisted with bed level self-feeding, and functional bed mobility. Pt deferred EOB/OOB 2/2 nausea and pain. This pt would benefit from continued skilled acute OT services to address ADL deficits, and promote safety and functional independence during daily activities. Based on consideration of pt's occupational profile, assessment review, level of clinical decision making involved, and intervention plan, this pt is considered to be a moderate complexity case. This pt would benefit from 5x/week, low intensity post-acute skilled OT services to maximize safe engagement in daily routines, life roles, and meaningful occupations.    Today's Interventions: Pt completed and assisted with bed level self-feeding, and functional bed mobility. Pt deferred EOB/OOB 2/2 nausea and pain. Pt provided skilled OT education re: role of acute OT, POC, benefits of continued ADL participation to prevent deconditioning, anticipated progression with skilled acute OT services    Activity Tolerance During Today's Session  Tolerated treatment well    Plan  Planned Frequency of Treatment:  1-2x per day for: 3-4x week       Planned Interventions:  Adaptive equipment, ADL retraining, Balance activities, Bed mobility, Compensatory tech. training, Conservation, Education - Patient, Home exercise program, Functional mobility, Functional cognition, Endurance activities, Education - Family / caregiver, Teacher, early years/pre, UE Strength / coordination exercise, Therapeutic exercise, Safety education    Post-Discharge Occupational Therapy Recommendations:   5x weekly, Low intensity   OT DME Recommendations: Defer to post acute -        GOALS:   Patient and Family Goals: To return home    Long Term Goal #1: Pt will score 19+ on APMAC in 4 weeks       Short Term:  Pt will participate in EOB assessment in preparation for seated ADL   Time Frame : 2 weeks  Pt will participate in OOB/transfer assessment in preparation for OOB ADL   Time Frame : 2 weeks  Pt will complete LB dressing with min A utilizing AE/compensatory strategies PRN   Time Frame : 2 weeks                  Prognosis:  Good  Positive Indicators:  PLOF, support of wife  Barriers to Discharge: Endurance deficits, Pain, Inability to safely perform ADLS, Inaccessible home environment, Cognitive deficits    Subjective  Current Status Pt received/left semi-reclined at bed level, call bell in reach, all immediate needs met, RN Angelique Blonder present and aware  Prior Functional Status Prior to recent admission, Douglas Gray reports that he was at a SNF. He was utilizing a rw to walk short distances. He was getting himself to the bathroom for toileting. Otherwise he required assistance for ADL. Prior to last admission, he was independent with ADL/iADL. He likes playing word games.    Medical Tests / Procedures: reviewed       Patient / Caregiver reports: I was in a facililty.    Past Medical History:   Diagnosis Date   ??? Anxiety    ??? Arthritis    ??? Cataract    ??? CHF (congestive heart failure) (CMS-HCC)    ??? Chronic hypertension 02/25/2004   ??? Chronic kidney disease, stage 3, mod decreased GFR (CMS-HCC)     Cindra Presume, MD   ??? Cigarette smoker    ??? COPD (chronic obstructive pulmonary disease) (CMS-HCC)    ??? Coronary artery disease 02/25/2004    Prior CABG and coronary stents.   ??? Depression    ??? Diabetes mellitus type II, controlled, with no complications (CMS-HCC) 11/10/2008   ??? DVT (deep vein thrombosis) in pregnancy 06/28/2006   ??? Heart murmur    ??? Hemorrhage of gastrointestinal tract 01/12/2010   ??? History of nephrolithiasis 2013    with urosepsis.   ??? History of pneumonia    ??? Hx of CABG    ??? Hyperlipidemia LDL goal <70 02/25/2004   ??? Leg pain    ??? Lymphedema 06/07/2009    Recommended rest, elevation, compression stockings.   ??? Micturition frequency    ??? Myocardial infarction (CMS-HCC)    ??? Obesity 04/22/2007   ??? Peptic ulceration    ??? Peripheral artery disease (CMS-HCC) 06/28/2006   ??? Peripheral edema    ??? 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 Sinus infection     admited to [facility] med   ??? Syncope and collapse    ??? Valvular heart disease     Social History     Tobacco Use   ??? Smoking status: Former Smoker     Packs/day: 0.50     Years: 55.00     Pack years: 27.50     Types: Cigarettes   ??? Smokeless tobacco: Never Used   ??? Tobacco comment: Quit 5 weeks ago   Substance Use Topics   ??? Alcohol use: Never      Past Surgical History:   Procedure Laterality Date   ??? AVR with CABG  05/08/2007    Aortic valve replacement with a 25 mm Edwards-magna pericardial valve and saphenous vein graft to the distal right coronary artery and posterior descending artery. Jerilee Hoh, MD.   ??? BARIATRIC SURGERY 2010   ??? CARDIAC CATHETERIZATION     ??? CARDIAC VALVE REPLACEMENT     ??? CHOLECYSTECTOMY     ??? CORONARY ARTERY BYPASS GRAFT     ??? Coronary stents - LCX      3 x 24 & 3.5 x 16 Taxus   ??? CYSTOSCOPY WITH LEFT URETEROSCOPY. LASER LITHOTRIPSY AND BASKET STONE EXTRACTION  05/09/2012    M. Isaac Bliss, MD   ??? EYE SURGERY     ??? HERNIA REPAIR     ??? PR CATH PLACE/CORON ANGIO, IMG SUPER/INTERP,W LEFT HEART VENTRICULOGRAPHY N/A 03/24/2020    Procedure: LEFT HEART CATHETERIZATION WITH POSSIBLE REVASCULARIZATION;  Surgeon: Fayrene Fearing  Levy Sjogren, MD;  Location: [facility];  Service: Cardiology   ??? PR COLONOSCOPY FLX DX W/COLLJ SPEC WHEN PFRMD N/A 11/28/2020    Procedure: COLONOSCOPY, FLEXIBLE, PROXIMAL TO SPLENIC FLEXURE; DIAGNOSTIC, W/WO COLLECTION SPECIMEN BY BRUSH OR WASH;  Surgeon: Beverly Milch, MD;  [facility];  Service: Gastrointestinal   ??? PR INSERTION SUBQ CARDIAC RHYTHM MONITOR W/PRGRMG N/A 04/21/2020    Procedure: LOOP RECORDER IMPLANTATION;  Surgeon: Bettye Boeck, MD;  Location: [facility];  Service: Cardiology   ??? PR UPPER GI ENDOSCOPY,BIOPSY N/A 11/28/2020    Procedure: UGI ENDOSCOPY; WITH BIOPSY, SINGLE OR MULTIPLE;  Surgeon: Beverly Milch, MD;  [facility];  Service: Gastrointestinal   ??? PR UPPER GI ENDOSCOPY,DIAGNOSIS N/A 01/27/2021    Procedure: UGI ENDO, INCLUDE ESOPHAGUS, STOMACH, & DUODENUM &/OR JEJUNUM; DX W/WO COLLECTION SPECIMN, BY BRUSH OR WASH;  Surgeon: Annie Paras, MD;  [facility];  Service: Gastroenterology   ??? PRIOR BILATERAL PATELLA SURGERY  1968 & 1973   ??? VAGOTOMY      Family History   Problem Relation Age of Onset   ??? Heart disease Mother    ??? Stroke Mother    ??? Cancer Father    ??? Depression Father    ??? Heart disease Father    ??? Breast cancer Sister    ??? Heart attack Brother    ??? Heart attack Brother    ??? Heart attack Brother         Metformin, Mirtazapine, Janumet [sitagliptin-metformin], and Indomethacin     Objective Findings  Precautions / Restrictions  Falls precautions    Weight Bearing  Non-applicable    Required Braces or Orthoses  Non-applicable    Communication Preference  Verbal    Pain  pt endorsesd 8/10 RLE pain, OT called RN Angelique Blonder and she administered pain meds.    Equipment / Environment  Vascular access (PIV, TLC, Port-a-cath, PICC), Patient not wearing mask for full session    Living Situation  Living Environment: House  Lives With: Spouse  Home Living: One level home, Level entry, Grab bars in shower, Walk-in shower, Built-in shower seat, Shower chair with back  Equipment available at home: [employer]     Cognition   Orientation Level:  Disoriented to place, Disoriented to time, Disoriented to situation   Arousal/Alertness:  Appropriate responses to stimuli   Attention Span:  Attends with cues to redirect   Memory:  Appears intact   Following Commands:  Follows all commands and directions without difficulty   Safety Judgment:  Unable to assess   Awareness of Errors:  Unable to assess   Problem Solving:  Unable to assess   Comments:      Vision / Hearing   Vision: No deficits identified     Hearing: No deficit identified         Hand Function:  Right Hand Function: Right hand grip strength, ROM and coordination WNL  Left Hand Function: Left hand grip strength, ROM and coordination WNL  Hand Dominance: Right    Skin Inspection:  Skin Inspection: Intact where visualized    ROM / Strength:  UE ROM/Strength: Left WFL, Right WFL  LE ROM/Strength: Left WFL, Right Impaired/Limited  RLE Impairment: Limited AROM, Pain with movement    Coordination:  Coordination: WFL    Sensation:  RUE Sensation: RUE intact  LUE Sensation: LUE intact    Balance:  EOB/OOB deferred by pt    Functional  Mobility  Transfer Assistance Needed:  (NT)  Bed Mobility Assistance Needed: Yes  Bed Mobility - Needs Assistance: Min assist (logroll L<>R)  Ambulation: NT      ADLs  ADLs: Needs assistance with ADLs  ADLs - Needs Assistance: Feeding, Grooming, Bathing, Toileting, UB dressing, LB dressing  Feeding - Needs Assistance: Set Up Assist, Performed at bed level, Verbal assist/cues required (drinking juice at bed level with moderate cueing for sustained participation)  Grooming - Needs Assistance: Set Up Assist, Performed at Bed level  Bathing - Needs Assistance: Max assist, Performed at bed level  Toileting - Needs Assistance: Max assist, Performed at bed level  UB Dressing - Needs Assistance: Min assist, Performed at bed level  LB Dressing - Needs Assistance: Total Assist, Performed at bed level      Vitals / Orthostatics  At Rest: BP 106/55, HR=66  With Activity: NAD  Orthostatics: asymptomatic      Medical Staff Made Aware: RN      Occupational Therapy Session Duration  OT Individual [mins]: 42  Reason for Co-treatment: Poor activity tolerance         I attest that I have reviewed the above information.  Signed: Danielle Dess, OT  Filed 03/01/2021

## 2021-03-01 NOTE — Unmapped (Addendum)
[facility]  PATIENT ENCOUNTER NOTE  Summary:  [facility] Case Manager spoke with patient today for Case Management introduction. Patient instructed on the role of [facility] case Production designer, theatre/television/film in their care. Patient encouraged to reach out with questions or concerns.     Patient/caregiver reported he isn't able to participate in PT/OT due to his high pain level. He would like help getting better pain control so he can get stronger and go home..        Goals: pain relief, return home to complete PT/OT, return to previous level of functioning.            Objective:   Problem List     ??? Coronary artery disease involving native coronary artery of native heart without angina pectoris    ??? Heart valve disease    ??? Nonrheumatic mitral valve regurgitation    ??? Leaky heart valve    ??? History of heart artery stent    ??? History of coronary artery bypass graft    ??? Status post aortic valve replacement with bioprosthetic valve    ??? Decreased circulation    ??? High cholesterol, LDL goal below 70    ??? Narrowing of artery in neck    ??? Type 2 diabetes mellitus with stage 3 chronic kidney disease, without long-term current use of insulin    ??? Stage 3 chronic kidney disease    ??? Syncope    ??? DIFFICULTY IN WALKING    ??? Discomfort of back    ??? Intermittent palpitations    ??? Atrial fibrillation (irregular heartbeat)    ??? Fast heart beat    ??? Abnormal cardiovascular stress test    ??? Slow heart rate    ??? Dyspnea on effort    ??? History of falling    ??? Gastrointestinal hemorrhage with melena    ??? Secondary cancer of bone    ??? High prostate specific antigen (PSA)    ??? Prostate cancer metastatic to bone    ??? Osteoporosis    ??? Collapsed lung    ??? Pathological fracture of rib of right side    ??? Pathologic fracture of neck of right femur            Douglas Gray  03/01/2021  '

## 2021-03-01 NOTE — Unmapped (Signed)
Pt alert and oriented, but forgetful with some confusion. VSS, no falls and afebrile. No acute events. Pt c/o R surgical hip pain, PRN oxy 10mg  given x1. R hip surgical site dressing clean/intact and not removed. Dressing changed on L Forearm skin tear. Q2 turns maintained. Safety measures remain in place with bed low, brakes locked, side rails up, call bell within reach, non-skid footwear when out of bed. Will continue to monitor.

## 2021-03-01 NOTE — Unmapped (Signed)
Orthopaedics Progress Note    Assessment and Plan:  75 y.o. male s/p CMN fixation of right pathologic intertrochanteric femur fracture 02/28/21    ??? Okay from orthopaedic perspective to consider palliative radiation to proximal femur and pelvic metastasis  ??? DVT Prophylaxis: Lovenox 40 mg daily, SCDs, ambulation.  ??? Weightbearing as tolerated.  ??? Multimodal Pain control. Transition off narcotic and IV medications as tolerated.  ??? Antibiotics: surgical prophylaxis indicated for 23 hrs postoperatively.  ??? Dressing to be changed POD3   by MD then PRN by nursing.   ??? Therapy: PT/OT  ??? Disposition: Per primary. Okay to discharge from orthopaedic standpoint. We will arrange follow-up after discharge.     Subjective  No acute events overnight.  Patient states pain controlled.  This morning says he thinks he has some tingling on the top of his foot, and on the medial side of his foot.  No chest pain shortness of breath.  Afebrile.    Objective  Vitals: Blood pressure 104/53, pulse 62, temperature 36.6 ??C, temperature source Oral, resp. rate 15, SpO2 99 %.  General Appearance: Awake and alert   MSK exam:(RLE):    Cardiovascular:   Palpable DP pulse.    Neuro/Compartments:   RLE: Dressing clean, dry, intact, no strikethrough.  Patient fires GS, TA, EHL.  Patient sensation light touch is the same compared to contralateral extremity letter from first webspace, dorsal plantar aspect of foot, medial lateral aspect of foot (though says there is subjective tingling on the medial and the dorsal part of foot despite feeling the same to light touch).  Compartment soft compressible.  No pain with passive stretch.  DP pulse 2+.    ?? Dressing/Splint is clean, dry and intact     Diagnostic Studies    Radiology/Cultures:  No new imaging to review     Relevant Labs:  Recent Results (from the past 24 hour(s))   POCT Glucose    Collection Time: 02/28/21  3:08 PM   Result Value Ref Range    Glucose, POC 97 70 - 179 mg/dL       Contact  Please contact Clement Sayres, APP [phone]) 6am-3pm Tuesday-Friday  On nights (6pm-6am), weekends, and holidays, please page the Consult pager.  On Mondays please contact the person who leaves daily progress notes.

## 2021-03-01 NOTE — Unmapped (Signed)
Orthopaedics Progress Note    Assessment and Plan:  75 y.o. male s/p CMN fixation of right pathologic intertrochanteric femur fracture 02/28/21    ??? Okay from orthopaedic perspective to consider palliative radiation to proximal femur and pelvic metastasis  ??? DVT Prophylaxis: Lovenox 40 mg daily, SCDs, ambulation.  ??? Weightbearing as tolerated.  ??? Multimodal Pain control. Transition off narcotic and IV medications as tolerated.  ??? Antibiotics: surgical prophylaxis indicated for 23 hrs postoperatively.  ??? Dressing to be changed POD3   by MD then PRN by nursing.   ??? Therapy: PT/OT  ??? Disposition: Per primary. Okay to discharge from orthopaedic standpoint. We will arrange follow-up after discharge.     Subjective  No acute events postoperative.  Patient denies numbness or tingling in his lower extremities.  Pain is well controlled.  Bed comfortably.  No shortness of breath.  He has not had a chance to eat just yet; however no nausea or vomiting    Objective  Vitals: Blood pressure 110/57, pulse 65, temperature 36.1 ??C, temperature source Temporal, resp. rate 13, SpO2 95 %.  General Appearance: Awake and alert   MSK exam:(RLE):    Cardiovascular:   Palpable DP pulse.    Neuro/Compartments:   RLE: +motor in GS/TA/EHL. SILT in superficial/deep peroneal, saphenous, sural and tibial nerve distributions. Compartments are soft and compressible. No pain with passive stretch of toes.    ?? Dressing/Splint is clean, dry and intact     Diagnostic Studies    Radiology/Cultures:  No new imaging to review     Relevant Labs:  Recent Results (from the past 24 hour(s))   hsTroponin I - 6 Hour    Collection Time: 02/27/21 10:09 PM   Result Value Ref Range    hsTroponin I 21 <=53 ng/L    delta hsTroponin I 4 <=7 ng/L   COVID-19 PCR    Collection Time: 02/27/21 10:09 PM    Specimen: Nasopharyngeal Swab   Result Value Ref Range    SARS-CoV-2 PCR Negative Negative   T4, Free    Collection Time: 02/27/21 10:09 PM   Result Value Ref Range    Free T4 1.21 0.89 - 1.76 ng/dL   Hepatic Function Panel    Collection Time: 02/28/21  3:22 AM   Result Value Ref Range    Albumin 2.0 (L) 3.4 - 5.0 g/dL    Total Protein 4.5 (L) 5.7 - 8.2 g/dL    Total Bilirubin 1.0 0.3 - 1.2 mg/dL    Bilirubin, Direct 1.61 (H) 0.00 - 0.30 mg/dL    AST 12 <=09 U/L    ALT 7 (L) 10 - 49 U/L    Alkaline Phosphatase 779 (H) 46 - 116 U/L   Magnesium Level    Collection Time: 02/28/21  3:22 AM   Result Value Ref Range    Magnesium 1.5 (L) 1.6 - 2.6 mg/dL   Phosphorus Level    Collection Time: 02/28/21  3:22 AM   Result Value Ref Range    Phosphorus 3.8 2.4 - 5.1 mg/dL   Type and Screen    Collection Time: 02/28/21  3:22 AM   Result Value Ref Range    ABO Grouping A POS     Antibody Screen NEG    PT-INR    Collection Time: 02/28/21  3:22 AM   Result Value Ref Range    PT 13.2 10.3 - 13.4 sec    INR 1.13    CBC w/ Differential    Collection  Time: 02/28/21  3:22 AM   Result Value Ref Range    WBC 8.0 3.6 - 11.2 10*9/L    RBC 3.95 (L) 4.26 - 5.60 10*12/L    HGB 11.6 (L) 12.9 - 16.5 g/dL    HCT 29.5 (L) 62.1 - 48.0 %    MCV 88.1 77.6 - 95.7 fL    MCH 29.4 25.9 - 32.4 pg    MCHC 33.4 32.0 - 36.0 g/dL    RDW 30.8 (H) 65.7 - 15.2 %    MPV 7.8 6.8 - 10.7 fL    Platelet 263 150 - 450 10*9/L    Neutrophils % 72.7 %    Lymphocytes % 18.1 %    Monocytes % 6.6 %    Eosinophils % 1.5 %    Basophils % 1.1 %    Absolute Neutrophils 5.8 1.8 - 7.8 10*9/L    Absolute Lymphocytes 1.4 1.1 - 3.6 10*9/L    Absolute Monocytes 0.5 0.3 - 0.8 10*9/L    Absolute Eosinophils 0.1 0.0 - 0.5 10*9/L    Absolute Basophils 0.1 0.0 - 0.1 10*9/L    Anisocytosis Slight (A) Not Present   Basic Metabolic Panel    Collection Time: 02/28/21  3:22 AM   Result Value Ref Range    Sodium 140 135 - 145 mmol/L    Potassium 3.9 3.4 - 4.8 mmol/L    Chloride 108 (H) 98 - 107 mmol/L    CO2 23.0 20.0 - 31.0 mmol/L    Anion Gap 9 5 - 14 mmol/L    BUN 42 (H) 9 - 23 mg/dL    Creatinine 8.46 (H) 0.60 - 1.10 mg/dL    BUN/Creatinine Ratio 30     EGFR CKD-EPI Non-African American, Male 48 (L) >=60 mL/min/1.41m2    EGFR CKD-EPI African American, Male 56 (L) >=60 mL/min/1.71m2    Glucose 86 70 - 179 mg/dL    Calcium 8.4 (L) 8.7 - 10.4 mg/dL   Vitamin N62 Level    Collection Time: 02/28/21  3:22 AM   Result Value Ref Range    Vitamin B-12 1,234 (H) 211 - 911 pg/ml   TSH    Collection Time: 02/28/21  3:22 AM   Result Value Ref Range    TSH 6.580 (H) 0.550 - 4.780 uIU/mL   POCT Glucose    Collection Time: 02/28/21  3:08 PM   Result Value Ref Range    Glucose, POC 97 70 - 179 mg/dL       Contact  Please contact Clement Sayres, APP [phone]) 6am-3pm Tuesday-Friday  On nights (6pm-6am), weekends, and holidays, please page the Consult pager.  On Mondays please contact the person who leaves daily progress notes.

## 2021-03-01 NOTE — Unmapped (Signed)
Care Management  Initial Transition Planning Assessment              General  Care Manager assessed the patient by : In person interview with patient, Medical record review, Discussion with Clinical Care team  Orientation Level: Oriented X4  Functional level prior to admission: Independent  Reason for referral: Discharge Planning    Contact/Decision [facility]  Extended Emergency Contact Information  Primary Emergency Contact: Douglas Gray,Douglas Gray  Address: [address] [place], [place] [address] Gray Douglas of [place]  Home Phone: [phone]  Mobile Phone: [phone]  Relation: Spouse  Secondary Emergency Contact: Blatt,Lisa  Address: [address]           [place], [place] [address] Douglas Gray of [place]  Home Phone: [phone]  Relation: Daughter    Legal Next of Kin / Guardian / POA / Advance Directives       Advance Directive (Medical Treatment)  Does patient have an advance directive covering medical treatment?: Patient does not have advance directive covering medical treatment. (Patient states his wife, Douglas Gray, would be his HCDM)  Advance directive covering medical treatment not in Chart:: Copy requested from family  Reason patient does not have an advance directive covering medical treatment:: Patient does not wish to complete one at this time.    Health Care Decision Maker [HCDM] (Medical & Mental Health Treatment)  Healthcare Decision Maker: HCDM documented in the HCDM/Contact Info section.         Patient Information  Lives with: Spouse/significant other    Type of Residence: Private residence        Location/Detail: There are no steps to enter the front of this residence   Type of Residence: Mailing Address:  [address]  [place] [address]  Contacts: Accompanied by: Alone  Patient Phone Number:   Telephone Information:   Mobile [phone]           Medical Provider(s): Marjie Skiff, AGNP  Reason for Admission: Admitting Diagnosis:  Pathological fracture of right femur due to neoplastic disease, initial encounter (CMS-HCC) [Q03.474Q]  Past Medical History:   has a past medical history of Anxiety, Arthritis, Cataract, CHF (congestive heart failure) (CMS-HCC), Chronic hypertension (02/25/2004), Chronic kidney disease, stage 3, mod decreased GFR (CMS-HCC), Cigarette smoker, COPD (chronic obstructive pulmonary disease) (CMS-HCC), Coronary artery disease (02/25/2004), Depression, Diabetes mellitus type II, controlled, with no complications (CMS-HCC) (11/10/2008), DVT (deep vein thrombosis) in pregnancy (06/28/2006), Heart murmur, Hemorrhage of gastrointestinal tract (01/12/2010), History of nephrolithiasis (2013), History of pneumonia, CABG, Hyperlipidemia LDL goal <70 (02/25/2004), Leg pain, Lymphedema (06/07/2009), Micturition frequency, Myocardial infarction (CMS-HCC), Obesity (04/22/2007), Peptic ulceration, Peripheral artery disease (CMS-HCC) (06/28/2006), Peripheral edema, S/P AVR, Sinus infection, Syncope and collapse, and Valvular heart disease.  Past Surgical History:   has a past surgical history that includes Coronary artery bypass graft; Cholecystectomy; Eye surgery; Hernia repair; Cardiac valve replacement; Coronary stents - LCX; Vagotomy; PRIOR BILATERAL PATELLA SURGERY (1968 & 1973); AVR with CABG (05/08/2007); Bariatric Surgery (2010); CYSTOSCOPY WITH LEFT URETEROSCOPY. LASER LITHOTRIPSY AND BASKET STONE EXTRACTION (05/09/2012); pr cath place/coron angio, img super/interp,w left heart ventriculography (N/A, 03/24/2020); Cardiac catheterization; pr insertion subq cardiac rhythm monitor w/prgrmg (N/A, 04/21/2020); pr upper gi endoscopy,biopsy (N/A, 11/28/2020); pr colonoscopy flx dx w/collj spec when pfrmd (N/A, 11/28/2020); and pr upper gi endoscopy,diagnosis (N/A, 01/27/2021).   Previous admit date: 11/24/2020    Primary Insurance- Payor: MEDICARE / Plan: MEDICARE PART A AND PART B /  Product Type: *No Product type* /   Secondary Insurance ??? Secondary Insurance  BCBS  Prescription Coverage ??? BCBS  Preferred [facility]acy - [facility] [phone] - [place], [place] - [address]  [facility] [place] - [place], [place] - [address]  [facility] [place], [place] - [address]    Transportation home: Private vehicle (Patient's wife can drive him home)          Support Systems/Concerns: Spouse, Children    Responsibilities/Dependents at home?: No    Home Care services in place prior to admission?: No                  Equipment Currently Used at Home: cane, straight, shower chair, other (see comments), commode chair (Rollator)       Currently receiving outpatient dialysis?: No       Financial Information       Need for financial assistance?: No       Social Determinants of Health  Social Determinants of Health were addressed in provider documentation.  Please refer to patient history.    Discharge Needs Assessment  Concerns to be Addressed: discharge planning    Clinical Risk Factors: > 65, Principal Diagnosis: Cancer, Stroke, COPD, Heart Failure, AMI, Pneumonia, Joint Replacment    Barriers to taking medications: No    Prior overnight hospital stay or ED visit in last 90 days: No              Anticipated Changes Related to Illness: none    Equipment Needed After Discharge: none    Discharge Facility/Level of Care Needs: nursing facility, skilled    Readmission  Risk of Unplanned Readmission Score: UNPLANNED READMISSION SCORE: 34%  Predictive Model Details          34% (High)  Factor Value    Calculated 03/01/2021 12:01 25% Number of active Rx orders 52     Risk of Unplanned Readmission Model 12% Number of ED visits in last six months 3     8% Charlson Comorbidity Index 10     7% Diagnosis of cancer present     7% ECG/EKG order present in last 6 months     6% Latest calcium low (7.9 mg/dL)     6% Latest BUN high (49 mg/dL)     5% Imaging order present in last 6 months     4% Latest hemoglobin low (10.6 g/dL) 4% Age 23     4% Phosphorous result present     3% Number of hospitalizations in last year 1     3% Active anticoagulant Rx order present     3% Latest creatinine high (1.68 mg/dL)     1% Future appointment scheduled     1% Current length of stay 1.474 days     1% Active ulcer medication Rx order present      Readmitted Within the Last 30 Days? (No if blank)   Patient at risk for readmission?: Yes    Discharge Plan  Screen findings are: Discharge planning needs identified or anticipated (Comment).    Expected Discharge Date: 03/03/2021    Expected Transfer from Critical Care:      Quality data for continuing care services shared with patient and/or representative?: N/A  Patient and/or family were provided with choice of facilities / services that are available and appropriate to meet post hospital care needs?: N/A  Initial Assessment complete?: Yes

## 2021-03-01 NOTE — Unmapped (Addendum)
Radiation Oncology Consult Note    Patient Name: Douglas Gray  Patient Age: 75 y.o.  Encounter Date: 02/27/2021    Referring Physician:  Referred Self  No address on file    Primary Care Provider:  Marjie Skiff, AGNP    Assessment:   Douglas Gray is a 75 y.o. 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     Recommendations:    We reviewed the diagnostic workup and treatment to date, including his imaging pre and post surgery. This pt has diffuse osseous metastatic disease. We discussed palliative radiation to the right hip and femur vs whole pelvis and bilateral femurs (hemibody). We don't feel hemibody radiation would be helpful to him at this point since during our encounter, localized most of his pain to the right hip and femur. We recommend post-operative palliative radiation to this area to improve his pain and reduce the subsequent risk of fracture. We would plan for a 5-10 fraction course and would start this 2-4 weeks following surgery. We would typically recommend 10 fractions but with his performance status, prognosis, and pain when lying flat on hard surfaces (the position he'd be in during treatment), 5 fractions may be the better option for him.     He would prefer to be treated in [place]. We will refer him to Dr. Carles Collet (radiation oncologist at our [facility] location).     Reason for Consultation:   Jessica Seidman is a 75 y.o. male who is seen in consultation at the request Dr. Dorna Mai for an opinion regarding the treatment of his metastatic prostate cancer. Send a copy of the note (cc: Dr. Dorna Mai) to the requesting physician    History of Present Illness:  ?? This patient was admitted on 02/28/21 with pathologic right femur and right rib fractures.   ?? Admitted to [facility] 3 weeks ago following a fall when attempting to stand. Found to have rib fractures. Subsequently discharged to a SNF.  ?? More recently, he fell backwards, hit his head, and felt a pop in his right thigh.   ?? Hx of HSPC and followed by Dr. Okey Dupre. High volume metastatic disease. Currently on relugolix and discussed starting enzalutatmide.   ?? He presented to their clinic on 02/27/21 with severe pain, dizziness, confusion, and hypotension. He has been on ADT since Jan 2022.   ?? CT abd/pel 02/27/21: Numerous pathologic right sided displaced posterior rib fractures with a right sided pneumothorax. Nondisplaced pathologic right intertrochanteric femur fracture. Extensive osseous metastases, with extensive increase in number and size of sclerotic lesions throughout all visualized osseous structures  ?? 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 Anxiety    ??? Arthritis    ??? Cataract    ??? CHF (congestive heart failure) (CMS-HCC)    ??? Chronic hypertension 02/25/2004   ??? Chronic kidney disease, stage 3, mod decreased GFR (CMS-HCC)     Cindra Presume, MD   ??? Cigarette smoker    ??? COPD (chronic obstructive pulmonary disease) (CMS-HCC)    ??? Coronary artery disease 02/25/2004    Prior CABG and coronary stents.   ??? Depression    ??? Diabetes mellitus type II, controlled, with no complications (CMS-HCC) 11/10/2008   ??? DVT (deep vein thrombosis) in pregnancy 06/28/2006   ??? Heart murmur    ??? Hemorrhage of gastrointestinal tract 01/12/2010   ??? History of nephrolithiasis 2013    with urosepsis.   ??? History of pneumonia    ??? Hx of CABG    ??? Hyperlipidemia LDL goal <70 02/25/2004   ??? Leg pain    ??? Lymphedema 06/07/2009    Recommended rest, elevation, compression stockings.   ??? Micturition frequency    ??? Myocardial infarction (CMS-HCC)    ??? Obesity 04/22/2007   ??? Peptic ulceration    ??? Peripheral artery disease (CMS-HCC) 06/28/2006   ??? Peripheral edema    ??? 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 Sinus infection     admited to [facility] med   ??? Syncope and collapse    ??? Valvular heart disease        FAMILY HISTORY:  Family History   Problem Relation Age of Onset   ??? Heart disease Mother    ??? Stroke Mother    ??? Cancer Father    ??? Depression Father    ??? Heart disease Father    ??? Breast cancer Sister    ??? Heart attack Brother    ??? Heart attack Brother    ??? Heart attack Brother        SOCIAL HISTORY:   Social History     Socioeconomic History   ??? Marital status: Married     Spouse name: Not on file   ??? Number of children: 3   ??? Years of education: Not on file   ??? Highest education level: Not on file   Occupational History   ??? Not on file   Tobacco Use   ??? Smoking status: Former Smoker     Packs/day: 0.50     Years: 55.00     Pack years: 27.50     Types: Cigarettes   ??? Smokeless tobacco: Never Used   ??? Tobacco comment: Quit 5 weeks ago   Vaping Use   ??? Vaping Use: Never used   Substance and Sexual Activity   ??? Alcohol use: Never   ??? Drug use: Never   ??? Sexual activity: Not on file   Other Topics Concern   ??? Not on file   Social History Narrative   ??? Not on file     Social Determinants of Health     Financial Resource Strain: Low Risk    ??? Difficulty of Paying Living Expenses: Not hard at all   Food Insecurity: No Food Insecurity   ??? Worried About Programme researcher, broadcasting/film/video in the Last Year: Never true   ??? Ran Out of Food in the Last Year: Never true   Transportation Needs: No Transportation Needs   ??? Lack of Transportation (Medical): No   ??? Lack of Transportation (Non-Medical): No  Physical Activity: Not on file   Stress: Not on file   Social Connections: Not on file       Allergies   Allergen Reactions   ??? Metformin Swelling   ??? Mirtazapine Anxiety     Memory confusion   ??? Janumet [Sitagliptin-Metformin]    ??? Indomethacin Anxiety     (INDOCIN)         Current Medications:  Current Facility-Administered Medications   Medication Dose Route Frequency Provider Last Rate Last Admin   ??? acetaminophen (TYLENOL) tablet 1,000 mg  1,000 mg Oral Q8H Dorthey Sawyer, MD   1,000 mg at 03/01/21 0981   ??? ascorbic acid (vitamin C) (VITAMIN C) tablet 500 mg  500 mg Oral Daily Dorthey Sawyer, MD       ??? aspirin chewable tablet 81 mg  81 mg Oral Daily Dorthey Sawyer, MD       ??? ceFAZolin (ANCEF) IVPB 1 g (premix)  1 g Intravenous Q8H Dorthey Sawyer, MD   Stopped at 03/01/21 [phone]   ??? citalopram (CeleXA) tablet 20 mg  20 mg Oral Daily Dorthey Sawyer, MD       ??? dronabinoL (MARINOL) capsule 5 mg  5 mg Oral BID AC Dorthey Sawyer, MD   5 mg at 02/28/21 2043   ??? emollient combination no.92 (LUBRIDERM) lotion 1 application  1 application Topical Q1H PRN Dorthey Sawyer, MD       ??? enoxaparin (LOVENOX) syringe 40 mg  40 mg Subcutaneous Q24H Dorthey Sawyer, MD       ??? ergocalciferol-1,250 mcg (50,000 unit) (DRISDOL) capsule 1,250 mcg  1,250 mcg Oral Weekly Dorthey Sawyer, MD       ??? HYDROmorphone (PF) (DILAUDID) injection 1 mg  1 mg Intravenous Q4H PRN Dorthey Sawyer, MD       ??? lidocaine (LIDODERM) 5 % patch 3 patch  3 patch Transdermal Daily PRN Dorthey Sawyer, MD       ??? loperamide (IMODIUM) capsule 2 mg  2 mg Oral Q2H PRN Dorthey Sawyer, MD       ??? loperamide (IMODIUM) capsule 4 mg  4 mg Oral Once PRN Dorthey Sawyer, MD       ??? melatonin tablet 3 mg  3 mg Oral QPM Dorthey Sawyer, MD   3 mg at 02/28/21 2044   ??? midodrine (PROAMATINE) tablet 5 mg  5 mg Oral TID Dorthey Sawyer, MD   5 mg at 02/28/21 2044   ??? ondansetron (ZOFRAN-ODT) disintegrating tablet 8 mg  8 mg Oral Q8H PRN Dorthey Sawyer, MD        Or   ??? ondansetron [facility]) injection 4 mg  4 mg Intravenous Q8H PRN Dorthey Sawyer, MD       ??? oxyCODONE (OXYCONTIN) 12 hr crush resistant ER/CR tablet 10 mg  10 mg Oral Q12H [facility] Dorthey Sawyer, MD   10 mg at 02/28/21 2043   ??? oxyCODONE (ROXICODONE) immediate release tablet 5 mg  5 mg Oral Q4H PRN Dorthey Sawyer, MD        Or   ??? oxyCODONE (ROXICODONE) immediate release tablet 10 mg  10 mg Oral Q4H PRN Dorthey Sawyer, MD   10 mg at 03/01/21 0656   ??? pantoprazole (PROTONIX) EC tablet 40 mg  40 mg Oral BID Dorthey Sawyer, MD   40 mg at 02/28/21 2043   ??? polyethylene glycol (MIRALAX) packet 17  g  17 g Oral BID Dorthey Sawyer, MD       ??? potassium chloride (KLOR-CON) CR tablet 20 mEq  20 mEq Oral Q6H PRN Dorthey Sawyer, MD       ??? potassium chloride (KLOR-CON) CR tablet 40 mEq  40 mEq Oral Q6H PRN Dorthey Sawyer, MD       ??? potassium chloride (KLOR-CON) CR tablet 60 mEq  60 mEq Oral Q6H PRN Dorthey Sawyer, MD       ??? senna Mancel Parsons) tablet 2 tablet  2 tablet Oral Nightly Dorthey Sawyer, MD   2 tablet at 02/28/21 2043     Facility-Administered Medications Ordered in Other Encounters   Medication Dose Route Frequency Provider Last Rate Last Admin   ??? degarelix (FIRMAGON) injection 80 mg  80 mg Subcutaneous Once Evalina Field, MD           Physical Exam:   Vitals:    03/01/21 0740   BP: 107/42   Pulse: 56   Resp: 18   Temp: 36.4 ??C (97.5 ??F)   SpO2: 97%     Karnofsky/Lansky Performance Status: 50, Requires considerable assistance and frequent medical care (ECOG equivalent 2)  General: Well-developed, no acute distress  Skin: No lesions or rashes  Neuro: Alert and oriented to conversation. Difficulty recalling recent events.   HEENT:Normocephalic, moist mucous membranes  Eyes: EOMI  Cardio: Normal rate  Respiratory: Breathing comfortably on room air  GI: Non-distended  MSK: No joint pains or effusions  Psych: Normal mood and affect. Converses clearly and emotionally appropriate.    RADIOLOGY: Personally reviewed.     PATHOLOGY: Personally reviewed.    Labs: Personally reviewed.    CEA   Date Value Ref Range Status   11/24/2020 1.2 0.0 - 5.0 ng/mL Final       Lab Results   Component Value Date    WBC 7.8 03/01/2021    WBC 7.8 03/01/2021    WBC 8.0 02/28/2021    WBC 6.9 02/27/2021    WBC 6.6 02/27/2021    HGB 10.6 (L) 03/01/2021    HGB 10.6 (L) 03/01/2021    HGB 11.6 (L) 02/28/2021    HGB 13.1 02/27/2021    HGB 14.0 02/27/2021    HCT 31.2 (L) 03/01/2021    HCT 31.2 (L) 03/01/2021    HCT 34.8 (L) 02/28/2021    HCT 39.3 02/27/2021    HCT 42.6 02/27/2021    Platelet 215 03/01/2021    Platelet 215 03/01/2021    Platelet 263 02/28/2021    Platelet 272 02/27/2021    Platelet 306 02/27/2021    Creatinine 1.68 (H) 03/01/2021    Creatinine 1.57 (H) 03/01/2021    Creatinine 1.41 (H) 02/28/2021    Creatinine 1.56 (H) 02/27/2021    Creatinine 1.47 (H) 02/27/2021    AST 13 03/01/2021    AST 12 02/28/2021    AST 12 02/27/2021    ALT 11 03/01/2021    ALT 7 (L) 02/28/2021    ALT 9 (L) 02/27/2021    Magnesium 1.6 03/01/2021    Magnesium 1.5 (L) 02/28/2021       **I have reviewed all outside records as it pertains to patient's care.**    ----------------------------------------------------    The above case was reviewed, patient was seen and examined, and the plan of care was discussed with Dr. Ernest Haber    Electronically signed by:  Blythe Stanford, MD  Radiation Oncology Resident, PGY-3  [facility]  Pager: [phone]

## 2021-03-01 NOTE — Unmapped (Signed)
Addendum  created 03/01/21 1607 by Renaye Rakers, MD    Clinical Note Signed

## 2021-03-01 NOTE — Unmapped (Signed)
Oncology (MEDO) Progress Note    Assessment & Plan:   Douglas Gray is a 75 y.o. male with a PMHx of HTN, CAD, grade 2 diastolic dysfunction (echo 2020), s/p CABG & AVR, pAfib, UGIB, PUD, gastric bypass, CKD, hormone-sensitive prostate cancer with diffuse osseous metastases that presented to [facility] with after a fall from his SNF and found to have pathologic acute R displaced rib fractures associated with small pneumothorax and right??pathologic greater trochanteric femur fracture.    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 *      Right??pathologic greater trochanteric femur fracture with possible intertrochanteric extension: History of progressive R hip pain over the last 6-7 months. Found to have a R femur fracture on admission, reportedly new from 3/25 R femur XR completed at [facility]. S/p fixation on 4/6.   -Pain control: home oxycontin 10mg  BID, oxycodone prn, IV dilaudid 1mg  for breakthrough   -Post-op ancef as per Ortho for 24hrs   -RadOnc consult for post-op palliative radiation  ??  Pathologic Right Rib Fractures I Small Pneumothorax   Recent admission to [facility] for R 7th and 8th rib fractures with associated pneumothorax (3/16-3/25). Imaging on admission includes additional acute right rib displaced fractures now including at least the 6-10th posterior ribs with small associated pneumothorax and small volume pleural effusion the right hemithorax. No oxygen requirement   -pain control   -continuous pulse oximetry   -supplemental oxygen for SpO2 >90% -eval for CXR as needed  ??  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 Now on ADT s/p degarelix 11/2020 and will changed to relugolix 01/17/2021 and enzalutamide 02/01/21  (per ENZAMET and ARCHES). Enzalutamide on hold as possibly contributing to falls.  -notified Dr. Okey Dupre of admission   -wife brought in relugolix (orgovyx) from home  -home Vit D/Calcium supplement    ??  AKI CKD IIIb: Baseline Cr 1.2-1.4. Cr 1.68, likely pre-renal from decreased PO intake from pain and NPO for OR yesterday.   -500cc LR over 4 hrs due to grade 2 diastolic dysfunction  -avoid nephrotoxic medications   ??  BLE Edema: negative dopplers during admission to [facility] on 3/25. Worse in left ankle, XR negative for fracture.   -hold daily lasix 40mg  daily in setting of hypotension   ??  Severe Protein-Calorie Malnutrition in the context of chronic illness (03/01/21 0955)  Energy Intake: < or equal to 75% of estimated energy requirement for > or equal to 1 month  Fat Loss: Severe  Muscle Loss: Severe  Malnutrition Score: 3  -continue home Marinol   -nutrition consulted, appreciate recs  -added super shakes TID     CAD s/p CABG: aspirin, rosuvastatin   ??  Orthostatic Hypotension: Midodrine sarted on admission to [facility] with improvement in orthostatic symptoms. Long-standing history and being followed by Cardiology & Neurology for pre-syncope without etiology.  - continue midodrine 5mg  TID  ??  Hx PUD, UGIB:  protonix BID daily   ??  Anxiety: continue citalopram  - Adding trazodone PRN     Daily Checklist:  Diet: Regular Diet  DVT PPx: Lovenox 40mg  q24h  Electrolytes: No Repletion Needed  Code Status: Full Code  Dispo: TBD    Team Contact Information:   Primary Team: Oncology (MEDO)  Primary Resident: Pearline Cables, MD  Resident's Pager: [phone] (Oncology Intern - Cliffton Asters)    Interval History:   No acute events overnight.    Reported having some pain over his right rib fractures & his right hip with movement, however well-controlled with current pain regimen. Was anxious regarding treatment and reported feeling very confused.    Objective:   Temp:  [36.3 ??C-36.6 ??C] 36.3 ??C  Heart Rate:  [55-82] 60  Resp:  [14-18] 18  BP: (95-119)/(42-74) 119/52  SpO2:  [94 %-100 %] 94 %    Gen: Thin elderly gentleman in NAD, answers questions appropriately  Eyes: sclera anicteric, EOMI  HENT: atraumatic, dry mucous membranes  Heart: RRR, S1, S2, no M/R/G, tender to palpation over lower right thoracic cage  Lungs: CTAB, no crackles or wheezes, no use of accessory muscles  Abdomen: Normoactive bowel sounds, soft, NTND, no rebound/guarding  Extremities: no clubbing, cyanosis, or edema in the BLEs; dressing over R hip  Psych: Alert, orientedx4 however confused regarding timing of recent events, anxious    Labs/Studies: Labs and Studies from the last 24hrs per EMR and Reviewed

## 2021-03-01 NOTE — Unmapped (Signed)
PHYSICAL THERAPY  Evaluation (03/01/21 1610)      Patient Name:?? Douglas Gray????????   Medical Record Number: [id number]   Date of Birth: 12/07/1945  Sex: Male??????????????      Treatment Diagnosis: Fall s/p R hip fx and repair     Activity Tolerance: Limited by pain (Limited by nausea)     ASSESSMENT  Problem List: Decreased strength, Decreased range of motion, Pain, Decreased endurance, Fall Risk, Decreased mobility, Decreased cognition      Assessment : Douglas Gray is a 75 y.o. 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 Pt now s/p CMN fixation of right pathologic intertrochanteric femur fracture 02/28/21. Pt initially agreeable to attempting EOB/OOB mobility, but only bed-level evaluation able to be completed. Pt pre-medicated by RN but still with c/o significant RLE pain with very minimal movement (rolling, boosting up in bed). Pt also with an episode of nausea and vomiting when HOB lowered for boosting. Pt endorsed much frustration with his experience at his SNF PTA, and also expressed significant anxiety about attempting mobility with OT/PT and the potential for pain. Pt extensively educated re: role of PT, PT POC, importance mobility post op. At this time recommending 5x wkly low intensity rehab post discharge. Acute PT to continue to follow up to maximize safety and independence with functional mobility.      Today's Interventions: Pt extensively (and repeatedly) educated re: role of PT, PT POC, PT DC rec within scope, fall precautions, WBAT RLE, importance progressive mobility post-op                     Clinical Decision Making: Moderate      PLAN  Planned Frequency of Treatment:?? 1-2x per day for: 5-6x week       Planned Interventions: Airway Clearance, Balance activities, Education - Patient, Education - Family / caregiver, Endurance activities, Functional mobility, Investment banker, operational, Home exercise program, Self-care / Home training, Therapeutic exercise, Therapeutic activity, Transfer training     Post-Discharge Physical Therapy Recommendations:?? 5x weekly, Low intensity     PT DME Recommendations: Defer to post acute??????????       Goals:   Patient and Family Goals: Less pain with mobility and to go home     Long Term Goal #1: Pt will ambulate >150 ft with Mod I and LRAD to return to household activities within 10 weeks        SHORT GOAL #1: Pt will complete EOB assessment  ?????????????????????? Time Frame : 2 weeks  SHORT GOAL #2: Pt will complete OOB mobility assessment  ?????????????????????? Time Frame : 2 weeks     ??????????????????????       ??????????????????????       ??????????????????????       Prognosis:?? Fair     Barriers to Discharge: Endurance deficits, Pain, Inability to safely perform ADLS, Inaccessible home environment, Cognitive deficits, Functional strength deficits     SUBJECTIVE  Equipment / Environment: Vascular access (PIV, TLC, Port-a-cath, PICC), Patient not wearing mask for full session  Patient reports: Pt agreeable to PT  Current Functional Status: RN cleared pt for activity and made aware session outcome. Pt received and left reclined in bed with NAD, call bell within reach,  and all lines intact at end of session. Bed alarm on.     Prior Functional Status: Pt reports just PTA he was getting almost daily rehab at a SNF. He reports being able to ambulate short distances with assist and a RW. Pt a poor historian and unclear regarding his PLOF prior to his previous hospitalization and most recent SNF admission, but per chart review ~ 3 months ago pt ambulated community distances with Mod I and SPC. He does have a significant fall history.  Equipment available at home: [employer]      Past Medical History:   Diagnosis Date   ??? Anxiety ??? Arthritis    ??? Cataract    ??? CHF (congestive heart failure) (CMS-HCC)    ??? Chronic hypertension 02/25/2004   ??? Chronic kidney disease, stage 3, mod decreased GFR (CMS-HCC)     Cindra Presume, MD   ??? Cigarette smoker    ??? COPD (chronic obstructive pulmonary disease) (CMS-HCC)    ??? Coronary artery disease 02/25/2004    Prior CABG and coronary stents.   ??? Depression    ??? Diabetes mellitus type II, controlled, with no complications (CMS-HCC) 11/10/2008   ??? DVT (deep vein thrombosis) in pregnancy 06/28/2006   ??? Heart murmur    ??? Hemorrhage of gastrointestinal tract 01/12/2010   ??? History of nephrolithiasis 2013    with urosepsis.   ??? History of pneumonia    ??? Hx of CABG    ??? Hyperlipidemia LDL goal <70 02/25/2004   ??? Leg pain    ??? Lymphedema 06/07/2009    Recommended rest, elevation, compression stockings.   ??? Micturition frequency    ??? Myocardial infarction (CMS-HCC)    ??? Obesity 04/22/2007   ??? Peptic ulceration    ??? Peripheral artery disease (CMS-HCC) 06/28/2006   ??? Peripheral edema    ??? 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 Sinus infection     admited to [facility] med   ??? Syncope and collapse    ??? Valvular heart disease          @  Past Surgical History:   Procedure Laterality Date   ??? AVR with CABG  05/08/2007    Aortic valve replacement with a 25 mm Edwards-magna pericardial valve and saphenous vein graft to the distal right coronary artery and posterior descending artery. Jerilee Hoh, MD.   ??? BARIATRIC SURGERY  2010   ??? CARDIAC CATHETERIZATION     ??? CARDIAC VALVE REPLACEMENT     ??? CHOLECYSTECTOMY     ??? CORONARY ARTERY BYPASS GRAFT     ??? Coronary stents - LCX      3 x 24 & 3.5 x 16 Taxus   ??? CYSTOSCOPY WITH LEFT URETEROSCOPY. LASER LITHOTRIPSY AND BASKET STONE EXTRACTION  05/09/2012    M. Isaac Bliss, MD   ??? EYE SURGERY     ??? HERNIA REPAIR     ??? PR CATH PLACE/CORON ANGIO, IMG SUPER/INTERP,W LEFT HEART VENTRICULOGRAPHY N/A 03/24/2020    Procedure: LEFT HEART CATHETERIZATION WITH POSSIBLE REVASCULARIZATION; Surgeon: Lesle Chris, MD;  Location: [place] CATH;  Service: Cardiology   ??? PR COLONOSCOPY FLX DX W/COLLJ SPEC WHEN PFRMD N/A 11/28/2020    Procedure: COLONOSCOPY, FLEXIBLE, PROXIMAL TO SPLENIC FLEXURE; DIAGNOSTIC, W/WO COLLECTION SPECIMEN BY BRUSH OR WASH;  Surgeon: Beverly Milch, MD;  [facility];  Service: Gastrointestinal   ??? PR INSERTION SUBQ CARDIAC RHYTHM MONITOR W/PRGRMG N/A 04/21/2020  Procedure: LOOP RECORDER IMPLANTATION;  Surgeon: Bettye Boeck, MD;  Location: [place] CATH;  Service: Cardiology   ??? PR UPPER GI ENDOSCOPY,BIOPSY N/A 11/28/2020    Procedure: UGI ENDOSCOPY; WITH BIOPSY, SINGLE OR MULTIPLE;  Surgeon: Beverly Milch, MD;  [facility]: [facility];  Service: Gastrointestinal   ??? PR UPPER GI ENDOSCOPY,DIAGNOSIS N/A 01/27/2021    Procedure: UGI ENDO, INCLUDE ESOPHAGUS, STOMACH, & DUODENUM &/OR JEJUNUM; DX W/WO COLLECTION SPECIMN, BY BRUSH OR WASH;  Surgeon: Annie Paras, MD;  Location: [facility];  Service: Gastroenterology   ??? PRIOR BILATERAL PATELLA SURGERY  1968 & 1973   ??? VAGOTOMY            @     Allergies: Metformin, Mirtazapine, Janumet [sitagliptin-metformin], and Indomethacin                  Objective Findings  Precautions / Restrictions  Precautions: Falls precautions  Weight Bearing Status: RLE WBAT - Weight bearing as tolerated  Required Braces or Orthoses: Non-applicable     Communication Preference: Verbal          Pain Comments: RN pre-medicated patient for pain at start of evaluation. Pt still with c/o 10/10 RLE pain with movement despite pre-medication. RN at bedside at end of evaluation and made aware.     Equipment / Environment: Patient not wearing mask for full session, Vascular access (PIV, TLC, Port-a-cath, PICC)     At Rest: BP 106/55, HR 72, SpO2 100% RA  With Activity: NAD           Living Situation  Living Environment: House (Pt admitted from SNF/rehab)  Lives With: Spouse  Home Living: One level home, Level entry, Grab bars in shower, Walk-in shower, Built-in shower seat, Shower chair with back      Cognition  Cognition comment: Questionable historian. Tangential. Disoriented to month.        Skin Inspection: Dressing C/D/I  Skin Inspection comment: Dressing to R lateral thigh     UE ROM / Strength  UE ROM/Strength: Left WFL, Right WFL  LE ROM / Strength  LE ROM/Strength: Left Impaired/Limited, Right Impaired/Limited  RLE Impairment: Reduced strength, Pain with movement  LLE Impairment: Reduced strength                  Transfers  Transfers: Rolling Right, Rolling Left, Supine to Sit, Sit to Stand  Rolling right assistance level: Verbal Cues, 25% or less physical assistance, Adaptive Equipment  Rolling left assistance level: Verbal Cues, 25% or less physical assistance, Adaptive Equipment  Supine to Sit comments: Unable to attempt 2/2 pt pain, nausea, and pt endorsed significant anxiety with mobilizing today  Sit to Stand comments: Unable to attempt 2/2 pt pain, nausea, and pt endorsed significant anxiety with mobilizing today      Gait  Gait: Unable to attempt 2/2 pt pain, nausea, and pt endorsed significant anxiety with mobilizing today                  Endurance: Poor     Physical Therapy Session Duration  PT Individual [mins]: 42  Reason for Co-treatment: Poor pain control, Poor activity tolerance           I attest that I have reviewed the above information.  Signed: Hilaria Ota, PT  Filed 03/01/2021

## 2021-03-01 NOTE — Unmapped (Signed)
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 Anxiety    ??? Arthritis    ??? Cataract    ??? CHF (congestive heart failure) (CMS-HCC)    ??? Chronic hypertension 02/25/2004   ??? Chronic kidney disease, stage 3, mod decreased GFR (CMS-HCC)     Cindra Presume, MD   ??? Cigarette smoker    ??? COPD (chronic obstructive pulmonary disease) (CMS-HCC)    ??? Coronary artery disease 02/25/2004    Prior CABG and coronary stents.   ??? Depression    ??? Diabetes mellitus type II, controlled, with no complications (CMS-HCC) 11/10/2008   ??? DVT (deep vein thrombosis) in pregnancy 06/28/2006   ??? Heart murmur    ??? Hemorrhage of gastrointestinal tract 01/12/2010   ??? History of nephrolithiasis 2013    with urosepsis.   ??? History of pneumonia    ??? Hx of CABG    ??? Hyperlipidemia LDL goal <70 02/25/2004   ??? Leg pain    ??? Lymphedema 06/07/2009    Recommended rest, elevation, compression stockings.   ??? Micturition frequency    ??? Myocardial infarction (CMS-HCC)    ??? Obesity 04/22/2007   ??? Peptic ulceration    ??? Peripheral artery disease (CMS-HCC) 06/28/2006   ??? Peripheral edema    ??? 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 Sinus infection     admited to [facility] med   ??? Syncope and collapse    ??? Valvular heart disease          Anthropometric Data:  Height:   180.3cm  Admission weight:  pending  Last recorded weight:  74.8kg  IBW:  78.1kg  Percent IBW:  95%  BMI: 23  Usual Body Weight: 165-180#    Weight history prior to admission: Weight gain since 12/19/20 to 01/27/21; Currently Pending new weight status   Wt Readings from Last 10 Encounters:   01/27/21 74.8 kg (165 lb)   01/16/21 71.3 kg (157 lb 3.2 oz)   12/19/20 68.3 kg (150 lb 8 oz)   11/30/20 72.6 kg (160 lb 0.9 oz)   11/25/20 72.6 kg (160 lb 0.9 oz)   05/25/20 86 kg (189 lb 8 oz)   04/21/20 84.1 kg (185 lb 6.5 oz)   04/08/20 84.9 kg (187 lb 3.2 oz)   04/08/20 85.3 kg (188 lb)   03/24/20 83.9 kg (184 lb 15.5 oz)        Weight changes this admission: There were no vitals filed for this visit.     Nutrition Focused Physical Exam: Limited exam, Pt tired but obvious losses observed in other areas not examined   Fat Areas Examined  Orbital: Severe loss    Muscle Areas Examined  Temple: Severe loss  Clavicle: Severe loss  Acromion: Severe loss  Dorsal Hand: Severe loss     Nutrition Evaluation  Nutrition Designation: Normal weight (BMI 18.50 - 24.99 kg/m2) (03/01/21 0955)  NUTRITIONALLY RELEVANT DATA     Medications:   Nutritionally pertinent medications reviewed and evaluated for potential food and/or medication interactions.     Labs:   Nutritionally pertinent labs reviewed.  POC Glucose 97  Lab Results   Component Value Date    NA 132 (L) 03/01/2021    K 3.8 03/01/2021    CL 101 03/01/2021    CO2 24.0 03/01/2021    BUN 49 (H) 03/01/2021    CREATININE 1.68 (H) 03/01/2021    GFR 43.0000 10/13/2015    GLU 222 (H) 03/01/2021    CALCIUM 7.9 (L) 03/01/2021    ALBUMIN 1.7 (L) 03/01/2021    PHOS 4.8 03/01/2021         Nutrition History:   March 01, 2021: Prior to admission: Pt reports his appetite has been low for weeks especially after his fall causing rib fx's (over 3 weeks). Pt was unsure if he was on marinol at facility but reports appetite about same. Pt was unable to give specifics.  He reports weight has been usually 165-180# but think it is less than this now. He was unable to quantify current weight or specifics of food intake. Observed Pt eating english muffin and jello for breakfast, Pt having difficulty making through with fatigue and just holding cup of jello.  He historically dislikes taste of commercial supplements but did enjoy milkshake and ice cream yesterday.    Allergies, Intolerances, Sensitivities, and/or Cultural/Religious Dietary Restrictions: none identified at this time     Current Nutrition:  Oral intake        Nutrition Orders   (From admission, onward)             Start     Ordered    02/28/21 1639  Nutrition Therapy Regular/House  Effective now        Question:  Nutrition Therapy:  Answer:  Regular/House    02/28/21 1638                   Nutritional Needs:     Energy: 2956-2130 kcals 25-30 kcal/kg using last recorded weight, 74 kg (03/01/21 0956)]  Protein: 89-111 gm [1.2-1.5 gm/kg using last recorded weight, 74 kg (03/01/21 0956)]  Carbohydrate:   [no restriction]  Fluid: 1850-2220 mL [1 mL/kcal (maintenance)]      Malnutrition Assessment using AND/ASPEN Clinical Characteristics:    Severe Protein-Calorie Malnutrition in the context of chronic illness (03/01/21 0955)  Energy Intake: < or equal to 75% of estimated energy requirement for > or equal to 1 month  Fat Loss: Severe  Muscle Loss: Severe  Malnutrition Score: 3      GOALS and EVALUATION     ??? Patient to consume 75% or greater of po intake via combination of meals, snacks, and/or oral supplements within week.  - New    Motivation, Barriers, and Compliance:  Evaluation of motivation, barriers, and compliance pending at this time due to clinical status.     NUTRITION ASSESSMENT     ??? May continue vitamin c and d supplements for cardiac and bone health  ??? Dronabinol med continued from facility, unclear length of time Pt been on this but will monitor for po intake improvements  ??? Pt would benefit from high cal/protein nourishment based on his po preferences      Discharge Planning:   Monitor via CAPP rounds for any discharge planning needs.      NUTRITION INTERVENTIONS and RECOMMENDATION     1. Continue regular diet,  assist with meal set-up all meals  2. Added super shake choc TID  3. Continue dronabinol med  4. Continue regular weight checks    Follow-Up Parameters:   1-2 times per week (and more frequent as indicated)    Ed Blalock, MS, RD, CSO, LDN  Pager # [phone]

## 2021-03-02 LAB — BASIC METABOLIC PANEL
ANION GAP: 10 mmol/L (ref 5–14)
ANION GAP: 12 mmol/L (ref 5–14)
BLOOD UREA NITROGEN: 52 mg/dL — ABNORMAL HIGH (ref 9–23)
BLOOD UREA NITROGEN: 31 mg/dL — ABNORMAL HIGH (ref 9–23)
BUN / CREAT RATIO: 21
BUN / CREAT RATIO: 13
CALCIUM: 7.9 mg/dL — ABNORMAL LOW (ref 8.7–10.4)
CALCIUM: 7.8 mg/dL — ABNORMAL LOW (ref 8.7–10.4)
CHLORIDE: 105 mmol/L (ref 98–107)
CHLORIDE: 106 mmol/L (ref 98–107)
CO2: 22 mmol/L (ref 20.0–31.0)
CO2: 15 mmol/L — ABNORMAL LOW (ref 20.0–31.0)
CREATININE: 2.46 mg/dL — ABNORMAL HIGH
CREATININE: 2.35 mg/dL — ABNORMAL HIGH
EGFR CKD-EPI AA MALE: 29 mL/min/{1.73_m2} — ABNORMAL LOW (ref >=60)
EGFR CKD-EPI AA MALE: 30 mL/min/{1.73_m2} — ABNORMAL LOW (ref >=60)
EGFR CKD-EPI NON-AA MALE: 25 mL/min/{1.73_m2} — ABNORMAL LOW (ref >=60)
EGFR CKD-EPI NON-AA MALE: 26 mL/min/{1.73_m2} — ABNORMAL LOW (ref >=60)
GLUCOSE RANDOM: 87 mg/dL (ref 70–179)
GLUCOSE RANDOM: 80 mg/dL (ref 70–179)
POTASSIUM: 4 mmol/L (ref 3.4–4.8)
SODIUM: 137 mmol/L (ref 135–145)
SODIUM: 133 mmol/L — ABNORMAL LOW (ref 135–145)

## 2021-03-02 LAB — CBC W/ AUTO DIFF
BASOPHILS ABSOLUTE COUNT: 0 10*9/L (ref 0.0–0.1)
BASOPHILS RELATIVE PERCENT: 0.3 %
EOSINOPHILS ABSOLUTE COUNT: 0 10*9/L (ref 0.0–0.5)
EOSINOPHILS RELATIVE PERCENT: 0.3 %
HEMATOCRIT: 32.7 % — ABNORMAL LOW (ref 39.0–48.0)
HEMOGLOBIN: 10.7 g/dL — ABNORMAL LOW (ref 12.9–16.5)
LYMPHOCYTES ABSOLUTE COUNT: 1.1 10*9/L (ref 1.1–3.6)
LYMPHOCYTES RELATIVE PERCENT: 9.8 %
MEAN CORPUSCULAR HEMOGLOBIN CONC: 32.8 g/dL (ref 32.0–36.0)
MEAN CORPUSCULAR HEMOGLOBIN: 29 pg (ref 25.9–32.4)
MEAN CORPUSCULAR VOLUME: 88.5 fL (ref 77.6–95.7)
MEAN PLATELET VOLUME: 7.7 fL (ref 6.8–10.7)
MONOCYTES ABSOLUTE COUNT: 0.6 10*9/L (ref 0.3–0.8)
MONOCYTES RELATIVE PERCENT: 5.7 %
NEUTROPHILS ABSOLUTE COUNT: 9 10*9/L — ABNORMAL HIGH (ref 1.8–7.8)
NEUTROPHILS RELATIVE PERCENT: 83.9 %
PLATELET COUNT: 204 10*9/L (ref 150–450)
RED BLOOD CELL COUNT: 3.69 10*12/L — ABNORMAL LOW (ref 4.26–5.60)
RED CELL DISTRIBUTION WIDTH: 16.9 % — ABNORMAL HIGH (ref 12.2–15.2)
WBC ADJUSTED: 10.8 10*9/L (ref 3.6–11.2)

## 2021-03-02 LAB — URINALYSIS
BACTERIA: NONE SEEN /HPF
BILIRUBIN UA: NEGATIVE
BLOOD UA: NEGATIVE
GLUCOSE UA: NEGATIVE
GRANULAR CASTS: 1 /LPF — ABNORMAL HIGH
HYALINE CASTS: 3 /LPF — ABNORMAL HIGH (ref 0–1)
LEUKOCYTE ESTERASE UA: NEGATIVE
NITRITE UA: NEGATIVE
PH UA: 5 (ref 5.0–9.0)
RBC UA: 2 /HPF (ref <=3)
SPECIFIC GRAVITY UA: 1.015 (ref 1.003–1.030)
SQUAMOUS EPITHELIAL: 1 /HPF (ref 0–5)
UROBILINOGEN UA: 0.2
WBC UA: 1 /HPF (ref <=2)

## 2021-03-02 LAB — HEPATIC FUNCTION PANEL
ALBUMIN: 1.9 g/dL — ABNORMAL LOW (ref 3.4–5.0)
ALKALINE PHOSPHATASE: 730 U/L — ABNORMAL HIGH (ref 46–116)
ALT (SGPT): <7 U/L — ABNORMAL LOW (ref 10–49)
AST (SGOT): 14 U/L (ref <=34)
BILIRUBIN DIRECT: 0.2 mg/dL (ref 0.00–0.30)
BILIRUBIN TOTAL: 0.4 mg/dL (ref 0.3–1.2)
PROTEIN TOTAL: 4.5 g/dL — ABNORMAL LOW (ref 5.7–8.2)

## 2021-03-02 LAB — MAGNESIUM: MAGNESIUM: 1.6 mg/dL (ref 1.6–2.6)

## 2021-03-02 LAB — CK: CREATINE KINASE TOTAL: 63 U/L

## 2021-03-02 LAB — VITAMIN D 25 HYDROXY: VITAMIN D, TOTAL (25OH): 35.9 ng/mL (ref 20.0–80.0)

## 2021-03-02 LAB — SODIUM, URINE, RANDOM: SODIUM URINE: 17 mmol/L

## 2021-03-02 LAB — CREATININE, URINE: CREATININE, URINE: 99 mg/dL

## 2021-03-02 LAB — PHOSPHORUS: PHOSPHORUS: 5 mg/dL (ref 2.4–5.1)

## 2021-03-02 MED ADMIN — acetaminophen (TYLENOL) tablet 1,000 mg: 1000 mg | ORAL | @ 18:00:00

## 2021-03-02 MED ADMIN — senna (SENOKOT) tablet 2 tablet: 2 | ORAL

## 2021-03-02 MED ADMIN — acetaminophen (TYLENOL) tablet 1,000 mg: 1000 mg | ORAL | Stop: 2021-03-01

## 2021-03-02 MED ADMIN — pantoprazole (PROTONIX) EC tablet 40 mg: 40 mg | ORAL

## 2021-03-02 MED ADMIN — oxyCODONE (ROXICODONE) immediate release tablet 10 mg: 10 mg | ORAL | @ 10:00:00 | Stop: 2021-03-14

## 2021-03-02 MED ADMIN — dronabinoL (MARINOL) capsule 5 mg: 5 mg | ORAL | @ 13:00:00

## 2021-03-02 MED ADMIN — pantoprazole (PROTONIX) EC tablet 40 mg: 40 mg | ORAL | @ 13:00:00

## 2021-03-02 MED ADMIN — lactated ringers bolus 500 mL: 500 mL | INTRAVENOUS | @ 19:00:00 | Stop: 2021-03-02

## 2021-03-02 MED ADMIN — Relugolix (ORGOVYX) tablet 120 mg **PATIENT SUPPLIED**: 120 mg | ORAL | @ 13:00:00

## 2021-03-02 MED ADMIN — citalopram (CeleXA) tablet 20 mg: 20 mg | ORAL | @ 19:00:00

## 2021-03-02 MED ADMIN — midodrine (PROAMATINE) tablet 5 mg: 5 mg | ORAL | @ 13:00:00

## 2021-03-02 MED ADMIN — enoxaparin (LOVENOX) syringe 40 mg: 40 mg | SUBCUTANEOUS | @ 13:00:00 | Stop: 2021-03-02

## 2021-03-02 MED ADMIN — Relugolix (ORGOVYX) tablet 120 mg **PATIENT SUPPLIED**: 120 mg | ORAL

## 2021-03-02 MED ADMIN — midodrine (PROAMATINE) tablet 5 mg: 5 mg | ORAL

## 2021-03-02 MED ADMIN — ascorbic acid (vitamin C) (VITAMIN C) tablet 500 mg: 500 mg | ORAL | @ 13:00:00

## 2021-03-02 MED ADMIN — midodrine (PROAMATINE) tablet 5 mg: 5 mg | ORAL | @ 18:00:00

## 2021-03-02 MED ADMIN — oxyCODONE (ROXICODONE) immediate release tablet 10 mg: 10 mg | ORAL | @ 14:00:00 | Stop: 2021-03-14

## 2021-03-02 MED ADMIN — oxyCODONE (ROXICODONE) immediate release tablet 10 mg: 10 mg | ORAL | @ 02:00:00 | Stop: 2021-03-14

## 2021-03-02 MED ADMIN — oxyCODONE (OXYCONTIN) 12 hr crush resistant ER/CR tablet 10 mg: 10 mg | ORAL | Stop: 2021-03-14

## 2021-03-02 MED ADMIN — acetaminophen (TYLENOL) tablet 1,000 mg: 1000 mg | ORAL | @ 10:00:00

## 2021-03-02 MED ADMIN — aspirin chewable tablet 81 mg: 81 mg | ORAL | @ 13:00:00

## 2021-03-02 MED ADMIN — lactated ringers bolus 500 mL: 500 mL | INTRAVENOUS | @ 14:00:00 | Stop: 2021-03-02

## 2021-03-02 MED ADMIN — oxyCODONE (OXYCONTIN) 12 hr crush resistant ER/CR tablet 10 mg: 10 mg | ORAL | @ 13:00:00 | Stop: 2021-03-02

## 2021-03-02 MED ADMIN — dronabinoL (MARINOL) capsule 5 mg: 5 mg | ORAL | @ 21:00:00

## 2021-03-02 NOTE — Unmapped (Signed)
Orthopaedics Progress Note    Assessment and Plan:  75 y.o. male s/p CMN fixation of right pathologic intertrochanteric femur fracture 02/28/21    ??? Okay from orthopaedic perspective to consider palliative radiation to proximal femur and pelvic metastasis  ??? DVT Prophylaxis: Lovenox 40 mg daily, SCDs, ambulation.  ??? Weightbearing as tolerated.  ??? Multimodal Pain control. Transition off narcotic and IV medications as tolerated.  ??? Antibiotics: surgical prophylaxis indicated for 23 hrs postoperatively.  ??? Dressing to be changed POD3   by MD then PRN by nursing.   ??? Therapy: PT/OT  ??? Disposition: Per primary. Okay to discharge from orthopaedic standpoint. We will arrange follow-up after discharge.     Subjective  No acute events overnight.  Says pain is controlled.  Afebrile.  Hgb stable 10.7.    Objective  Vitals: Blood pressure 111/47, pulse 57, temperature 36.4 ??C, temperature source Oral, resp. rate 18, weight 62.7 kg (138 lb 3.7 oz), SpO2 93 %.  General Appearance: Awake and alert   MSK exam:(RLE):    Cardiovascular:   Palpable DP pulse.    Neuro/Compartments:   RLE: Dressing clean, dry, intact, no strikethrough.  Patient fires GS, TA, EHL.  Patient sensation light touch is the same compared to contralateral extremity letter from first webspace, dorsal plantar aspect of foot, medial lateral aspect of foot (though says there is subjective tingling on the medial and the dorsal part of foot despite feeling the same to light touch).  Compartment soft compressible.  No pain with passive stretch.  DP pulse 2+.        Contact  Please contact Clement Sayres, APP [phone]) 6am-3pm Tuesday-Friday  On nights (6pm-6am), weekends, and holidays, please page the Consult pager.  On Mondays please contact the person who leaves daily progress notes.

## 2021-03-02 NOTE — Unmapped (Signed)
Oncology ([facility]O) Progress Note    Assessment & Plan:   Douglas Gray is a 75 y.o. 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 *      Right??pathologic greater trochanteric femur fracture with possible intertrochanteric extension: History of progressive R hip pain over the last 6-7 months. Found to have a R femur fracture on admission, reportedly new from 3/25 R femur XR completed at [facility]. S/p fixation on 4/6. Pain scores listed as 8's. Patient reports pain as 8 at rest, increased with breathing, due to rib fractures.  -Pain control: home oxycontin 10mg  BID -> 20mg  BID (iso of decreased renal clearance), oxycodone prn, IV dilaudid 1mg  for breakthrough   -Post-op ancef as per Ortho for 24hrs completed  -RadOnc consult for post-op palliative radiation, appreciate assistance: to be arranged outpatient  ??  Pathologic Right Rib Fractures I Small Pneumothorax   Recent admission to [facility] for R 7th and 8th rib fractures with associated pneumothorax (3/16-3/25). Imaging on admission includes additional acute right rib displaced fractures now including at least the 6-10th posterior ribs with small associated pneumothorax and small volume pleural effusion the right hemithorax. No oxygen requirement   -pain control   -continuous pulse oximetry   -supplemental oxygen for SpO2 >90%   -eval for CXR as needed  ??  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 Now on ADT s/p degarelix 11/2020 and will changed to relugolix 01/17/2021 and enzalutamide 02/01/21  (per ENZAMET and ARCHES). Enzalutamide on hold as possibly contributing to falls.  -notified Dr. Okey Dupre of admission   -wife brought in relugolix (orgovyx) from home  -home Vit D/Calcium supplement    ??  AKI CKD IIIb: Baseline Cr 1.2-1.4. Cr 1.68 yesterday increased to 2.46 this AM. FeNa 0.3% so pre-renal, likely from decreased PO intake & contrast-induced nephropathy (CT on 4/4).   -Will give gentle & small boluses due to grade 2 diastolic dysfunction and to decrease lines/connections due to ongoing confusion/delirium.   -Repeat afternoon BMP after fluids  -avoid nephrotoxic medications     Delirium  - Delirium precautions  - Sleep holiday  - Decrease # of unnecessary lines  ??  Severe Protein-Calorie Malnutrition in the context of chronic illness (03/01/21 0955)  Energy Intake: < or equal to 75% of estimated energy requirement for > or equal to 1 month  Fat Loss: Severe  Muscle Loss: Severe  Malnutrition Score: 3  -continue home Marinol   -nutrition consulted, appreciate recs  -added super shakes TID    Anxiety:   - Increased  home citalopram 20mg  q48h to 20mg  daily for therapeutic dose (latest RX written as 40mg  daily however SNF medication list reports 20mg  q48h)  - Monitor for AE's with above change  - Trazodone PRN        BLE Edema: negative dopplers during admission to [facility] on 3/25. Worse in left ankle, XR negative for fracture.   -hold daily lasix 40mg  daily in setting of hypotension     CAD s/p CABG: aspirin, rosuvastatin   ??  Orthostatic Hypotension: Midodrine sarted on admission to [facility] with improvement in orthostatic symptoms. Long-standing history and being followed by Cardiology & Neurology for pre-syncope without etiology.  - continue midodrine 5mg  TID  ??  Hx PUD, UGIB: protonix BID daily   ??    Daily Checklist:  Diet: Regular Diet  DVT PPx: Lovenox 40mg  q24h  Electrolytes: No Repletion Needed  Code Status: Full Code  Dispo: TBD    Team Contact Information:   Primary Team: Oncology (MEDO)  Primary Resident: Pearline Cables, MD  Resident's Pager: [phone] (Oncology Intern - Cliffton Asters)    Interval History:   No acute events overnight.    Reports pain as 8 while resting in bed, mostly over R rib fractures. Reporting frustration at being confused regarding timeline of events, forgetting his wife's phone number.    Objective:   Temp:  [36.3 ??C-36.6 ??C] 36.3 ??C  Heart Rate:  [57-68] 61  Resp:  [18] 18  BP: (100-119)/(47-55) 106/47  SpO2:  [93 %-98 %] 95 %    Gen: Thin elderly gentleman in NAD, answers questions appropriately  Eyes: sclera anicteric, EOMI  HENT: atraumatic, dry mucous membranes  Heart: RRR, S1, S2, no M/R/G, tender to palpation over lower right thoracic cage  Lungs: CTAB, no crackles or wheezes, no use of accessory muscles  Abdomen: Normoactive bowel sounds, soft, NTND, no rebound/guarding  Extremities: no clubbing, cyanosis, or edema in the BLEs; dressing over R hip  Psych: Alert, oriented to self, location, year but not to president (reports Tim Lair), unable to spell WORLD backwards, unable to perform serial 7's, anxious    Labs/Studies: Labs and Studies from the last 24hrs per EMR and Reviewed

## 2021-03-02 NOTE — Unmapped (Addendum)
Pt alert and oriented, confused and forgetful. VSS, no falls and afebrile. No acute events overnight. Pt received PRN oxy x2 for pain. Safety measures remain in place with bed low, brakes locked, side rails up, call bell within reach, non-skid footwear when out of bed. Will continue to monitor.

## 2021-03-02 NOTE — Unmapped (Signed)
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  ??  CWOCN consulted for sacral pressure injury and skin tears to L arm, both present on admission. Upon assessment, L arm skin tear oozing serosanguinous drainage with pink/red wound bed, RNs appropriately dressing with silicone foam dressing. Added hydrophilic gauze today to promote moist wound healing with atraumatic removal.     Pt has scar tissue to the sacrum indicative of prior healed pressure injuries, wounds noted today with thin layer of slough tissue with subcutaneous fat Wound is a stage 3 at this time. Silicone foam bordered dressing replaced, RNs to turn pt L and R only. RN updated on POC at the bedside.    No PIs noted to bilateral heels today, pillows placed under calves to offload. CFCN consult placed for toenail trimming per pt request.        03/02/21 1145   Wound 02/28/21 Pressure Injury Sacrum Mid Stage 3   Date First Assessed/Time First Assessed: 02/28/21 1330   Present on Hospital Admission: Yes  Primary Wound Type: Pressure Injury  Location: Sacrum  Wound Location Orientation: Mid  Staging: Stage 3  Medical Device Related Pressure Injury: No  Staged B...   Wound Image    Dressing Status      Changed   Wound Length (cm) 4 cm   Wound Width (cm) 5 cm   Wound Depth (cm) 0.1 cm   Wound Surface Area (cm^2) 20 cm^2   Wound Volume (cm^3) 2 cm^3   Wound Bed Pink;Tan   Odor None   Peri-wound Assessment      Blanchable erythema;Intact;Scar tissue   Exudate Type      Serous   Exudate Amnt      Scant   Treatments Cleansed/Irrigation   Dressing Silicone foam bordered dressing   Wound 02/28/21 Skin Tear Arm Anterior;Left;Lower;Proximal   Date First Assessed/Time First Assessed: 02/28/21 1330   Present on Hospital Admission: (c)   Primary Wound Type: Skin Tear  Location: Arm  Wound Location Orientation: Anterior;Left;Lower;Proximal   Wound Image    Dressing Status      Changed   Wound Length (cm) 2 cm   Wound Width (cm) 5  cm   Wound Surface Area (cm^2) 10 cm^2   Wound Bed Pink;Red   Odor None   Peri-wound Assessment      Ecchymosis;Intact   Exudate Type      Sero-sanguineous   Exudate Amnt      Small   Treatments Cleansed/Irrigation   Dressing Hydrophilic dressing;Silicone foam bordered dressing     Continence Status:   Continent    Moisture Associated Skin Damage:   - Incontinence-associated dermatitis (IAD)     Lab Results   Component Value Date    WBC 10.8 03/02/2021    HGB 10.7 (L) 03/02/2021    HCT 32.7 (L) 03/02/2021    A1C 5.2 11/24/2020    GLUF 73 12/05/2012    GLU 87 03/02/2021    POCGLU 97 02/28/2021    ALBUMIN 1.9 (L) 03/02/2021    PROT 4.5 (L) 03/02/2021     Risk Factors:   - Aging  - Extended Hospitalization  - Immobility  - Moisture  - Multiple co-morbidities  - Nutritional Deficit    Braden Scale Score: 14       Support Surface:   - Low Air Loss    Type Debridement Completed By WOCN:  N/A    Teaching:  - Routine skin care  - Turning and repositioning  - Wound care    WOCN Recommendations:   - See nursing orders for wound care instructions.  - Contact WOCN with questions, concerns, or wound deterioration.    Topical Therapy/Interventions:   - Hydrophilic Gauze  - Silicone bordered foam    Recommended Consults:  - Certified Foot Care Nurse    WOCN Follow Up:  - Weekly    Plan of Care Discussed With:   - Patient  - RN primary    Supplies Ordered: No    Workup Time:   45 minutes     Ann (Abenezer Odonell-Chia) Henderson Newcomer RN, BSN CWOCN CFCN  [phone]  Phone- [phone]

## 2021-03-03 ENCOUNTER — Ambulatory Visit: Admit: 2021-03-03 | Payer: MEDICARE

## 2021-03-03 LAB — BASIC METABOLIC PANEL
ANION GAP: 9 mmol/L (ref 5–14)
BLOOD UREA NITROGEN: 54 mg/dL — ABNORMAL HIGH (ref 9–23)
BUN / CREAT RATIO: 26
CALCIUM: 7.8 mg/dL — ABNORMAL LOW (ref 8.7–10.4)
CHLORIDE: 107 mmol/L (ref 98–107)
CO2: 21 mmol/L (ref 20.0–31.0)
CREATININE: 2.09 mg/dL — ABNORMAL HIGH
EGFR CKD-EPI AA MALE: 35 mL/min/{1.73_m2} — ABNORMAL LOW (ref >=60)
EGFR CKD-EPI NON-AA MALE: 30 mL/min/{1.73_m2} — ABNORMAL LOW (ref >=60)
GLUCOSE RANDOM: 60 mg/dL — ABNORMAL LOW (ref 70–179)
POTASSIUM: 3.8 mmol/L (ref 3.4–4.8)
SODIUM: 137 mmol/L (ref 135–145)

## 2021-03-03 LAB — CBC W/ AUTO DIFF
BASOPHILS ABSOLUTE COUNT: 0 10*9/L (ref 0.0–0.1)
BASOPHILS RELATIVE PERCENT: 0.2 %
EOSINOPHILS ABSOLUTE COUNT: 0 10*9/L (ref 0.0–0.5)
EOSINOPHILS RELATIVE PERCENT: 0.3 %
HEMATOCRIT: 30.2 % — ABNORMAL LOW (ref 39.0–48.0)
HEMOGLOBIN: 10 g/dL — ABNORMAL LOW (ref 12.9–16.5)
LYMPHOCYTES ABSOLUTE COUNT: 0.9 10*9/L — ABNORMAL LOW (ref 1.1–3.6)
LYMPHOCYTES RELATIVE PERCENT: 9.2 %
MEAN CORPUSCULAR HEMOGLOBIN CONC: 33.2 g/dL (ref 32.0–36.0)
MEAN CORPUSCULAR HEMOGLOBIN: 29.3 pg (ref 25.9–32.4)
MEAN CORPUSCULAR VOLUME: 88.1 fL (ref 77.6–95.7)
MEAN PLATELET VOLUME: 7.8 fL (ref 6.8–10.7)
MONOCYTES ABSOLUTE COUNT: 0.5 10*9/L (ref 0.3–0.8)
MONOCYTES RELATIVE PERCENT: 5.1 %
NEUTROPHILS ABSOLUTE COUNT: 8.5 10*9/L — ABNORMAL HIGH (ref 1.8–7.8)
NEUTROPHILS RELATIVE PERCENT: 85.2 %
PLATELET COUNT: 192 10*9/L (ref 150–450)
RED BLOOD CELL COUNT: 3.43 10*12/L — ABNORMAL LOW (ref 4.26–5.60)
RED CELL DISTRIBUTION WIDTH: 17.3 % — ABNORMAL HIGH (ref 12.2–15.2)
WBC ADJUSTED: 9.9 10*9/L (ref 3.6–11.2)

## 2021-03-03 LAB — HEPATIC FUNCTION PANEL
ALBUMIN: 1.8 g/dL — ABNORMAL LOW (ref 3.4–5.0)
ALKALINE PHOSPHATASE: 655 U/L — ABNORMAL HIGH (ref 46–116)
ALT (SGPT): <7 U/L — ABNORMAL LOW (ref 10–49)
AST (SGOT): 14 U/L (ref <=34)
BILIRUBIN DIRECT: 0.3 mg/dL (ref 0.00–0.30)
BILIRUBIN TOTAL: 0.5 mg/dL (ref 0.3–1.2)
PROTEIN TOTAL: 4.3 g/dL — ABNORMAL LOW (ref 5.7–8.2)

## 2021-03-03 LAB — MAGNESIUM: MAGNESIUM: 1.6 mg/dL (ref 1.6–2.6)

## 2021-03-03 LAB — PHOSPHORUS: PHOSPHORUS: 3.9 mg/dL (ref 2.4–5.1)

## 2021-03-03 LAB — POTASSIUM: POTASSIUM: 3.8 mmol/L (ref 3.4–4.8)

## 2021-03-03 MED ADMIN — aspirin chewable tablet 81 mg: 81 mg | ORAL | @ 14:00:00

## 2021-03-03 MED ADMIN — senna (SENOKOT) tablet 2 tablet: 2 | ORAL | @ 01:00:00

## 2021-03-03 MED ADMIN — dronabinoL (MARINOL) capsule 5 mg: 5 mg | ORAL | @ 14:00:00

## 2021-03-03 MED ADMIN — oxyCODONE (OXYCONTIN) 12 hr crush resistant ER/CR tablet 20 mg: 20 mg | ORAL | @ 01:00:00 | Stop: 2021-03-14

## 2021-03-03 MED ADMIN — citalopram (CeleXA) tablet 20 mg: 20 mg | ORAL | @ 14:00:00

## 2021-03-03 MED ADMIN — acetaminophen (TYLENOL) tablet 1,000 mg: 1000 mg | ORAL | @ 19:00:00

## 2021-03-03 MED ADMIN — dronabinoL (MARINOL) capsule 5 mg: 5 mg | ORAL | @ 22:00:00

## 2021-03-03 MED ADMIN — melatonin tablet 3 mg: 3 mg | ORAL | @ 01:00:00

## 2021-03-03 MED ADMIN — pantoprazole (PROTONIX) EC tablet 40 mg: 40 mg | ORAL | @ 14:00:00

## 2021-03-03 MED ADMIN — ascorbic acid (vitamin C) (VITAMIN C) tablet 500 mg: 500 mg | ORAL | @ 14:00:00

## 2021-03-03 MED ADMIN — oxyCODONE (OXYCONTIN) 12 hr crush resistant ER/CR tablet 20 mg: 20 mg | ORAL | @ 14:00:00 | Stop: 2021-03-14

## 2021-03-03 MED ADMIN — midodrine (PROAMATINE) tablet 5 mg: 5 mg | ORAL | @ 01:00:00

## 2021-03-03 MED ADMIN — midodrine (PROAMATINE) tablet 5 mg: 5 mg | ORAL | @ 19:00:00

## 2021-03-03 MED ADMIN — acetaminophen (TYLENOL) tablet 1,000 mg: 1000 mg | ORAL | @ 01:00:00

## 2021-03-03 MED ADMIN — midodrine (PROAMATINE) tablet 5 mg: 5 mg | ORAL | @ 14:00:00

## 2021-03-03 MED ADMIN — pantoprazole (PROTONIX) EC tablet 40 mg: 40 mg | ORAL | @ 01:00:00

## 2021-03-03 MED ADMIN — enoxaparin (LOVENOX) syringe 30 mg: 30 mg | SUBCUTANEOUS | @ 14:00:00

## 2021-03-03 MED ADMIN — acetaminophen (TYLENOL) tablet 1,000 mg: 1000 mg | ORAL | @ 11:00:00

## 2021-03-03 NOTE — Unmapped (Signed)
Pt AxOx3, disoriented to time, intermitently forgetful and confused at baseline. No c/o auditory hallucinations overnight. Sleep holiday in place to help with delirium from 10 pm - 6 am. Q2 turns completed, L/R only, no supine. Pt resting comfortably overnight with safety measures in place.     Problem: Skin Injury Risk Increased  Goal: Skin Health and Integrity  Outcome: Ongoing - Unchanged  Intervention: Optimize Skin Protection  Recent Flowsheet Documentation  Taken 03/02/2021 1920 by Azzie Almas, RN  Pressure Reduction Techniques:  ??? frequent weight shift encouraged  ??? heels elevated off bed  ??? weight shift assistance provided  Head of Bed (HOB) Positioning: HOB elevated  Pressure Reduction Devices: pressure-redistributing mattress utilized  Skin Protection: adhesive use limited     Problem: Impaired Wound Healing  Goal: Optimal Wound Healing  Outcome: Ongoing - Unchanged  Intervention: Promote Wound Healing  Recent Flowsheet Documentation  Taken 03/02/2021 1920 by Azzie Almas, RN  Activity Management:  ??? activity adjusted per tolerance  ??? bedrest     Problem: Fall Injury Risk  Goal: Absence of Fall and Fall-Related Injury  Outcome: Ongoing - Unchanged  Intervention: Promote Injury-Free Environment  Recent Flowsheet Documentation  Taken 03/03/2021 0009 by Azzie Almas, RN  Safety Interventions:  ??? bed alarm  ??? commode/urinal/bedpan at bedside  ??? lighting adjusted for tasks/safety  ??? low bed  Taken 03/02/2021 2220 by Azzie Almas, RN  Safety Interventions:  ??? bed alarm  ??? commode/urinal/bedpan at bedside  ??? lighting adjusted for tasks/safety  ??? low bed  Taken 03/02/2021 2030 by Azzie Almas, RN  Safety Interventions:  ??? bed alarm  ??? fall reduction program maintained  ??? lighting adjusted for tasks/safety  ??? low bed  Taken 03/02/2021 1920 by Azzie Almas, RN  Safety Interventions:  ??? bed alarm  ??? commode/urinal/bedpan at bedside  ??? fall reduction program maintained  ??? lighting adjusted for tasks/safety  ??? low bed  ??? nonskid shoes/slippers when out of bed     Problem: Adult Inpatient Plan of Care  Goal: Plan of Care Review  Outcome: Ongoing - Unchanged  Goal: Patient-Specific Goal (Individualized)  Outcome: Ongoing - Unchanged  Goal: Absence of Hospital-Acquired Illness or Injury  Outcome: Ongoing - Unchanged  Intervention: Identify and Manage Fall Risk  Recent Flowsheet Documentation  Taken 03/03/2021 0009 by Azzie Almas, RN  Safety Interventions:  ??? bed alarm  ??? commode/urinal/bedpan at bedside  ??? lighting adjusted for tasks/safety  ??? low bed  Taken 03/02/2021 2220 by Azzie Almas, RN  Safety Interventions:  ??? bed alarm  ??? commode/urinal/bedpan at bedside  ??? lighting adjusted for tasks/safety  ??? low bed  Taken 03/02/2021 2030 by Azzie Almas, RN  Safety Interventions:  ??? bed alarm  ??? fall reduction program maintained  ??? lighting adjusted for tasks/safety  ??? low bed  Taken 03/02/2021 1920 by Azzie Almas, RN  Safety Interventions:  ??? bed alarm  ??? commode/urinal/bedpan at bedside  ??? fall reduction program maintained  ??? lighting adjusted for tasks/safety  ??? low bed  ??? nonskid shoes/slippers when out of bed  Intervention: Prevent Skin Injury  Recent Flowsheet Documentation  Taken 03/02/2021 1920 by Azzie Almas, RN  Skin Protection: adhesive use limited  Intervention: Prevent and Manage VTE (Venous Thromboembolism) Risk  Recent Flowsheet Documentation  Taken 03/02/2021 1920 by Azzie Almas, RN  Activity Management:  ??? activity adjusted per tolerance  ??? bedrest  Intervention: Prevent Infection  Recent Flowsheet Documentation  Taken 03/02/2021 1920 by Azzie Almas, RN  Infection Prevention:  ??? cohorting utilized  ??? hand hygiene promoted  ??? rest/sleep promoted  Goal: Optimal Comfort and Wellbeing  Outcome: Ongoing - Unchanged  Goal: Readiness for Transition of Care  Outcome: Ongoing - Unchanged  Goal: Rounds/Family Conference  Outcome: Ongoing - Unchanged     Problem: Self-Care Deficit  Goal: Improved Ability to Complete Activities of Daily Living  Outcome: Ongoing - Unchanged

## 2021-03-03 NOTE — Unmapped (Addendum)
Pt afebrile, A/Ox4 (intermittently forgetful, confused at baseline, with new auditory hallucinations - provider aware, sleep holiday in place to help with delirium.) Pt with low Bps during shift - receiving midodrine per [facility] and also received 1L LR bolus dirng shift. Pt dressings changed and WOCN assessed sacrum and R skin tear; new orders in place. Pt with limited appetite, but encouraged to eat. Pt now L/R turns only - no supine. Q2 turns maintained. Pt resting comforatably and quietly with all safety measures maintained.   Problem: Skin Injury Risk Increased  Goal: Skin Health and Integrity  Outcome: Ongoing - Unchanged  Intervention: Optimize Skin Protection  Recent Flowsheet Documentation  Taken 03/02/2021 0730 by Jimmy Picket, RN  Head of Bed [facility]) Positioning: HOB elevated     Problem: Impaired Wound Healing  Goal: Optimal Wound Healing  Outcome: Ongoing - Unchanged  Intervention: Promote Wound Healing  Recent Flowsheet Documentation  Taken 03/02/2021 0730 by Jimmy Picket, RN  Activity Management: activity adjusted per tolerance     Problem: Fall Injury Risk  Goal: Absence of Fall and Fall-Related Injury  Outcome: Ongoing - Unchanged  Intervention: Promote Injury-Free Environment  Recent Flowsheet Documentation  Taken 03/02/2021 0730 by Jimmy Picket, RN  Safety Interventions:  ??? low bed  ??? lighting adjusted for tasks/safety  ??? fall reduction program maintained  ??? infection management  ??? bed alarm  ??? commode/urinal/bedpan at bedside  ??? nonskid shoes/slippers when out of bed     Problem: Adult Inpatient Plan of Care  Goal: Plan of Care Review  Outcome: Ongoing - Unchanged  Goal: Patient-Specific Goal (Individualized)  Outcome: Ongoing - Unchanged  Goal: Absence of Hospital-Acquired Illness or Injury  Outcome: Ongoing - Unchanged  Intervention: Identify and Manage Fall Risk  Recent Flowsheet Documentation  Taken 03/02/2021 0730 by Jimmy Picket, RN  Safety Interventions:  ??? low bed  ??? lighting adjusted for tasks/safety  ??? fall reduction program maintained  ??? infection management  ??? bed alarm  ??? commode/urinal/bedpan at bedside  ??? nonskid shoes/slippers when out of bed  Intervention: Prevent and Manage VTE (Venous Thromboembolism) Risk  Recent Flowsheet Documentation  Taken 03/02/2021 0730 by Jimmy Picket, RN  Activity Management: activity adjusted per tolerance  Intervention: Prevent Infection  Recent Flowsheet Documentation  Taken 03/02/2021 0730 by Jimmy Picket, RN  Infection Prevention:  ??? cohorting utilized  ??? personal protective equipment utilized  ??? rest/sleep promoted  ??? single patient room provided  Goal: Optimal Comfort and Wellbeing  Outcome: Ongoing - Unchanged  Goal: Readiness for Transition of Care  Outcome: Ongoing - Unchanged  Goal: Rounds/Family Conference  Outcome: Ongoing - Unchanged     Problem: Self-Care Deficit  Goal: Improved Ability to Complete Activities of Daily Living  Outcome: Ongoing - Unchanged

## 2021-03-03 NOTE — Unmapped (Signed)
Orthopaedics Progress Note    Assessment and Plan:  75 y.o. male s/p CMN fixation of right pathologic intertrochanteric femur fracture 02/28/21    ??? Okay from orthopaedic perspective to consider palliative radiation to proximal femur and pelvic metastasis  ??? DVT Prophylaxis: Lovenox 40 mg daily, SCDs, ambulation.  ??? Weightbearing as tolerated.  ??? Multimodal Pain control. Transition off narcotic and IV medications as tolerated.  ??? Antibiotics: surgical prophylaxis indicated for 23 hrs postoperatively.  ??? Dressing changed 4/8; okay to change by RN as needed. Okay for incision to get wet.  ??? Therapy: PT/OT  ??? Disposition: Per primary. Okay to discharge from orthopaedic standpoint. We will arrange follow-up after discharge.     Subjective  No acute events overnight.  Worked with PT yesterday. Says pain is okay. Denied numbness or tingling.    Objective  Vitals: Blood pressure 131/56, pulse 59, temperature 36.5 ??C, temperature source Oral, resp. rate 18, weight 62.7 kg (138 lb 3.7 oz), SpO2 94 %.  General Appearance: Awake and alert   MSK exam:(RLE):    Cardiovascular:   Palpable DP pulse.    Neuro/Compartments:   RLE: Dressing clean, dry, intact, no strikethrough. Dressing changed 4/8: incisions well appearing, no erythema, no palpable fluctuance.  New dressing applied.  Patient fires GS, TA, EHL.  Patient sensation light touch is the same compared to contralateral extremity letter from first webspace, dorsal plantar aspect of foot, medial lateral aspect of foot.  This morning says he has subjective decreased sensation.  Compartment soft compressible.  No pain with passive stretch.  DP pulse 2+.        Contact  Please contact Clement Sayres, APP [phone]) 6am-3pm Tuesday-Friday  On nights (6pm-6am), weekends, and holidays, please page the Consult pager.  On Mondays please contact the person who leaves daily progress notes.

## 2021-03-04 LAB — CBC W/ AUTO DIFF
BASOPHILS ABSOLUTE COUNT: 0 10*9/L (ref 0.0–0.1)
BASOPHILS RELATIVE PERCENT: 0.3 %
EOSINOPHILS ABSOLUTE COUNT: 0 10*9/L (ref 0.0–0.5)
EOSINOPHILS RELATIVE PERCENT: 0.5 %
HEMATOCRIT: 29 % — ABNORMAL LOW (ref 39.0–48.0)
HEMOGLOBIN: 9.8 g/dL — ABNORMAL LOW (ref 12.9–16.5)
LYMPHOCYTES ABSOLUTE COUNT: 0.9 10*9/L — ABNORMAL LOW (ref 1.1–3.6)
LYMPHOCYTES RELATIVE PERCENT: 9.9 %
MEAN CORPUSCULAR HEMOGLOBIN CONC: 33.7 g/dL (ref 32.0–36.0)
MEAN CORPUSCULAR HEMOGLOBIN: 29.9 pg (ref 25.9–32.4)
MEAN CORPUSCULAR VOLUME: 88.9 fL (ref 77.6–95.7)
MEAN PLATELET VOLUME: 7.8 fL (ref 6.8–10.7)
MONOCYTES ABSOLUTE COUNT: 0.5 10*9/L (ref 0.3–0.8)
MONOCYTES RELATIVE PERCENT: 5.2 %
NEUTROPHILS ABSOLUTE COUNT: 7.7 10*9/L (ref 1.8–7.8)
NEUTROPHILS RELATIVE PERCENT: 84.1 %
PLATELET COUNT: 193 10*9/L (ref 150–450)
RED BLOOD CELL COUNT: 3.27 10*12/L — ABNORMAL LOW (ref 4.26–5.60)
RED CELL DISTRIBUTION WIDTH: 17.3 % — ABNORMAL HIGH (ref 12.2–15.2)
WBC ADJUSTED: 9.1 10*9/L (ref 3.6–11.2)

## 2021-03-04 LAB — MAGNESIUM: MAGNESIUM: 1.7 mg/dL (ref 1.6–2.6)

## 2021-03-04 LAB — HEPATIC FUNCTION PANEL
ALBUMIN: 1.9 g/dL — ABNORMAL LOW (ref 3.4–5.0)
ALKALINE PHOSPHATASE: 624 U/L — ABNORMAL HIGH (ref 46–116)
ALT (SGPT): <7 U/L — ABNORMAL LOW (ref 10–49)
AST (SGOT): 16 U/L (ref <=34)
BILIRUBIN DIRECT: 0.3 mg/dL (ref 0.00–0.30)
BILIRUBIN TOTAL: 0.6 mg/dL (ref 0.3–1.2)
PROTEIN TOTAL: 4.4 g/dL — ABNORMAL LOW (ref 5.7–8.2)

## 2021-03-04 LAB — BASIC METABOLIC PANEL
ANION GAP: 7 mmol/L (ref 5–14)
BLOOD UREA NITROGEN: 57 mg/dL — ABNORMAL HIGH (ref 9–23)
BUN / CREAT RATIO: 30
CALCIUM: 8.1 mg/dL — ABNORMAL LOW (ref 8.7–10.4)
CHLORIDE: 107 mmol/L (ref 98–107)
CO2: 24 mmol/L (ref 20.0–31.0)
CREATININE: 1.88 mg/dL — ABNORMAL HIGH
EGFR CKD-EPI AA MALE: 40 mL/min/{1.73_m2} — ABNORMAL LOW (ref >=60)
EGFR CKD-EPI NON-AA MALE: 34 mL/min/{1.73_m2} — ABNORMAL LOW (ref >=60)
GLUCOSE RANDOM: 54 mg/dL — ABNORMAL LOW (ref 70–179)
POTASSIUM: 4 mmol/L (ref 3.4–4.8)
SODIUM: 138 mmol/L (ref 135–145)

## 2021-03-04 LAB — PHOSPHORUS: PHOSPHORUS: 3.9 mg/dL (ref 2.4–5.1)

## 2021-03-04 MED ADMIN — midodrine (PROAMATINE) tablet 5 mg: 5 mg | ORAL | @ 12:00:00

## 2021-03-04 MED ADMIN — melatonin tablet 3 mg: 3 mg | ORAL | @ 02:00:00

## 2021-03-04 MED ADMIN — ascorbic acid (vitamin C) (VITAMIN C) tablet 500 mg: 500 mg | ORAL | @ 12:00:00

## 2021-03-04 MED ADMIN — acetaminophen (TYLENOL) tablet 1,000 mg: 1000 mg | ORAL | @ 18:00:00

## 2021-03-04 MED ADMIN — dronabinoL (MARINOL) capsule 5 mg: 5 mg | ORAL | @ 11:00:00

## 2021-03-04 MED ADMIN — Relugolix (ORGOVYX) tablet 120 mg **PATIENT SUPPLIED**: 120 mg | ORAL | @ 12:00:00

## 2021-03-04 MED ADMIN — acetaminophen (TYLENOL) tablet 1,000 mg: 1000 mg | ORAL | @ 10:00:00

## 2021-03-04 MED ADMIN — pantoprazole (PROTONIX) EC tablet 40 mg: 40 mg | ORAL | @ 02:00:00

## 2021-03-04 MED ADMIN — melatonin tablet 3 mg: 3 mg | ORAL | @ 21:00:00

## 2021-03-04 MED ADMIN — dronabinoL (MARINOL) capsule 5 mg: 5 mg | ORAL | @ 21:00:00

## 2021-03-04 MED ADMIN — citalopram (CeleXA) tablet 20 mg: 20 mg | ORAL | @ 12:00:00

## 2021-03-04 MED ADMIN — midodrine (PROAMATINE) tablet 5 mg: 5 mg | ORAL | @ 18:00:00

## 2021-03-04 MED ADMIN — pantoprazole (PROTONIX) EC tablet 40 mg: 40 mg | ORAL | @ 12:00:00

## 2021-03-04 MED ADMIN — enoxaparin (LOVENOX) syringe 30 mg: 30 mg | SUBCUTANEOUS | @ 12:00:00

## 2021-03-04 MED ADMIN — oxyCODONE (OXYCONTIN) 12 hr crush resistant ER/CR tablet 20 mg: 20 mg | ORAL | @ 02:00:00 | Stop: 2021-03-14

## 2021-03-04 MED ADMIN — senna (SENOKOT) tablet 2 tablet: 2 | ORAL | @ 02:00:00

## 2021-03-04 MED ADMIN — oxyCODONE (OXYCONTIN) 12 hr crush resistant ER/CR tablet 20 mg: 20 mg | ORAL | @ 12:00:00 | Stop: 2021-03-14

## 2021-03-04 MED ADMIN — acetaminophen (TYLENOL) tablet 1,000 mg: 1000 mg | ORAL | @ 02:00:00

## 2021-03-04 MED ADMIN — midodrine (PROAMATINE) tablet 5 mg: 5 mg | ORAL | @ 02:00:00

## 2021-03-04 MED ADMIN — aspirin chewable tablet 81 mg: 81 mg | ORAL | @ 12:00:00

## 2021-03-04 NOTE — Unmapped (Signed)
Problem: Skin Injury Risk Increased  Goal: Skin Health and Integrity  Outcome: Progressing  Intervention: Optimize Skin Protection  Recent Flowsheet Documentation  Taken 03/04/2021 1210 by Leeanne Rio, RN  Pressure Reduction Techniques: frequent weight shift encouraged  Pressure Reduction Devices: pressure-redistributing mattress utilized  Skin Protection: adhesive use limited  Taken 03/04/2021 1030 by Leeanne Rio, RN  Pressure Reduction Techniques: frequent weight shift encouraged  Pressure Reduction Devices: pressure-redistributing mattress utilized  Skin Protection: adhesive use limited  Taken 03/04/2021 0800 by Leeanne Rio, RN  Pressure Reduction Techniques: frequent weight shift encouraged  Pressure Reduction Devices: pressure-redistributing mattress utilized  Skin Protection: adhesive use limited     Problem: Impaired Wound Healing  Goal: Optimal Wound Healing  Outcome: Progressing  Intervention: Promote Wound Healing  Recent Flowsheet Documentation  Taken 03/04/2021 0800 by Leeanne Rio, RN  Activity Management: bedrest     Problem: Adult Inpatient Plan of Care  Goal: Plan of Care Review  Outcome: Progressing  Goal: Patient-Specific Goal (Individualized)  Outcome: Progressing  Goal: Absence of Hospital-Acquired Illness or Injury  Outcome: Progressing  Intervention: Identify and Manage Fall Risk  Recent Flowsheet Documentation  Taken 03/04/2021 1210 by Leeanne Rio, RN  Safety Interventions:   bed alarm   fall reduction program maintained   family at bedside   lighting adjusted for tasks/safety   low bed  Taken 03/04/2021 1030 by Leeanne Rio, RN  Safety Interventions:   bed alarm   lighting adjusted for tasks/safety   low bed   fall reduction program maintained  Taken 03/04/2021 0800 by Leeanne Rio, RN  Safety Interventions:   bed alarm   fall reduction program maintained   lighting adjusted for tasks/safety   low bed  Intervention: Prevent Skin Injury  Recent Flowsheet Documentation  Taken 03/04/2021 1210 by Leeanne Rio, RN  Skin Protection: adhesive use limited  Taken 03/04/2021 1030 by Leeanne Rio, RN  Skin Protection: adhesive use limited  Taken 03/04/2021 0800 by Leeanne Rio, RN  Skin Protection: adhesive use limited  Intervention: Prevent and Manage VTE (Venous Thromboembolism) Risk  Recent Flowsheet Documentation  Taken 03/04/2021 0800 by Leeanne Rio, RN  Activity Management: bedrest  Intervention: Prevent Infection  Recent Flowsheet Documentation  Taken 03/04/2021 1210 by Leeanne Rio, RN  Infection Prevention:   cohorting utilized   hand hygiene promoted   single patient room provided  Taken 03/04/2021 1030 by Leeanne Rio, RN  Infection Prevention:   cohorting utilized   hand hygiene promoted   single patient room provided  Taken 03/04/2021 0800 by Leeanne Rio, RN  Infection Prevention:   cohorting utilized   hand hygiene promoted   single patient room provided  Goal: Optimal Comfort and Wellbeing  Outcome: Progressing  Goal: Readiness for Transition of Care  Outcome: Progressing  Goal: Rounds/Family Conference  Outcome: Progressing     Problem: Self-Care Deficit  Goal: Improved Ability to Complete Activities of Daily Living  Outcome: Progressing

## 2021-03-04 NOTE — Unmapped (Signed)
Oncology (MEDO) Progress Note    Assessment & Plan:   Douglas Gray is a 75 y.o. male with a PMHx of HTN, CAD, grade 2 diastolic dysfunction (echo 2020), s/p CABG & AVR, pAfib, UGIB, PUD, gastric bypass, CKD, hormone-sensitive prostate cancer with diffuse osseous metastases that presented to [facility] with after a fall from his SNF and found to have pathologic acute R displaced rib fractures associated with small pneumothorax and right??pathologic greater trochanteric femur fracture.    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 *    Right??pathologic greater trochanteric femur fracture with possible intertrochanteric extension: History of progressive R hip pain over the last 6-7 months. Found to have a R femur fracture on admission, reportedly new from 3/25 R femur XR completed at [facility]. S/p fixation on 4/6. Pain scores listed as 8's. Patient reports pain as 8 at rest, increased with breathing, due to rib fractures.  -Pain control: home oxycontin 10mg  BID -> 20mg  BID (iso of decreased renal clearance), oxycodone prn, IV dilaudid 1mg  for breakthrough   -Post-op ancef as per Ortho for 24hrs completed  -RadOnc consult for post-op palliative radiation, appreciate assistance: to be arranged outpatient  ??  Pathologic Right Rib Fractures I Small Pneumothorax   Recent admission to [facility] for R 7th and 8th rib fractures with associated pneumothorax (3/16-3/25). Imaging on admission includes additional acute right rib displaced fractures now including at least the 6-10th posterior ribs with small associated pneumothorax and small volume pleural effusion the right hemithorax. No oxygen requirement   -pain control   -continuous pulse oximetry   -supplemental oxygen for SpO2 >90%   -eval for CXR as needed  ??  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 Now on ADT s/p degarelix 11/2020 and will changed to relugolix 01/17/2021 and enzalutamide 02/01/21  (per [facility]ZAMET and [facility]). Enzalutamide on hold as possibly contributing to falls.  -notified Dr. Okey Dupre of admission   -wife brought in relugolix (orgovyx) from home  -home Vit D/Calcium supplement    ??  AKI CKD IIIb: Baseline Cr 1.2-1.4. Cr 1.68 yesterday increased to 2.46 this AM. FeNa 0.3% so pre-renal, likely from decreased PO intake & contrast-induced nephropathy (CT on 4/4).   -Will give gentle & small boluses due to grade 2 diastolic dysfunction and to decrease lines/connections due to ongoing confusion/delirium.   -Repeat afternoon BMP after fluids  -avoid nephrotoxic medications     Delirium  - Delirium precautions  - Sleep holiday  - Decrease # of unnecessary lines  ??  Severe Protein-Calorie Malnutrition in the context of chronic illness (03/01/21 0955)  Energy Intake: < or equal to 75% of estimated energy requirement for > or equal to 1 month  Fat Loss: Severe  Muscle Loss: Severe  Malnutrition Score: 3  -continue home Marinol   -nutrition consulted, appreciate recs  -added super shakes TID    Anxiety:   - Increased home citalopram  20mg  q48h to 20mg  daily for therapeutic dose (latest RX written as 40mg  daily however SNF medication list reports 20mg  q48h)  - Monitor for AE's with above change  - Trazodone PRN        BLE Edema: negative dopplers during admission to [facility] on 3/25. Worse in left ankle, XR negative for fracture.   -hold daily lasix 40mg  daily in setting of hypotension     CAD s/p CABG: aspirin, rosuvastatin   ??  Orthostatic Hypotension: Midodrine sarted on admission to [facility] with improvement in orthostatic symptoms. Long-standing history and being followed by Cardiology & Neurology for pre-syncope without etiology.  - continue midodrine 5mg  TID  - Fluid boluses PRN for orthostatic hypotension   - 03/03/21 TTE with borderline EF, otherwise unremarkable   ??  Hx PUD, UGIB: protonix BID daily   ??    Daily Checklist:  Diet: Regular Diet  DVT PPx: Lovenox 40mg  q24h  Electrolytes: No Repletion Needed  Code Status: Full Code  Dispo: TBD    Team Contact Information:   Primary Team: Oncology (MEDO)  Primary Resident: Napoleon Form, MD  Resident's Pager: [phone] (Oncology Intern - Cliffton Asters)    Interval History:   No acute events overnight.    Reports pain as 8 while resting in bed, mostly over R rib fractures. Reporting frustration at being confused regarding timeline of events, forgetting his wife's phone number.    Objective:   Temp:  [36.4 ??C-36.9 ??C] 36.9 ??C  Heart Rate:  [59-71] 63  Resp:  [18] 18  BP: (103-131)/(55-76) 122/61  SpO2:  [93 %-99 %] 93 %    Gen: Thin elderly gentleman in NAD, answers questions appropriately  Eyes: sclera anicteric, EOMI  HENT: atraumatic, dry mucous membranes  Heart: RRR, S1, S2, no M/R/G, tender to palpation over lower right thoracic cage  Lungs: CTAB, no crackles or wheezes, no use of accessory muscles  Abdomen: Normoactive bowel sounds, soft, NTND, no rebound/guarding  Extremities: no clubbing, cyanosis, or edema in the BLEs; dressing over R hip  Psych: Alert, oriented to self, location, year but not to president (reports Tim Lair), unable to spell WORLD backwards, unable to perform serial 7's, anxious    Labs/Studies: Labs and Studies from the last 24hrs per EMR and Reviewed

## 2021-03-04 NOTE — Unmapped (Signed)
Patient A&Ox1, only oriented to self. Dressing changed on surgical wound. Sleep holiday remains in place 10P-6A. Q2 turns remain in place. BP's soft, a few rechecks. Remained afebrile and free of falls. WCTM.

## 2021-03-04 NOTE — Unmapped (Signed)
Oncology (MEDO) Progress Note    Assessment & Plan:   Douglas Gray is a 75 y.o. 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 *    Right??pathologic greater trochanteric femur fracture with possible intertrochanteric extension: History of progressive R hip pain over the last 6-7 months. Found to have a R femur fracture on admission, reportedly new from 3/25 R femur XR completed at [facility]. S/p fixation on 4/6. Pain scores listed as 8's. Patient reports pain as 8 at rest, increased with breathing, due to rib fractures.  -Pain control: home oxycontin 10mg  BID -> 20mg  BID (iso of decreased renal clearance), oxycodone prn, IV dilaudid 1mg  for breakthrough   -Post-op ancef as per Ortho for 24hrs completed  -RadOnc consult for post-op palliative radiation, appreciate assistance: to be arranged outpatient  ??  Pathologic Right Rib Fractures I Small Pneumothorax   Recent admission to [facility] for R 7th and 8th rib fractures with associated pneumothorax (3/16-3/25). Imaging on admission includes additional acute right rib displaced fractures now including at least the 6-10th posterior ribs with small associated pneumothorax and small volume pleural effusion the right hemithorax. No oxygen requirement   -pain control   -continuous pulse oximetry   -supplemental oxygen for SpO2 >90%   -eval for CXR as needed  ??  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 Now on ADT s/p degarelix 11/2020 and will changed to relugolix 01/17/2021 and enzalutamide 02/01/21  (per ENZAMET and ARCHES). Enzalutamide on hold as possibly contributing to falls.  -notified Dr. Okey Dupre of admission   -wife brought in relugolix (orgovyx) from home  -home Vit D/Calcium supplement    ??  AKI CKD IIIb: Baseline Cr 1.2-1.4. Creatinine increased and peaked at 2.46, currently downtrending. FeNa 0.3% so pre-renal, likely from decreased PO intake & contrast-induced nephropathy (CT on 4/4). Continues to improve.  -Promote PO intake  -avoid nephrotoxic medications     Delirium  Has been having visual and auditory hallucinations.  - Delirium precautions  - Sleep holiday  - Decrease # of unnecessary lines  ??  Severe Protein-Calorie Malnutrition in the context of chronic illness (03/01/21 0955)  Energy Intake: < or equal to 75% of estimated energy requirement for > or equal to 1 month  Fat Loss: Severe  Muscle Loss: Severe  Malnutrition Score: 3  -continue home Marinol   -nutrition consulted, appreciate recs  -added super shakes TID    Anxiety:   - Increased home citalopram 20mg  q48h to 20mg  daily for therapeutic dose (latest RX written as 40mg  daily  however SNF medication list reports 20mg  q48h)  - Monitor for AE's with above change  - Trazodone PRN        BLE Edema: negative dopplers during admission to [facility] on 3/25. Worse in left ankle, XR negative for fracture.   -hold daily lasix 40mg  daily in setting of hypotension     CAD s/p CABG: aspirin, rosuvastatin   ??  Orthostatic Hypotension: Midodrine sarted on admission to [facility] with improvement in orthostatic symptoms. Long-standing history and being followed by Cardiology & Neurology for pre-syncope without etiology.  - continue midodrine 5mg  TID  - Fluid boluses PRN for orthostatic hypotension   - 03/03/21 TTE with borderline EF, otherwise unremarkable   - Unable to obtain orthostatic vitals as unable to stand due to pain  ??  Hx PUD, UGIB: protonix BID daily   ??    Daily Checklist:  Diet: Regular Diet  DVT PPx: Lovenox 40mg  q24h  Electrolytes: No Repletion Needed  Code Status: Full Code  Dispo: SNF    Team Contact Information:   Primary Team: Oncology (MEDO)  Primary Resident: Pearline Cables, MD  Resident's Pager: [phone] (Oncology Intern - Cliffton Asters)    Interval History:   No acute events overnight.    Has not required any PRN pain medications in the past 24hrs. Denies any pain at the moment. As per RN report and witnessed during rounds, patient having visual hallucinations: seeing two men in the room fighting each other. Son at bedside.    Objective:   Temp:  [36.5 ??C-37.1 ??C] 36.6 ??C  Heart Rate:  [63-72] 64  Resp:  [16-18] 16  BP: (90-122)/(47-70) 108/67  SpO2:  [93 %-98 %] 98 %    Gen: Thin elderly gentleman in NAD, answers questions appropriately  Eyes: sclera anicteric, EOMI  HENT: atraumatic, dry mucous membranes  Heart: RRR, S1, S2, no M/R/G, tender to palpation over lower right thoracic cage  Lungs: CTAB, no crackles or wheezes, no use of accessory muscles  Abdomen: Normoactive bowel sounds, soft, NTND, no rebound/guarding  Extremities: no clubbing, cyanosis, or edema in the BLEs; dressing over R hip  Psych: Alert, oriented to self only, anxious    Labs/Studies: Labs and Studies from the last 24hrs per EMR and Reviewed

## 2021-03-05 LAB — PHOSPHORUS: PHOSPHORUS: 3.7 mg/dL (ref 2.4–5.1)

## 2021-03-05 LAB — CBC W/ AUTO DIFF
BASOPHILS ABSOLUTE COUNT: 0.1 10*9/L (ref 0.0–0.1)
BASOPHILS RELATIVE PERCENT: 0.4 %
EOSINOPHILS ABSOLUTE COUNT: 0.1 10*9/L (ref 0.0–0.5)
EOSINOPHILS RELATIVE PERCENT: 1.3 %
HEMATOCRIT: 32.4 % — ABNORMAL LOW (ref 39.0–48.0)
HEMOGLOBIN: 10.7 g/dL — ABNORMAL LOW (ref 12.9–16.5)
LYMPHOCYTES ABSOLUTE COUNT: 1.1 10*9/L (ref 1.1–3.6)
LYMPHOCYTES RELATIVE PERCENT: 9.3 %
MEAN CORPUSCULAR HEMOGLOBIN CONC: 33 g/dL (ref 32.0–36.0)
MEAN CORPUSCULAR HEMOGLOBIN: 29.5 pg (ref 25.9–32.4)
MEAN CORPUSCULAR VOLUME: 89.4 fL (ref 77.6–95.7)
MEAN PLATELET VOLUME: 8.2 fL (ref 6.8–10.7)
MONOCYTES ABSOLUTE COUNT: 0.6 10*9/L (ref 0.3–0.8)
MONOCYTES RELATIVE PERCENT: 5.1 %
NEUTROPHILS ABSOLUTE COUNT: 9.8 10*9/L — ABNORMAL HIGH (ref 1.8–7.8)
NEUTROPHILS RELATIVE PERCENT: 83.9 %
PLATELET COUNT: 187 10*9/L (ref 150–450)
RED BLOOD CELL COUNT: 3.62 10*12/L — ABNORMAL LOW (ref 4.26–5.60)
RED CELL DISTRIBUTION WIDTH: 17.2 % — ABNORMAL HIGH (ref 12.2–15.2)
WBC ADJUSTED: 11.7 10*9/L — ABNORMAL HIGH (ref 3.6–11.2)

## 2021-03-05 LAB — BASIC METABOLIC PANEL
ANION GAP: 14 mmol/L (ref 5–14)
BLOOD UREA NITROGEN: 53 mg/dL — ABNORMAL HIGH (ref 9–23)
BUN / CREAT RATIO: 33
CALCIUM: 8.5 mg/dL — ABNORMAL LOW (ref 8.7–10.4)
CHLORIDE: 110 mmol/L — ABNORMAL HIGH (ref 98–107)
CO2: 16 mmol/L — ABNORMAL LOW (ref 20.0–31.0)
CREATININE: 1.62 mg/dL — ABNORMAL HIGH
EGFR CKD-EPI AA MALE: 47 mL/min/{1.73_m2} — ABNORMAL LOW (ref >=60)
EGFR CKD-EPI NON-AA MALE: 41 mL/min/{1.73_m2} — ABNORMAL LOW (ref >=60)
GLUCOSE RANDOM: 49 mg/dL — ABNORMAL LOW (ref 70–179)
POTASSIUM: 4.2 mmol/L (ref 3.4–4.8)
SODIUM: 140 mmol/L (ref 135–145)

## 2021-03-05 LAB — HEPATIC FUNCTION PANEL
ALBUMIN: 2.1 g/dL — ABNORMAL LOW (ref 3.4–5.0)
ALKALINE PHOSPHATASE: 646 U/L — ABNORMAL HIGH (ref 46–116)
ALT (SGPT): <7 U/L — ABNORMAL LOW (ref 10–49)
AST (SGOT): 21 U/L (ref <=34)
BILIRUBIN DIRECT: 0.5 mg/dL — ABNORMAL HIGH (ref 0.00–0.30)
BILIRUBIN TOTAL: 0.8 mg/dL (ref 0.3–1.2)
PROTEIN TOTAL: 5 g/dL — ABNORMAL LOW (ref 5.7–8.2)

## 2021-03-05 LAB — MAGNESIUM: MAGNESIUM: 1.8 mg/dL (ref 1.6–2.6)

## 2021-03-05 MED ORDER — HALOPERIDOL LACTATE 2 MG/ML ORAL CONCENTRATE
Freq: Four times a day (QID) | ORAL | 0 days | PRN
Start: 2021-03-05 — End: ?

## 2021-03-05 MED ADMIN — senna (SENOKOT) tablet 2 tablet: 2 | ORAL | @ 01:00:00

## 2021-03-05 MED ADMIN — pantoprazole (PROTONIX) EC tablet 40 mg: 40 mg | ORAL | @ 01:00:00

## 2021-03-05 MED ADMIN — midodrine (PROAMATINE) tablet 5 mg: 5 mg | ORAL | @ 01:00:00

## 2021-03-05 MED ADMIN — oxyCODONE (OXYCONTIN) 12 hr crush resistant ER/CR tablet 20 mg: 20 mg | ORAL | @ 13:00:00 | Stop: 2021-03-14

## 2021-03-05 MED ADMIN — citalopram (CeleXA) tablet 20 mg: 20 mg | ORAL | @ 13:00:00

## 2021-03-05 MED ADMIN — aspirin chewable tablet 81 mg: 81 mg | ORAL | @ 13:00:00

## 2021-03-05 MED ADMIN — Relugolix (ORGOVYX) tablet 120 mg **PATIENT SUPPLIED**: 120 mg | ORAL | @ 13:00:00

## 2021-03-05 MED ADMIN — acetaminophen (TYLENOL) tablet 1,000 mg: 1000 mg | ORAL | @ 01:00:00

## 2021-03-05 MED ADMIN — melatonin tablet 3 mg: 3 mg | ORAL | @ 22:00:00

## 2021-03-05 MED ADMIN — dronabinoL (MARINOL) capsule 5 mg: 5 mg | ORAL | @ 11:00:00

## 2021-03-05 MED ADMIN — midodrine (PROAMATINE) tablet 5 mg: 5 mg | ORAL | @ 13:00:00

## 2021-03-05 MED ADMIN — oxyCODONE (OXYCONTIN) 12 hr crush resistant ER/CR tablet 20 mg: 20 mg | ORAL | @ 01:00:00 | Stop: 2021-03-14

## 2021-03-05 MED ADMIN — acetaminophen (TYLENOL) tablet 1,000 mg: 1000 mg | ORAL | @ 17:00:00

## 2021-03-05 MED ADMIN — polyethylene glycol (MIRALAX) packet 17 g: 17 g | ORAL | @ 01:00:00

## 2021-03-05 MED ADMIN — enoxaparin (LOVENOX) syringe 30 mg: 30 mg | SUBCUTANEOUS | @ 13:00:00

## 2021-03-05 MED ADMIN — ascorbic acid (vitamin C) (VITAMIN C) tablet 500 mg: 500 mg | ORAL | @ 13:00:00

## 2021-03-05 MED ADMIN — acetaminophen (TYLENOL) tablet 1,000 mg: 1000 mg | ORAL | @ 11:00:00

## 2021-03-05 MED ADMIN — midodrine (PROAMATINE) tablet 5 mg: 5 mg | ORAL | @ 17:00:00

## 2021-03-05 MED ADMIN — pantoprazole (PROTONIX) EC tablet 40 mg: 40 mg | ORAL | @ 13:00:00

## 2021-03-05 MED ADMIN — dronabinoL (MARINOL) capsule 5 mg: 5 mg | ORAL | @ 22:00:00

## 2021-03-05 NOTE — Unmapped (Signed)
Problem: Skin Injury Risk Increased  Goal: Skin Health and Integrity  Outcome: Progressing  Intervention: Optimize Skin Protection  Recent Flowsheet Documentation  Taken 03/05/2021 0800 by Leeanne Rio, RN  Pressure Reduction Techniques: frequent weight shift encouraged  Pressure Reduction Devices: pressure-redistributing mattress utilized  Skin Protection: adhesive use limited     Problem: Impaired Wound Healing  Goal: Optimal Wound Healing  Outcome: Progressing  Intervention: Promote Wound Healing  Recent Flowsheet Documentation  Taken 03/05/2021 0800 by Leeanne Rio, RN  Activity Management: bedrest     Problem: Fall Injury Risk  Goal: Absence of Fall and Fall-Related Injury  Outcome: Progressing  Intervention: Promote Scientist, clinical (histocompatibility and immunogenetics) Documentation  Taken 03/05/2021 0800 by Leeanne Rio, RN  Safety Interventions:   bed alarm   lighting adjusted for tasks/safety   low bed   nonskid shoes/slippers when out of bed   fall reduction program maintained     Problem: Adult Inpatient Plan of Care  Goal: Plan of Care Review  Outcome: Progressing  Goal: Patient-Specific Goal (Individualized)  Outcome: Progressing  Goal: Absence of Hospital-Acquired Illness or Injury  Outcome: Progressing  Intervention: Identify and Manage Fall Risk  Recent Flowsheet Documentation  Taken 03/05/2021 0800 by Leeanne Rio, RN  Safety Interventions:   bed alarm   lighting adjusted for tasks/safety   low bed   nonskid shoes/slippers when out of bed   fall reduction program maintained  Intervention: Prevent Skin Injury  Recent Flowsheet Documentation  Taken 03/05/2021 0800 by Leeanne Rio, RN  Skin Protection: adhesive use limited  Intervention: Prevent and Manage VTE (Venous Thromboembolism) Risk  Recent Flowsheet Documentation  Taken 03/05/2021 0800 by Leeanne Rio, RN  Activity Management: bedrest  Intervention: Prevent Infection  Recent Flowsheet Documentation  Taken 03/05/2021 0800 by Leeanne Rio, RN  Infection Prevention:   cohorting utilized   hand hygiene promoted   single patient room provided  Goal: Optimal Comfort and Wellbeing  Outcome: Progressing  Goal: Readiness for Transition of Care  Outcome: Progressing  Goal: Rounds/Family Conference  Outcome: Progressing     Problem: Self-Care Deficit  Goal: Improved Ability to Complete Activities of Daily Living  Outcome: Progressing

## 2021-03-05 NOTE — Unmapped (Signed)
Patient A&Ox1, only oriented to self. Sleep holiday remains in place 10P-6A. Patient is a left and right turn only, no supine. Visual hallucinations still happening, a few times during shift. Remained afebrile and free of falls. WCTM.

## 2021-03-05 NOTE — Unmapped (Signed)
Oncology (MEDO) Progress Note    Assessment & Plan:   Douglas Gray is a 75 y.o. 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 *    Right??pathologic greater trochanteric femur fracture with possible intertrochanteric extension: History of progressive R hip pain over the last 6-7 months. Found to have a R femur fracture on admission, reportedly new from 3/25 R femur XR completed at [facility]. S/p fixation on 4/6. Pain scores listed as 8's. Patient reports pain as 8 at rest, increased with breathing, due to rib fractures.  -Pain control: home oxycontin 10mg  BID -> 20mg  BID (iso of decreased renal clearance), oxycodone prn, IV dilaudid 1mg  for breakthrough   -Post-op ancef as per Ortho for 24hrs completed  -RadOnc consult for post-op palliative radiation, appreciate assistance: to be arranged outpatient  ??  Pathologic Right Rib Fractures I Small Pneumothorax   Recent admission to [facility] for R 7th and 8th rib fractures with associated pneumothorax (3/16-3/25). Imaging on admission includes additional acute right rib displaced fractures now including at least the 6-10th posterior ribs with small associated pneumothorax and small volume pleural effusion the right hemithorax. No oxygen requirement   -pain control   -continuous pulse oximetry   -supplemental oxygen for SpO2 >90%   -eval for CXR as needed  ??  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 Now on ADT s/p degarelix 11/2020 and will changed to relugolix 01/17/2021 and enzalutamide 02/01/21  (per ENZAMET and ARCHES). Enzalutamide on hold as possibly contributing to falls.  -notified Dr. Okey Dupre of admission   -wife brought in relugolix (orgovyx) from home  -home Vit D/Calcium supplement    ??  AKI CKD IIIb: Baseline Cr 1.2-1.4. Creatinine increased and peaked at 2.46, currently downtrending. FeNa 0.3% so pre-renal, likely from decreased PO intake & contrast-induced nephropathy (CT on 4/4). Continues to improve.  -Promote PO intake  -avoid nephrotoxic medications     Delirium  Has been having visual and auditory hallucinations.  - Delirium precautions  - Sleep holiday  - Decrease # of unnecessary lines  ??  Severe Protein-Calorie Malnutrition in the context of chronic illness (03/01/21 0955)  Energy Intake: < or equal to 75% of estimated energy requirement for > or equal to 1 month  Fat Loss: Severe  Muscle Loss: Severe  Malnutrition Score: 3  -continue home Marinol   -nutrition consulted, appreciate recs  -added super shakes TID  -Calorie count    Anxiety:   - Increased home citalopram 20mg  q48h to 20mg  daily for therapeutic dose (latest RX written  as 40mg  daily however SNF medication list reports 20mg  q48h)  - Monitor for AE's with above change  - Trazodone PRN        BLE Edema: negative dopplers during admission to [facility] on 3/25. Worse in left ankle, XR negative for fracture.   -hold daily lasix 40mg  daily in setting of hypotension     CAD s/p CABG: aspirin, rosuvastatin   ??  Orthostatic Hypotension: Midodrine sarted on admission to [facility] with improvement in orthostatic symptoms. Long-standing history and being followed by Cardiology & Neurology for pre-syncope without etiology.  - continue midodrine 5mg  TID  - Fluid boluses PRN for orthostatic hypotension   - 03/03/21 TTE with borderline EF, otherwise unremarkable   - Unable to obtain orthostatic vitals as unable to stand due to pain  ??  Hx PUD, UGIB: protonix BID daily   ??    Daily Checklist:  Diet: Regular Diet  DVT PPx: Lovenox 40mg  q24h  Electrolytes: No Repletion Needed  Code Status: Full Code  Dispo: SNF    Team Contact Information:   Primary Team: Oncology (MEDO)  Primary Resident: Pearline Cables, MD  Resident's Pager: [phone] (Oncology Intern - Cliffton Asters)    Interval History:   No acute events overnight.    Has not required any PRN pain medications in the past 24hrs. Denies any pain at the moment. Denies any auditory or visual hallucinations since yesterday afternoon. Appears to be in better spirits this morning and smiling.    Currently pending dispo, placement to SNF.    Objective:   Temp:  [36.4 ??C-36.7 ??C] 36.7 ??C  Heart Rate:  [64-79] 79  Resp:  [16-20] 18  BP: (106-122)/(56-74) 113/74  SpO2:  [92 %-98 %] 97 %    Gen: Thin elderly gentleman in NAD, answers questions appropriately  Eyes: sclera anicteric, EOMI  HENT: atraumatic, dry mucous membranes  Heart: RRR, S1, S2, no M/R/G, tender to palpation over lower right thoracic cage  Lungs: CTAB, no crackles or wheezes, no use of accessory muscles  Abdomen: Normoactive bowel sounds, soft, NTND, no rebound/guarding  Extremities: no clubbing, cyanosis, or edema in the BLEs; dressing over R hip  Psych: Alert, oriented to self, year & date, location being hospital but believes he is in [place], less anxious than yesterday, smiling and pleasant    Labs/Studies: Labs and Studies from the last 24hrs per EMR and Reviewed

## 2021-03-06 DIAGNOSIS — C7951 Secondary malignant neoplasm of bone: Principal | ICD-10-CM

## 2021-03-06 LAB — CBC W/ AUTO DIFF
BASOPHILS ABSOLUTE COUNT: 0 10*9/L (ref 0.0–0.1)
BASOPHILS RELATIVE PERCENT: 0.5 %
EOSINOPHILS ABSOLUTE COUNT: 0.1 10*9/L (ref 0.0–0.5)
EOSINOPHILS RELATIVE PERCENT: 1 %
HEMATOCRIT: 30.4 % — ABNORMAL LOW (ref 39.0–48.0)
HEMOGLOBIN: 10.1 g/dL — ABNORMAL LOW (ref 12.9–16.5)
LYMPHOCYTES ABSOLUTE COUNT: 0.8 10*9/L — ABNORMAL LOW (ref 1.1–3.6)
LYMPHOCYTES RELATIVE PERCENT: 8.6 %
MEAN CORPUSCULAR HEMOGLOBIN CONC: 33.3 g/dL (ref 32.0–36.0)
MEAN CORPUSCULAR HEMOGLOBIN: 29.9 pg (ref 25.9–32.4)
MEAN CORPUSCULAR VOLUME: 89.7 fL (ref 77.6–95.7)
MEAN PLATELET VOLUME: 7.2 fL (ref 6.8–10.7)
MONOCYTES ABSOLUTE COUNT: 0.5 10*9/L (ref 0.3–0.8)
MONOCYTES RELATIVE PERCENT: 5.3 %
NEUTROPHILS ABSOLUTE COUNT: 8.1 10*9/L — ABNORMAL HIGH (ref 1.8–7.8)
NEUTROPHILS RELATIVE PERCENT: 84.6 %
PLATELET COUNT: 211 10*9/L (ref 150–450)
RED BLOOD CELL COUNT: 3.39 10*12/L — ABNORMAL LOW (ref 4.26–5.60)
RED CELL DISTRIBUTION WIDTH: 17.2 % — ABNORMAL HIGH (ref 12.2–15.2)
WBC ADJUSTED: 9.6 10*9/L (ref 3.6–11.2)

## 2021-03-06 LAB — HEPATIC FUNCTION PANEL
ALBUMIN: 1.9 g/dL — ABNORMAL LOW (ref 3.4–5.0)
ALKALINE PHOSPHATASE: 591 U/L — ABNORMAL HIGH (ref 46–116)
ALT (SGPT): <7 U/L — ABNORMAL LOW (ref 10–49)
AST (SGOT): 21 U/L (ref <=34)
BILIRUBIN DIRECT: 0.8 mg/dL — ABNORMAL HIGH (ref 0.00–0.30)
BILIRUBIN TOTAL: 1.3 mg/dL — ABNORMAL HIGH (ref 0.3–1.2)
PROTEIN TOTAL: 4.4 g/dL — ABNORMAL LOW (ref 5.7–8.2)

## 2021-03-06 LAB — SLIDE REVIEW

## 2021-03-06 LAB — BASIC METABOLIC PANEL
ANION GAP: 8 mmol/L (ref 5–14)
BLOOD UREA NITROGEN: 58 mg/dL — ABNORMAL HIGH (ref 9–23)
BUN / CREAT RATIO: 40
CALCIUM: 8.3 mg/dL — ABNORMAL LOW (ref 8.7–10.4)
CHLORIDE: 111 mmol/L — ABNORMAL HIGH (ref 98–107)
CO2: 22 mmol/L (ref 20.0–31.0)
CREATININE: 1.44 mg/dL — ABNORMAL HIGH
EGFR CKD-EPI AA MALE: 55 mL/min/{1.73_m2} — ABNORMAL LOW (ref >=60)
EGFR CKD-EPI NON-AA MALE: 47 mL/min/{1.73_m2} — ABNORMAL LOW (ref >=60)
GLUCOSE RANDOM: 57 mg/dL — ABNORMAL LOW (ref 70–179)
POTASSIUM: 4.2 mmol/L (ref 3.5–5.1)
SODIUM: 141 mmol/L (ref 135–145)

## 2021-03-06 LAB — INSULIN, TOTAL: INSULIN LEVEL: 1.3 mU/L — ABNORMAL LOW (ref 2.6–37.6)

## 2021-03-06 LAB — C-PEPTIDE: C-PEPTIDE: 0.49 ng/mL (ref 0.48–5.05)

## 2021-03-06 LAB — PHOSPHORUS: PHOSPHORUS: 3.1 mg/dL (ref 2.4–5.1)

## 2021-03-06 LAB — MAGNESIUM: MAGNESIUM: 1.7 mg/dL (ref 1.6–2.6)

## 2021-03-06 MED ADMIN — thiamine mononitrate (vit B1) tablet 200 mg: 200 mg | ORAL | @ 22:00:00 | Stop: 2021-03-11

## 2021-03-06 MED ADMIN — oxyCODONE (OXYCONTIN) 12 hr crush resistant ER/CR tablet 20 mg: 20 mg | ORAL | @ 01:00:00 | Stop: 2021-03-14

## 2021-03-06 MED ADMIN — acetaminophen (TYLENOL) tablet 1,000 mg: 1000 mg | ORAL | @ 18:00:00

## 2021-03-06 MED ADMIN — Relugolix (ORGOVYX) tablet 120 mg **PATIENT SUPPLIED**: 120 mg | ORAL | @ 12:00:00

## 2021-03-06 MED ADMIN — magnesium oxide (MAG-OX) tablet 400 mg: 400 mg | ORAL | @ 14:00:00

## 2021-03-06 MED ADMIN — acetaminophen (TYLENOL) tablet 1,000 mg: 1000 mg | ORAL | @ 10:00:00

## 2021-03-06 MED ADMIN — senna (SENOKOT) tablet 2 tablet: 2 | ORAL | @ 01:00:00

## 2021-03-06 MED ADMIN — ascorbic acid (vitamin C) (VITAMIN C) tablet 500 mg: 500 mg | ORAL | @ 12:00:00

## 2021-03-06 MED ADMIN — midodrine (PROAMATINE) tablet 5 mg: 5 mg | ORAL | @ 12:00:00

## 2021-03-06 MED ADMIN — melatonin tablet 3 mg: 3 mg | ORAL | @ 22:00:00

## 2021-03-06 MED ADMIN — enoxaparin (LOVENOX) syringe 30 mg: 30 mg | SUBCUTANEOUS | @ 12:00:00

## 2021-03-06 MED ADMIN — midodrine (PROAMATINE) tablet 5 mg: 5 mg | ORAL | @ 01:00:00

## 2021-03-06 MED ADMIN — dextrose 5 % in lactated ringers infusion: 100 mL/h | INTRAVENOUS | @ 14:00:00 | Stop: 2021-03-06

## 2021-03-06 MED ADMIN — acetaminophen (TYLENOL) tablet 1,000 mg: 1000 mg | ORAL | @ 01:00:00

## 2021-03-06 MED ADMIN — oxyCODONE (OXYCONTIN) 12 hr crush resistant ER/CR tablet 20 mg: 20 mg | ORAL | @ 12:00:00 | Stop: 2021-03-06

## 2021-03-06 MED ADMIN — citalopram (CeleXA) tablet 20 mg: 20 mg | ORAL | @ 12:00:00

## 2021-03-06 MED ADMIN — midodrine (PROAMATINE) tablet 5 mg: 5 mg | ORAL | @ 18:00:00

## 2021-03-06 MED ADMIN — pantoprazole (PROTONIX) EC tablet 40 mg: 40 mg | ORAL | @ 01:00:00

## 2021-03-06 MED ADMIN — aspirin chewable tablet 81 mg: 81 mg | ORAL | @ 12:00:00

## 2021-03-06 MED ADMIN — pantoprazole (PROTONIX) EC tablet 40 mg: 40 mg | ORAL | @ 12:00:00

## 2021-03-06 NOTE — Unmapped (Signed)
Problem: Skin Injury Risk Increased  Goal: Skin Health and Integrity  Outcome: Progressing  Intervention: Optimize Skin Protection  Recent Flowsheet Documentation  Taken 03/06/2021 1000 by Leeanne Rio, RN  Pressure Reduction Techniques: frequent weight shift encouraged  Pressure Reduction Devices: pressure-redistributing mattress utilized  Skin Protection: adhesive use limited  Taken 03/06/2021 0719 by Leeanne Rio, RN  Pressure Reduction Techniques: frequent weight shift encouraged  Pressure Reduction Devices: pressure-redistributing mattress utilized  Skin Protection: adhesive use limited     Problem: Impaired Wound Healing  Goal: Optimal Wound Healing  Outcome: Progressing  Intervention: Promote Wound Healing  Recent Flowsheet Documentation  Taken 03/06/2021 0719 by Leeanne Rio, RN  Activity Management: bedrest     Problem: Fall Injury Risk  Goal: Absence of Fall and Fall-Related Injury  Outcome: Progressing  Intervention: Promote Scientist, clinical (histocompatibility and immunogenetics) Documentation  Taken 03/06/2021 1000 by Leeanne Rio, RN  Safety Interventions:   bed alarm   lighting adjusted for tasks/safety   low bed   fall reduction program maintained  Taken 03/06/2021 0719 by Leeanne Rio, RN  Safety Interventions:   bed alarm   lighting adjusted for tasks/safety   low bed   fall reduction program maintained     Problem: Adult Inpatient Plan of Care  Goal: Plan of Care Review  Outcome: Progressing  Goal: Patient-Specific Goal (Individualized)  Outcome: Progressing  Goal: Absence of Hospital-Acquired Illness or Injury  Outcome: Progressing  Intervention: Identify and Manage Fall Risk  Recent Flowsheet Documentation  Taken 03/06/2021 1000 by Leeanne Rio, RN  Safety Interventions:   bed alarm   lighting adjusted for tasks/safety   low bed   fall reduction program maintained  Taken 03/06/2021 0719 by Leeanne Rio, RN  Safety Interventions:   bed alarm   lighting adjusted for tasks/safety   low bed   fall reduction program maintained  Intervention: Prevent Skin Injury  Recent Flowsheet Documentation  Taken 03/06/2021 1000 by Leeanne Rio, RN  Skin Protection: adhesive use limited  Taken 03/06/2021 0719 by Leeanne Rio, RN  Skin Protection: adhesive use limited  Intervention: Prevent and Manage VTE (Venous Thromboembolism) Risk  Recent Flowsheet Documentation  Taken 03/06/2021 0719 by Leeanne Rio, RN  Activity Management: bedrest  Intervention: Prevent Infection  Recent Flowsheet Documentation  Taken 03/06/2021 1000 by Leeanne Rio, RN  Infection Prevention:   cohorting utilized   hand hygiene promoted   single patient room provided  Taken 03/06/2021 0719 by Leeanne Rio, RN  Infection Prevention:   cohorting utilized   hand hygiene promoted   single patient room provided  Goal: Optimal Comfort and Wellbeing  Outcome: Progressing  Goal: Readiness for Transition of Care  Outcome: Progressing  Goal: Rounds/Family Conference  Outcome: Progressing     Problem: Self-Care Deficit  Goal: Improved Ability to Complete Activities of Daily Living  Outcome: Progressing

## 2021-03-06 NOTE — Unmapped (Signed)
Patient alert oriented to name only, confused and having visual hallucinations, free from all, bed alarm, needs attended, due medications given, with apple sauce, bed alarm on. Will continue to monitor.     Problem: Adult Inpatient Plan of Care  Goal: Plan of Care Review  Outcome: Progressing  Goal: Patient-Specific Goal (Individualized)  Outcome: Progressing  Goal: Absence of Hospital-Acquired Illness or Injury  Outcome: Progressing  Intervention: Identify and Manage Fall Risk  Taken 03/05/2021 2058 by Otho Perl, RN  Safety Interventions:   bed alarm   fall reduction program maintained   lighting adjusted for tasks/safety   low bed  Intervention: Prevent and Manage VTE (Venous Thromboembolism) Risk  Recent Flowsheet Documentation  Taken 03/05/2021 2058 by Otho Perl, RN  Activity Management: bedrest  Intervention: Prevent Infection  Recent Flowsheet Documentation  Taken 03/05/2021 2058 by Otho Perl, RN  Infection Prevention:   hand hygiene promoted   rest/sleep promoted  Goal: Optimal Comfort and Wellbeing  Outcome: Progressing  Problem: Fall Injury Risk  Goal: Absence of Fall and Fall-Related Injury  Outcome: Progressing  Intervention: Promote Injury-Free Environment  Taken 03/05/2021 2058 by Otho Perl, RN  Safety Interventions:   bed alarm   fall reduction program maintained   lighting adjusted for tasks/safety   low bed     Problem: Self-Care Deficit  Goal: Improved Ability to Complete Activities of Daily Living  Outcome: Progressing

## 2021-03-06 NOTE — Unmapped (Signed)
[facility] Consult Services  ADVANCED FOOT AND NAIL CARE CONSULT     Reason For Consult:  - Initial  - Nail Care    Assessment:  Received order for [facility] to complete Advanced Foot and Nail Care Service.  Patient reports that he has not been able to independently cut toenails in awhile.    Unknown if the patient has been to a Podiatrist.  The Patient is agreeable to [facility] completing nail care.    Nail Assessment:  -  Toe Nail:  slightly long  -  No Ulcerations, foot deformities, or other nail concerns noted.      Procedure:  -  Patient in need of toenail cleaning and cutting only.  -  All debris removed from below nail surface with cuticle stick, nails cut with sterile clippers, then nails filed with nail file.  -  Patient tolerated the procedure well    Instruments Used:  -  Sterile nail clippers  -  100/180 grit nail file    Plan:  -  Instructed the Patient the risk of ulceration, infection, and trauma with overgrown nails. Reviewed the importance of following up with [facility], Podiatrist or PCP to continue to maintain nail care/length.    Workup Time:  30 minutes     Edythe Clarity, RN, BSN, [place], [place]  Pager [phone]  Office [phone]

## 2021-03-06 NOTE — Unmapped (Signed)
Called and spoke with pts wife to get him scheduled for his CT sim in [place]. Wife [name] chose 4/26 at 2pm and verbally confirmed appt

## 2021-03-06 NOTE — Unmapped (Signed)
Oncology (MEDO) Progress Note    Assessment & Plan:   Douglas Gray is a 75 y.o. 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 *    Right??pathologic greater trochanteric femur fracture with possible intertrochanteric extension: History of progressive R hip pain over the last 6-7 months. Found to have a R femur fracture on admission, reportedly new from 3/25 R femur XR completed at [facility]. S/p fixation on 4/6. Pain improving as per patient  -Pain control: decreasing oxycontin 20mg  BID to home regimen of 10mg  BID due to myoclonic jerks and cognitive clouding, oxycodone prn, IV dilaudid 1mg  for breakthrough   -Post-op ancef as per Ortho for 24hrs completed  -RadOnc consult for post-op palliative radiation, appreciate assistance: to be arranged outpatient  ??  Pathologic Right Rib Fractures I Small Pneumothorax   Recent admission to [facility] for R 7th and 8th rib fractures with associated pneumothorax (3/16-3/25). Imaging on admission includes additional acute right rib displaced fractures now including at least the 6-10th posterior ribs with small associated pneumothorax and small volume pleural effusion the right hemithorax. No oxygen requirement   -pain control   -continuous pulse oximetry   -supplemental oxygen for SpO2 >90%   -eval for CXR as needed    Orthostatic Hypotension: Patient reports headache in between his eyes, seeing pink spots, +photophobia and phonophobia when he gets up from sitting to standing. Denied any room spinning or feeling off-balance, however wife reports he has previously endorsed the room spinning. Had instance of dizziness while lying in bed talking witnessed by provider that was not characteristic of previous episodes of dizziness associated with change in positioning. Wife reports extensive work-up with [facility] however without any cardiac/neurologic diagnosis; unable to view these records in Care Everywhere. MRI head w/o 09/2020 showed general atrophy and chronic microvascular disease. Patient was started on Fludrocortisone 0.05mg  during admission to [facility] which was changed to Midrodrine 5mg  TID with reported improvement in orthostatic symptoms, however they have been interfering with OT/PT sessions.   - continue midodrine 5mg  TID  - compression hoses  - Fluid boluses PRN for orthostatic hypotension   - 03/03/21 TTE with borderline EF, otherwise unremarkable   - Unable to obtain orthostatic vitals as unable to stand due to pain  - Repeat MRI and MRA   ??    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 Now on ADT s/p degarelix 11/2020 and will changed to relugolix 01/17/2021 and enzalutamide 02/01/21  (per ENZAMET and ARCHES). Enzalutamide on hold as possibly contributing to  falls.  -notified Dr. Okey Dupre of admission   -wife brought in relugolix (orgovyx) from home  -home Vit D/Calcium supplement    ??  AKI CKD IIIb: Baseline Cr 1.2-1.4. Creatinine increased and peaked at 2.46, currently downtrending. FeNa 0.3% so pre-renal, likely from decreased PO intake & contrast-induced nephropathy (CT on 4/4). Continues to improve.  -Promote PO intake  -avoid nephrotoxic medications     Delirium  Has been having visual and auditory hallucinations.  - Delirium precautions  - Sleep holiday  - Decrease # of unnecessary lines  - Stopping Marinol due to AE's as below  ??  Severe Protein-Calorie Malnutrition in the context of chronic illness (03/06/21 1051)  Energy Intake: < or equal to 75% of estimated energy requirement for > or equal to 1 month  Interpretation of Wt. Loss: > 7.5% x 3 month  Fat Loss: Severe  Muscle Loss: Severe  Malnutrition Score: 4  Patient with decreased PO intake with blood sugars in the 50-60s in the past few days. Wife denies any problem with appetite prior to admission to [facility] in March 2022.   -nutrition consulted, appreciate recs  -stopping home Marinol (recently started during [facility] admission, has AE's of dizziness, confusion, and hallucinations)  -previously had been on mirtazapine 15mg  for psychiatric reasons and allergy listed as increased anxiety, memory, confusion; however has not been trialed at 7.5mg  for appetite as per wife  -megace a possibility however is on [facility] list and is associated with cardiac AE's   -added super shakes TID  -Calorie count  -D5LR  -CTM and assess need for TF's as cognition improves with weaning down of oxycontin and stopping marinol    Anxiety:   - Increased home citalopram 20mg  q48h to 20mg  daily for therapeutic dose (latest RX written as 40mg  daily however SNF medication list reports 20mg  q48h)  - Trazodone PRN     BLE Edema: negative dopplers during admission to [facility] on 3/25. Worse in left ankle, XR negative for fracture.   -hold daily lasix 40mg  daily in setting of hypotension     CAD s/p CABG: aspirin, rosuvastatin   ??  Hx PUD, UGIB: protonix BID daily   ??    Daily Checklist:  Diet: Regular Diet  DVT PPx: Lovenox 40mg  q24h  Electrolytes: No Repletion Needed  Code Status: Full Code  Dispo: SNF    Team Contact Information:   Primary Team: Oncology (MEDO)  Primary Resident: Pearline Cables, MD  Resident's Pager: [phone] (Oncology Intern - Cliffton Asters)    Interval History:   No acute events overnight.    Continues to not require any PRN oxycodone however observed to have myoclonic jerks so will wean down oxycontin now that pain is improving. Has not been eating much due to decreased appetite. Wife updated at bedside.     Objective:   Temp:  [36.2 ??C-37.1 ??C] 36.2 ??C  Heart Rate:  [59-75] 65  Resp:  [16-18] 16  BP: (116-139)/(56-71) 139/70  SpO2:  [94 %-99 %] 98 %    Gen: Thin elderly gentleman in NAD, answers questions appropriately  Eyes: sclera anicteric, EOMI  HENT: atraumatic, dry mucous membranes  Heart: RRR, S1, S2, no M/R/G, tender to palpation over lower right thoracic cage  Lungs: CTAB, no crackles or wheezes, no use of accessory muscles  Abdomen: Normoactive bowel sounds, soft, NTND, no rebound/guarding  Extremities: no clubbing, cyanosis, or edema in the BLEs; dressing over R hip  Neuro/Psych: Alert, oriented to self, year only, appears anxious, intermittent  myoclonic jerks in all 4 extremities    Labs/Studies: Labs and Studies from the last 24hrs per EMR and Reviewed

## 2021-03-07 LAB — BASIC METABOLIC PANEL
ANION GAP: 6 mmol/L (ref 5–14)
BLOOD UREA NITROGEN: 39 mg/dL — ABNORMAL HIGH (ref 9–23)
BUN / CREAT RATIO: 34
CALCIUM: 8 mg/dL — ABNORMAL LOW (ref 8.7–10.4)
CHLORIDE: 113 mmol/L — ABNORMAL HIGH (ref 98–107)
CO2: 23 mmol/L (ref 20.0–31.0)
CREATININE: 1.15 mg/dL — ABNORMAL HIGH
EGFR CKD-EPI AA MALE: 72 mL/min/{1.73_m2} (ref >=60)
EGFR CKD-EPI NON-AA MALE: 62 mL/min/{1.73_m2} (ref >=60)
GLUCOSE RANDOM: 68 mg/dL — ABNORMAL LOW (ref 70–179)
POTASSIUM: 3.7 mmol/L (ref 3.4–4.8)
SODIUM: 142 mmol/L (ref 135–145)

## 2021-03-07 LAB — HEPATIC FUNCTION PANEL
ALBUMIN: 2 g/dL — ABNORMAL LOW (ref 3.4–5.0)
ALKALINE PHOSPHATASE: 507 U/L — ABNORMAL HIGH (ref 46–116)
ALT (SGPT): <7 U/L — ABNORMAL LOW (ref 10–49)
AST (SGOT): 15 U/L (ref <=34)
BILIRUBIN DIRECT: 0.5 mg/dL — ABNORMAL HIGH (ref 0.00–0.30)
BILIRUBIN TOTAL: 0.9 mg/dL (ref 0.3–1.2)
PROTEIN TOTAL: 4.5 g/dL — ABNORMAL LOW (ref 5.7–8.2)

## 2021-03-07 LAB — CBC W/ AUTO DIFF
BASOPHILS ABSOLUTE COUNT: 0 10*9/L (ref 0.0–0.1)
BASOPHILS RELATIVE PERCENT: 0.4 %
EOSINOPHILS ABSOLUTE COUNT: 0.2 10*9/L (ref 0.0–0.5)
EOSINOPHILS RELATIVE PERCENT: 2.5 %
HEMATOCRIT: 28.5 % — ABNORMAL LOW (ref 39.0–48.0)
HEMOGLOBIN: 9.7 g/dL — ABNORMAL LOW (ref 12.9–16.5)
LYMPHOCYTES ABSOLUTE COUNT: 1 10*9/L — ABNORMAL LOW (ref 1.1–3.6)
LYMPHOCYTES RELATIVE PERCENT: 11.7 %
MEAN CORPUSCULAR HEMOGLOBIN CONC: 33.9 g/dL (ref 32.0–36.0)
MEAN CORPUSCULAR HEMOGLOBIN: 30.3 pg (ref 25.9–32.4)
MEAN CORPUSCULAR VOLUME: 89.3 fL (ref 77.6–95.7)
MEAN PLATELET VOLUME: 7.2 fL (ref 6.8–10.7)
MONOCYTES ABSOLUTE COUNT: 0.6 10*9/L (ref 0.3–0.8)
MONOCYTES RELATIVE PERCENT: 6.5 %
NEUTROPHILS ABSOLUTE COUNT: 6.9 10*9/L (ref 1.8–7.8)
NEUTROPHILS RELATIVE PERCENT: 78.9 %
PLATELET COUNT: 206 10*9/L (ref 150–450)
RED BLOOD CELL COUNT: 3.19 10*12/L — ABNORMAL LOW (ref 4.26–5.60)
RED CELL DISTRIBUTION WIDTH: 17.4 % — ABNORMAL HIGH (ref 12.2–15.2)
WBC ADJUSTED: 8.7 10*9/L (ref 3.6–11.2)

## 2021-03-07 LAB — MAGNESIUM: MAGNESIUM: 1.7 mg/dL (ref 1.6–2.6)

## 2021-03-07 LAB — PHOSPHORUS: PHOSPHORUS: 2.3 mg/dL — ABNORMAL LOW (ref 2.4–5.1)

## 2021-03-07 LAB — CORTISOL: CORTISOL TOTAL: 22.2 ug/dL

## 2021-03-07 MED ADMIN — oxyCODONE (OXYCONTIN) 12 hr crush resistant ER/CR tablet 10 mg: 10 mg | ORAL | @ 13:00:00 | Stop: 2021-03-14

## 2021-03-07 MED ADMIN — thiamine mononitrate (vit B1) tablet 200 mg: 200 mg | ORAL | @ 13:00:00 | Stop: 2021-03-11

## 2021-03-07 MED ADMIN — Relugolix (ORGOVYX) tablet 120 mg **PATIENT SUPPLIED**: 120 mg | ORAL | @ 14:00:00

## 2021-03-07 MED ADMIN — aspirin chewable tablet 81 mg: 81 mg | ORAL | @ 13:00:00

## 2021-03-07 MED ADMIN — ascorbic acid (vitamin C) (VITAMIN C) tablet 500 mg: 500 mg | ORAL | @ 13:00:00

## 2021-03-07 MED ADMIN — magnesium oxide (MAG-OX) tablet 400 mg: 400 mg | ORAL | @ 13:00:00

## 2021-03-07 MED ADMIN — ondansetron (ZOFRAN) injection 4 mg: 4 mg | INTRAVENOUS | @ 14:00:00

## 2021-03-07 MED ADMIN — senna (SENOKOT) tablet 2 tablet: 2 | ORAL | @ 01:00:00

## 2021-03-07 MED ADMIN — magnesium oxide (MAG-OX) tablet 400 mg: 400 mg | ORAL | @ 01:00:00

## 2021-03-07 MED ADMIN — midodrine (PROAMATINE) tablet 5 mg: 5 mg | ORAL | @ 13:00:00 | Stop: 2021-03-07

## 2021-03-07 MED ADMIN — citalopram (CeleXA) tablet 20 mg: 20 mg | ORAL | @ 13:00:00

## 2021-03-07 MED ADMIN — pantoprazole (PROTONIX) EC tablet 40 mg: 40 mg | ORAL | @ 13:00:00 | Stop: 2021-03-07

## 2021-03-07 MED ADMIN — acetaminophen (TYLENOL) tablet 1,000 mg: 1000 mg | ORAL | @ 02:00:00

## 2021-03-07 MED ADMIN — pantoprazole (PROTONIX) EC tablet 40 mg: 40 mg | ORAL | @ 01:00:00

## 2021-03-07 MED ADMIN — acetaminophen (TYLENOL) tablet 1,000 mg: 1000 mg | ORAL | @ 10:00:00

## 2021-03-07 MED ADMIN — ergocalciferol-1,250 mcg (50,000 unit) (DRISDOL) capsule 1,250 mcg: 1250 ug | ORAL | @ 13:00:00

## 2021-03-07 MED ADMIN — oxyCODONE (OXYCONTIN) 12 hr crush resistant ER/CR tablet 10 mg: 10 mg | ORAL | @ 01:00:00 | Stop: 2021-03-14

## 2021-03-07 MED ADMIN — midodrine (PROAMATINE) tablet 5 mg: 5 mg | ORAL | @ 01:00:00

## 2021-03-07 MED ADMIN — acetaminophen (TYLENOL) tablet 1,000 mg: 1000 mg | ORAL | @ 19:00:00

## 2021-03-07 MED ADMIN — polyethylene glycol (MIRALAX) packet 17 g: 17 g | ORAL | @ 01:00:00

## 2021-03-07 MED ADMIN — dextrose 5 % and sodium chloride 0.45 % infusion: 50 mL/h | INTRAVENOUS | @ 13:00:00 | Stop: 2021-03-08

## 2021-03-07 MED ADMIN — thiamine mononitrate (vit B1) tablet 200 mg: 200 mg | ORAL | @ 19:00:00 | Stop: 2021-03-11

## 2021-03-07 MED ADMIN — enoxaparin (LOVENOX) syringe 30 mg: 30 mg | SUBCUTANEOUS | @ 13:00:00 | Stop: 2021-03-07

## 2021-03-07 MED ADMIN — thiamine mononitrate (vit B1) tablet 200 mg: 200 mg | ORAL | @ 01:00:00 | Stop: 2021-03-11

## 2021-03-07 MED ADMIN — polyethylene glycol (MIRALAX) packet 17 g: 17 g | ORAL | @ 13:00:00

## 2021-03-07 NOTE — Unmapped (Signed)
Oncology (MEDO) Progress Note    Assessment & Plan:   Douglas Gray is a 75 y.o. 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 *    Orthostatic Hypotension: BP WNL in last 24H; official diagnosis is still not clear; no obvious neurologic, cardiogenic, endocrine etiology. TTE w/ EF 50-55%, otherwise unremarkable; AM cort normal.  - Stop midodrine with normal blood pressures.   - compression hoses  - Fluid boluses PRN for orthostatic hypotension   - Repeat MRI and MRA today    Right??pathologic greater trochanteric femur fracture with possible intertrochanteric extension: History of progressive R hip pain over the last 6-7 months. Found to have a R femur fracture on admission, reportedly new from 3/25 R femur XR completed at [facility]. S/p fixation on 4/6. Pain improving as per patient  -Pain control:home regimen of 10mg  BID due to myoclonic jerks and cognitive clouding with IV Dilaudid available  -Plan for postop palliative radiation outpatient  ??  Pathologic Right 6-10th rib Fractures I Small Pneumothorax    No oxygen requirement   -pain control as above  -supplemental oxygen for SpO2 >90%, not needing currently   ??  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 Now on ADT s/p degarelix 11/2020 and will changed to relugolix 01/17/2021 and enzalutamide 02/01/21  (per ENZAMET and ARCHES). Enzalutamide on hold as possibly contributing to falls.  -notified Dr. Okey Dupre of admission   - Continue home relugolix (orgovyx) from home  -home Vit D/Calcium supplement    ??  AKI on CKD IIIb: Improved; Cr now at baseline.   -Promote PO intake  -avoid nephrotoxic medications     Delirium with hallucinations - ?Cognitive impairment. Could be 2/2 marinol, polypharmacy, or may be another chronic underlying process like cognitive impairment/dementia. 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   - Delirium precautions  - Sleep holiday  - Decrease # of unnecessary lines  - Could consider neuro consult  ??  Severe Protein-Calorie Malnutrition in the context of chronic illness (03/06/21 1051) - Hypoglycemia  Energy Intake: < or equal to 75% of estimated energy requirement for > or equal to 1 month  Interpretation of Wt. Loss: > 7.5% x 3 month  Fat Loss: Severe  Muscle Loss: Severe  Malnutrition Score: 4  Glucse typically 40s-60s. Wife denies any problem with appetite prior to admission to [facility] in March 2022. AM cortisol WNL.   -nutrition consulted, appreciate recs  -  Stopped marinol 2/2 neuropsychiatric s/e. Cannot use remeron 2/2 prior AE, and megace contraindicated w/ age and cardiac dz  -Calorie count  -D5LR  -CTM and assess need for TF's as cognition improves with weaning down of oxycontin and stopping marinol    Stable chronic problems  CAD s/p CABG: aspirin, rosuvastatin   Hx PUD, UGIB: protonix BID daily ??  S/p bioprosthetic AVR: cont ASA  LLE Edema: Stable. 2/2 saphenous vein harvested for CABG  Anxiety: Home Celexa 20 mg daily    Daily Checklist:  Diet: Regular Diet  DVT PPx: Lovenox 40mg  q24h  Electrolytes: No Repletion Needed  Code Status: Full Code  Dispo: SNF    Team Contact Information:   Primary Team: Oncology (MEDO)  Primary Resident: Naoma Diener, MD  Resident's Pager: [phone] (Oncology Intern - Cliffton Asters)    Interval History:   No acute events overnight.    Complaining of mild shin pain b/l, worse with palpation. No dizziness. No CP, SOB. Still having hallucinations of bugs and children in the room.  Slightly poor appetite and decreased oral intake.    Objective:   Temp:  [36.4 ??C-36.6 ??C] 36.5 ??C  Heart Rate:  [64-75] 67  Resp:  [16-18] 16  BP: (116-159)/(56-77) 138/69  SpO2:  [93 %-100 %] 96 %    Gen: Thin elderly gentleman in NAD, answers questions appropriately  Eyes: sclera anicteric, EOMI  HENT: atraumatic, dry mucous membranes  Heart: RRR, S1, S2, no 5/6 systolic murmur best auscultated at LUSB, radiating to carotids.  Lungs: CTAB, no crackles or wheezes, no use of accessory muscles  Abdomen: Normoactive bowel sounds, soft, NTND, no rebound/guarding  Extremities: no clubbing, cyanosis, or edema in the BLEs; dressing over R hip.  Mild TTP across shins in B/L LE  Neuro/Psych: Alert, oriented to self, year only, appears anxious, intermittent myoclonic jerks in all 4 extremities.  Fails attention testing.    Labs/Studies: Labs and Studies from the last 24hrs per EMR and Reviewed

## 2021-03-07 NOTE — Unmapped (Signed)
Aox2, afebrile, & free of falls/injuries during shift. Elevated BP but WNL & other VSS. No c/o pain. Pitting edema LLE > RLE- MD notified & no new orders. Pt slept on & off throughout night. No further concerns at this time.     Problem: Skin Injury Risk Increased  Goal: Skin Health and Integrity  Outcome: Progressing  Intervention: Optimize Skin Protection  Recent Flowsheet Documentation  Taken 03/06/2021 2055 by Harley Alto, RN  Pressure Reduction Techniques: frequent weight shift encouraged  Pressure Reduction Devices: pressure-redistributing mattress utilized  Skin Protection: incontinence pads utilized     Problem: Impaired Wound Healing  Goal: Optimal Wound Healing  Outcome: Progressing  Intervention: Promote Wound Healing  Recent Flowsheet Documentation  Taken 03/06/2021 2055 by Harley Alto, RN  Activity Management: bedrest     Problem: Fall Injury Risk  Goal: Absence of Fall and Fall-Related Injury  Outcome: Progressing  Intervention: Promote Injury-Free Environment  Recent Flowsheet Documentation  Taken 03/06/2021 2055 by Harley Alto, RN  Safety Interventions:  ??? bed alarm  ??? bleeding precautions  ??? commode/urinal/bedpan at bedside  ??? fall reduction program maintained  ??? infection management  ??? aspiration precautions  ??? lighting adjusted for tasks/safety  ??? low bed  ??? nonskid shoes/slippers when out of bed     Problem: Adult Inpatient Plan of Care  Goal: Plan of Care Review  Outcome: Progressing  Goal: Patient-Specific Goal (Individualized)  Outcome: Progressing  Goal: Absence of Hospital-Acquired Illness or Injury  Outcome: Progressing  Intervention: Identify and Manage Fall Risk  Recent Flowsheet Documentation  Taken 03/06/2021 2055 by Harley Alto, RN  Safety Interventions:  ??? bed alarm  ??? bleeding precautions  ??? commode/urinal/bedpan at bedside  ??? fall reduction program maintained  ??? infection management  ??? aspiration precautions  ??? lighting adjusted for tasks/safety  ??? low bed  ??? nonskid shoes/slippers when out of bed  Intervention: Prevent Skin Injury  Recent Flowsheet Documentation  Taken 03/06/2021 2055 by Harley Alto, RN  Skin Protection: incontinence pads utilized  Intervention: Prevent and Manage VTE (Venous Thromboembolism) Risk  Recent Flowsheet Documentation  Taken 03/06/2021 2055 by Harley Alto, RN  Activity Management: bedrest  Intervention: Prevent Infection  Recent Flowsheet Documentation  Taken 03/06/2021 2055 by Harley Alto, RN  Infection Prevention:  ??? hand hygiene promoted  ??? single patient room provided  Goal: Optimal Comfort and Wellbeing  Outcome: Progressing  Goal: Readiness for Transition of Care  Outcome: Progressing  Goal: Rounds/Family Conference  Outcome: Progressing     Problem: Self-Care Deficit  Goal: Improved Ability to Complete Activities of Daily Living  Outcome: Progressing

## 2021-03-07 NOTE — Unmapped (Signed)
[facility]ING [facility]ARE [facility]WORK  Transition Note      Patient transitioned from [facility] to inpatient on 02/27/2021 due to hypotension and uncontrolled pain. Patient has completed case management services through the [facility].    Care Coordination Note updates in [facility]: Yes

## 2021-03-08 LAB — MAGNESIUM: MAGNESIUM: 1.7 mg/dL (ref 1.6–2.6)

## 2021-03-08 LAB — CBC W/ AUTO DIFF
BASOPHILS ABSOLUTE COUNT: 0 10*9/L (ref 0.0–0.1)
BASOPHILS RELATIVE PERCENT: 0.3 %
EOSINOPHILS ABSOLUTE COUNT: 0.2 10*9/L (ref 0.0–0.5)
EOSINOPHILS RELATIVE PERCENT: 2.1 %
HEMATOCRIT: 28.7 % — ABNORMAL LOW (ref 39.0–48.0)
HEMOGLOBIN: 9.8 g/dL — ABNORMAL LOW (ref 12.9–16.5)
LYMPHOCYTES ABSOLUTE COUNT: 0.9 10*9/L — ABNORMAL LOW (ref 1.1–3.6)
LYMPHOCYTES RELATIVE PERCENT: 11.4 %
MEAN CORPUSCULAR HEMOGLOBIN CONC: 34.1 g/dL (ref 32.0–36.0)
MEAN CORPUSCULAR HEMOGLOBIN: 30.5 pg (ref 25.9–32.4)
MEAN CORPUSCULAR VOLUME: 89.5 fL (ref 77.6–95.7)
MEAN PLATELET VOLUME: 6.8 fL (ref 6.8–10.7)
MONOCYTES ABSOLUTE COUNT: 0.6 10*9/L (ref 0.3–0.8)
MONOCYTES RELATIVE PERCENT: 6.8 %
NEUTROPHILS ABSOLUTE COUNT: 6.5 10*9/L (ref 1.8–7.8)
NEUTROPHILS RELATIVE PERCENT: 79.4 %
PLATELET COUNT: 214 10*9/L (ref 150–450)
RED BLOOD CELL COUNT: 3.2 10*12/L — ABNORMAL LOW (ref 4.26–5.60)
RED CELL DISTRIBUTION WIDTH: 17.9 % — ABNORMAL HIGH (ref 12.2–15.2)
WBC ADJUSTED: 8.2 10*9/L (ref 3.6–11.2)

## 2021-03-08 LAB — BASIC METABOLIC PANEL
ANION GAP: 5 mmol/L (ref 5–14)
BLOOD UREA NITROGEN: 29 mg/dL — ABNORMAL HIGH (ref 9–23)
BUN / CREAT RATIO: 28
CALCIUM: 8 mg/dL — ABNORMAL LOW (ref 8.7–10.4)
CHLORIDE: 111 mmol/L — ABNORMAL HIGH (ref 98–107)
CO2: 25 mmol/L (ref 20.0–31.0)
CREATININE: 1.03 mg/dL
EGFR CKD-EPI AA MALE: 82 mL/min/{1.73_m2} (ref >=60)
EGFR CKD-EPI NON-AA MALE: 71 mL/min/{1.73_m2} (ref >=60)
GLUCOSE RANDOM: 83 mg/dL (ref 70–179)
POTASSIUM: 3.7 mmol/L (ref 3.4–4.8)
SODIUM: 141 mmol/L (ref 135–145)

## 2021-03-08 LAB — PHOSPHORUS: PHOSPHORUS: 2 mg/dL — ABNORMAL LOW (ref 2.4–5.1)

## 2021-03-08 MED ADMIN — magnesium oxide (MAG-OX) tablet 400 mg: 400 mg | ORAL | @ 01:00:00

## 2021-03-08 MED ADMIN — pantoprazole (PROTONIX) EC tablet 40 mg: 40 mg | ORAL | @ 15:00:00

## 2021-03-08 MED ADMIN — thiamine mononitrate (vit B1) tablet 200 mg: 200 mg | ORAL | @ 18:00:00 | Stop: 2021-03-11

## 2021-03-08 MED ADMIN — enoxaparin (LOVENOX) syringe 40 mg: 40 mg | SUBCUTANEOUS | @ 15:00:00

## 2021-03-08 MED ADMIN — thiamine mononitrate (vit B1) tablet 200 mg: 200 mg | ORAL | @ 14:00:00 | Stop: 2021-03-11

## 2021-03-08 MED ADMIN — oxyCODONE (OXYCONTIN) 12 hr crush resistant ER/CR tablet 10 mg: 10 mg | ORAL | @ 01:00:00 | Stop: 2021-03-14

## 2021-03-08 MED ADMIN — acetaminophen (TYLENOL) tablet 1,000 mg: 1000 mg | ORAL | @ 02:00:00

## 2021-03-08 MED ADMIN — acetaminophen (TYLENOL) tablet 1,000 mg: 1000 mg | ORAL | @ 09:00:00

## 2021-03-08 MED ADMIN — magnesium oxide (MAG-OX) tablet 400 mg: 400 mg | ORAL | @ 15:00:00

## 2021-03-08 MED ADMIN — senna (SENOKOT) tablet 2 tablet: 2 | ORAL | @ 01:00:00

## 2021-03-08 MED ADMIN — melatonin tablet 3 mg: 3 mg | ORAL | @ 01:00:00

## 2021-03-08 MED ADMIN — citalopram (CeleXA) tablet 20 mg: 20 mg | ORAL | @ 14:00:00

## 2021-03-08 MED ADMIN — oxyCODONE (ROXICODONE) immediate release tablet 5 mg: 5 mg | ORAL | @ 14:00:00 | Stop: 2021-03-14

## 2021-03-08 MED ADMIN — thiamine mononitrate (vit B1) tablet 200 mg: 200 mg | ORAL | @ 01:00:00 | Stop: 2021-03-11

## 2021-03-08 MED ADMIN — Relugolix (ORGOVYX) tablet 120 mg **PATIENT SUPPLIED**: 120 mg | ORAL | @ 13:00:00

## 2021-03-08 MED ADMIN — acetaminophen (TYLENOL) tablet 1,000 mg: 1000 mg | ORAL | @ 18:00:00

## 2021-03-08 MED ADMIN — dextrose 5 % and sodium chloride 0.45 % infusion: 50 mL/h | INTRAVENOUS | @ 09:00:00 | Stop: 2021-03-08

## 2021-03-08 MED ADMIN — aspirin chewable tablet 81 mg: 81 mg | ORAL | @ 15:00:00

## 2021-03-08 MED ADMIN — ascorbic acid (vitamin C) (VITAMIN C) tablet 500 mg: 500 mg | ORAL | @ 14:00:00

## 2021-03-08 MED ADMIN — polyethylene glycol (MIRALAX) packet 17 g: 17 g | ORAL | @ 15:00:00

## 2021-03-08 NOTE — Unmapped (Signed)
Pt alert and oriented x4, bedrest. Pt has been afebrile with stable VS. Pt with c/o R sided hip and rib pain s/p fx. Pt with active bowel sounds and diminished urine output. R hip surgical dressing changed scant amt of serous fluid, staples c/d/i. Mepilex to sacrum c/d/i. L elbow skin tear with mepilex c/d/I. Pt with poor appetite, taking a sm of amt of food and supplements with encouragement. Denies visual hallucinations.  No new skin breakdown this shift. No s/s infection this shift. Fall precautions and pt safety maintained. Will continue to monitor. Wife in to visit x2 hours, supportive.      Problem: Skin Injury Risk Increased  Goal: Skin Health and Integrity  Outcome: Ongoing - Unchanged  Intervention: Optimize Skin Protection  Recent Flowsheet Documentation  Taken 03/08/2021 1400 by Linna Darner, RN  Pressure Reduction Techniques:   frequent weight shift encouraged   heels elevated off bed   positioned off wounds  Pressure Reduction Devices: pressure-redistributing mattress utilized  Skin Protection:   adhesive use limited   incontinence pads utilized

## 2021-03-08 NOTE — Unmapped (Signed)
Aox2-3, VSS, afebrile, & free of falls/injuries during shift. Some c/o pain on R side, but declined additional interventions aside from scheduled pain meds. Cont fluids maintained. R arm dressing changed per orders. Pt slept on & off throughout night. Pending MRI/MRA. No further concerns at this time.     Problem: Skin Injury Risk Increased  Goal: Skin Health and Integrity  Outcome: Progressing  Intervention: Optimize Skin Protection  Recent Flowsheet Documentation  Taken 03/07/2021 2044 by Harley Alto, RN  Pressure Reduction Techniques: frequent weight shift encouraged  Pressure Reduction Devices: pressure-redistributing mattress utilized  Skin Protection: incontinence pads utilized     Problem: Impaired Wound Healing  Goal: Optimal Wound Healing  Outcome: Progressing  Intervention: Promote Wound Healing  Recent Flowsheet Documentation  Taken 03/07/2021 2044 by Harley Alto, RN  Activity Management: bedrest     Problem: Fall Injury Risk  Goal: Absence of Fall and Fall-Related Injury  Outcome: Progressing  Intervention: Promote Injury-Free Environment  Recent Flowsheet Documentation  Taken 03/07/2021 2044 by Harley Alto, RN  Safety Interventions:  ??? bed alarm  ??? bleeding precautions  ??? aspiration precautions  ??? commode/urinal/bedpan at bedside  ??? fall reduction program maintained  ??? infection management  ??? lighting adjusted for tasks/safety  ??? low bed  ??? nonskid shoes/slippers when out of bed     Problem: Adult Inpatient Plan of Care  Goal: Plan of Care Review  Outcome: Progressing  Goal: Patient-Specific Goal (Individualized)  Outcome: Progressing  Goal: Absence of Hospital-Acquired Illness or Injury  Intervention: Identify and Manage Fall Risk  Recent Flowsheet Documentation  Taken 03/07/2021 2044 by Harley Alto, RN  Safety Interventions:  ??? bed alarm  ??? bleeding precautions  ??? aspiration precautions  ??? commode/urinal/bedpan at bedside  ??? fall reduction program maintained  ??? infection management  ??? lighting adjusted for tasks/safety  ??? low bed  ??? nonskid shoes/slippers when out of bed  Intervention: Prevent Skin Injury  Recent Flowsheet Documentation  Taken 03/07/2021 2044 by Harley Alto, RN  Skin Protection: incontinence pads utilized  Intervention: Prevent and Manage VTE (Venous Thromboembolism) Risk  Recent Flowsheet Documentation  Taken 03/07/2021 2044 by Harley Alto, RN  Activity Management: bedrest  Intervention: Prevent Infection  Recent Flowsheet Documentation  Taken 03/07/2021 2044 by Harley Alto, RN  Infection Prevention:  ??? hand hygiene promoted  ??? single patient room provided  Goal: Optimal Comfort and Wellbeing  Outcome: Progressing  Goal: Readiness for Transition of Care  Outcome: Progressing  Goal: Rounds/Family Conference  Outcome: Progressing     Problem: Self-Care Deficit  Goal: Improved Ability to Complete Activities of Daily Living  Outcome: Progressing

## 2021-03-08 NOTE — Unmapped (Signed)
Pt on calorie count.  Continues to have poor po intake.  D5 1/2 NS running at 50 ml/hr to supplement d/t low blood sugar.  RAC PIV infusing with no s/s of complications.  C/O no pain during this shift.  Waiting for MRI/MRA.  Passed 30 min trial laying flat in bed to judge if pt can tolerate laying flat on MRI table.  Will need pain medication prior to MRI.  Pt continues to have orthostatic BP.  Sat up on the side of bed while taking morning meds.  C/o dizziness and nausea.  Zofran given for nausea to prevent emesis after med pass.  Worked with PT.  Also limited d/t orthostatic BP and dizziness.  Hallucinations seem to be improving.  Pt is A&O x 2/3.  Mental status waxes and wanes during the day especially when waking from sleep.  Wife at bedside in the afternoon.  Monitoring

## 2021-03-08 NOTE — Unmapped (Signed)
Oncology (MEDO) Progress Note    Assessment & Plan:   Douglas Gray is a 75 y.o. 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 *    Orthostatic Hypotension: BP WNL in last 24H after stopping midodrine; official diagnosis is still not clear; no obvious neurologic, cardiogenic, endocrine etiology. TTE w/ EF 50-55%, otherwise unremarkable; AM cort normal.  - compression hoses  - Fluid boluses PRN for orthostatic hypotension   -Would like to obtain MRI/MRA, but this is limited by broken equipment in the hospital    Right??pathologic greater trochanteric femur fracture with possible intertrochanteric extension: History of progressive R hip pain over the last 6-7 months. Found to have a R femur fracture on admission, reportedly new from 3/25 R femur XR completed at [facility]. S/p fixation on 4/6. Pain improving daily.  -Stop scheduled oxycodone; will treat only with as needed oxycodone pain control  -Plan for postop palliative radiation outpatient  ??  Pathologic Right 6-10th rib Fractures I Small Pneumothorax    No oxygen requirement   -pain control as above  -supplemental oxygen for SpO2 >90%, not needing currently   ??  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 Now on ADT s/p degarelix 11/2020 and will changed to relugolix 01/17/2021 and enzalutamide 02/01/21  (per ENZAMET and [facility]CHES). Enzalutamide on hold as possibly contributing to falls.  -will discuss restarting enzalutamide as outpatient  - Continue home relugolix (orgovyx) from home  -home Vit D/Calcium supplement    ??  AKI on CKD IIIb: Improved; Cr now at baseline.   -Promote PO intake  -avoid nephrotoxic medications     Delirium with hallucinations - ?Cognitive impairment.  Symptoms improved slightly today, which could be simply 2/2 waxing waning delirium or could be genuine improvement from being off of Marinol.  Overall, feel that this could be  be 2/2 marinol, polypharmacy, or may be another chronic underlying process like cognitive impairment/dementia   - Delirium precautions  - Sleep holiday  - Decrease # of unnecessary lines  -MRI/MRA  - Could consider neuro consult  ??  Severe Protein-Calorie Malnutrition in the context of chronic illness (03/06/21 1051) - Hypoglycemia  Energy Intake: < or equal to 75% of estimated energy requirement for > or equal to 1 month  Interpretation of Wt. Loss: > 7.5% x 3 month  Fat Loss: Severe  Muscle Loss: Severe  Malnutrition Score: 4  Glucse typically 40s-60s. Wife denies any problem with appetite prior to  admission to [facility] in March 2022. AM cortisol WNL.   -nutrition consulted, appreciate recs.    - Stopped marinol 2/2 neuropsychiatric s/e. Cannot use remeron 2/2 prior AE, and megace contraindicated w/ age and cardiac dz  -D5LR  -Not meeting nutritional needs with calorie count, so may consider discussing enteral feeding with family    Stable chronic problems  CAD s/p CABG: aspirin, rosuvastatin   Hx PUD, UGIB: protonix daily ??  S/p bioprosthetic AVR: cont ASA  LLE Edema: Stable. 2/2 saphenous vein harvested for CABG  Anxiety: Home Celexa 20 mg daily    Daily Checklist:  Diet: Regular Diet  DVT PPx: Lovenox 40mg  q24h  Electrolytes: No Repletion Needed  Code Status: Full Code  Dispo: SNF    Team Contact Information:   Primary Team: Oncology (MEDO)  Primary Resident: Naoma Diener, MD  Resident's Pager: [phone] (Oncology Intern - Cliffton Asters)    Interval History:   No acute events overnight.    Still complaining of some dizziness which is limiting work with PT/OT.  No longer having shin pain.  No chest pain, shortness of breath.  More alert this morning.    Objective:   Temp:  [36.5 ??C-36.8 ??C] 36.8 ??C  Heart Rate:  [60-72] 67  Resp:  [16-18] 16  BP: (121-148)/(54-67) 121/54  SpO2:  [95 %-100 %] 95 %    Gen: Thin elderly gentleman in NAD, answers questions appropriately  Eyes: sclera anicteric, EOMI  HENT: atraumatic, dry mucous membranes  Heart: RRR, S1, S2, no 5/6 systolic murmur best auscultated at LUSB, radiating to carotids.  Lungs: CTAB, no crackles or wheezes, no use of accessory muscles  Abdomen: Normoactive bowel sounds, soft, NTND, no rebound/guarding  Extremities: no clubbing, cyanosis, or edema in the BLEs; dressing over R hip.  Neuro/Psych: Alert and oriented x4.  Feels attention testing. intermittent myoclonic jerks in all 4 extremities    Labs/Studies: Labs and Studies from the last 24hrs per EMR and Reviewed

## 2021-03-09 LAB — BASIC METABOLIC PANEL
ANION GAP: 8 mmol/L (ref 5–14)
BLOOD UREA NITROGEN: 26 mg/dL — ABNORMAL HIGH (ref 9–23)
BUN / CREAT RATIO: 26
CALCIUM: 8 mg/dL — ABNORMAL LOW (ref 8.7–10.4)
CHLORIDE: 111 mmol/L — ABNORMAL HIGH (ref 98–107)
CO2: 22 mmol/L (ref 20.0–31.0)
CREATININE: 0.99 mg/dL
EGFR CKD-EPI AA MALE: 86 mL/min/{1.73_m2} (ref >=60)
EGFR CKD-EPI NON-AA MALE: 74 mL/min/{1.73_m2} (ref >=60)
GLUCOSE RANDOM: 75 mg/dL (ref 70–179)
POTASSIUM: 3.8 mmol/L (ref 3.4–4.8)
SODIUM: 141 mmol/L (ref 135–145)

## 2021-03-09 LAB — CBC W/ AUTO DIFF
BASOPHILS ABSOLUTE COUNT: 0 10*9/L (ref 0.0–0.1)
BASOPHILS RELATIVE PERCENT: 0.5 %
EOSINOPHILS ABSOLUTE COUNT: 0.2 10*9/L (ref 0.0–0.5)
EOSINOPHILS RELATIVE PERCENT: 1.9 %
HEMATOCRIT: 30.7 % — ABNORMAL LOW (ref 39.0–48.0)
HEMOGLOBIN: 10.1 g/dL — ABNORMAL LOW (ref 12.9–16.5)
LYMPHOCYTES ABSOLUTE COUNT: 1 10*9/L — ABNORMAL LOW (ref 1.1–3.6)
LYMPHOCYTES RELATIVE PERCENT: 11.1 %
MEAN CORPUSCULAR HEMOGLOBIN CONC: 32.8 g/dL (ref 32.0–36.0)
MEAN CORPUSCULAR HEMOGLOBIN: 29.5 pg (ref 25.9–32.4)
MEAN CORPUSCULAR VOLUME: 89.9 fL (ref 77.6–95.7)
MEAN PLATELET VOLUME: 7.1 fL (ref 6.8–10.7)
MONOCYTES ABSOLUTE COUNT: 0.5 10*9/L (ref 0.3–0.8)
MONOCYTES RELATIVE PERCENT: 6 %
NEUTROPHILS ABSOLUTE COUNT: 7.2 10*9/L (ref 1.8–7.8)
NEUTROPHILS RELATIVE PERCENT: 80.5 %
PLATELET COUNT: 247 10*9/L (ref 150–450)
RED BLOOD CELL COUNT: 3.42 10*12/L — ABNORMAL LOW (ref 4.26–5.60)
RED CELL DISTRIBUTION WIDTH: 17.3 % — ABNORMAL HIGH (ref 12.2–15.2)
WBC ADJUSTED: 8.9 10*9/L (ref 3.6–11.2)

## 2021-03-09 LAB — SLIDE REVIEW

## 2021-03-09 LAB — MAGNESIUM: MAGNESIUM: 1.8 mg/dL (ref 1.6–2.6)

## 2021-03-09 LAB — PHOSPHORUS: PHOSPHORUS: 1.8 mg/dL — ABNORMAL LOW (ref 2.4–5.1)

## 2021-03-09 MED ADMIN — thiamine mononitrate (vit B1) tablet 200 mg: 200 mg | ORAL | @ 12:00:00 | Stop: 2021-03-11

## 2021-03-09 MED ADMIN — thiamine mononitrate (vit B1) tablet 200 mg: 200 mg | ORAL | @ 17:00:00 | Stop: 2021-03-11

## 2021-03-09 MED ADMIN — enoxaparin (LOVENOX) syringe 40 mg: 40 mg | SUBCUTANEOUS | @ 13:00:00

## 2021-03-09 MED ADMIN — thiamine mononitrate (vit B1) tablet 200 mg: 200 mg | ORAL | @ 01:00:00 | Stop: 2021-03-11

## 2021-03-09 MED ADMIN — potassium & sodium phosphates 250mg (PHOS-NAK/NEUTRA PHOS) packet 2 packet: 2 | ORAL | @ 23:00:00

## 2021-03-09 MED ADMIN — melatonin tablet 3 mg: 3 mg | ORAL | @ 23:00:00

## 2021-03-09 MED ADMIN — aspirin chewable tablet 81 mg: 81 mg | ORAL | @ 12:00:00

## 2021-03-09 MED ADMIN — oxyCODONE (ROXICODONE) immediate release tablet 5 mg: 5 mg | ORAL | @ 17:00:00 | Stop: 2021-03-14

## 2021-03-09 MED ADMIN — senna (SENOKOT) tablet 2 tablet: 2 | ORAL | @ 01:00:00

## 2021-03-09 MED ADMIN — magnesium oxide (MAG-OX) tablet 400 mg: 400 mg | ORAL | @ 01:00:00

## 2021-03-09 MED ADMIN — Relugolix (ORGOVYX) tablet 120 mg **PATIENT SUPPLIED**: 120 mg | ORAL | @ 13:00:00

## 2021-03-09 MED ADMIN — acetaminophen (TYLENOL) tablet 1,000 mg: 1000 mg | ORAL | @ 17:00:00

## 2021-03-09 MED ADMIN — pantoprazole (PROTONIX) EC tablet 40 mg: 40 mg | ORAL | @ 12:00:00

## 2021-03-09 MED ADMIN — acetaminophen (TYLENOL) tablet 1,000 mg: 1000 mg | ORAL | @ 10:00:00

## 2021-03-09 MED ADMIN — gadoterate meglumine (DOTAREM) Soln 12 mL: 12 mL | INTRAVENOUS | @ 14:00:00 | Stop: 2021-03-09

## 2021-03-09 MED ADMIN — acetaminophen (TYLENOL) tablet 1,000 mg: 1000 mg | ORAL | @ 01:00:00

## 2021-03-09 MED ADMIN — citalopram (CeleXA) tablet 20 mg: 20 mg | ORAL | @ 12:00:00

## 2021-03-09 MED ADMIN — ascorbic acid (vitamin C) (VITAMIN C) tablet 500 mg: 500 mg | ORAL | @ 12:00:00

## 2021-03-09 MED ADMIN — oxyCODONE (ROXICODONE) immediate release tablet 5 mg: 5 mg | ORAL | @ 10:00:00 | Stop: 2021-03-14

## 2021-03-09 MED ADMIN — oxyCODONE (ROXICODONE) immediate release tablet 5 mg: 5 mg | ORAL | @ 01:00:00 | Stop: 2021-03-14

## 2021-03-09 MED ADMIN — melatonin tablet 3 mg: 3 mg | ORAL | @ 01:00:00

## 2021-03-09 MED ADMIN — magnesium oxide (MAG-OX) tablet 400 mg: 400 mg | ORAL | @ 12:00:00

## 2021-03-09 MED ADMIN — oxyCODONE (ROXICODONE) immediate release tablet 5 mg: 5 mg | ORAL | @ 23:00:00 | Stop: 2021-03-14

## 2021-03-09 NOTE — Unmapped (Incomplete)
Pt alert and oriented x4, bedrest. Pt to MRI brain today, pt with recent history of hallucinations and episodes of confusion, reduction of narcotic pain meds and discontinued Marinol with noted improvement of confusion/hallucinations. BLE +2 or 3. Dim urine production CKD stage 3. Med for R rib pain PRN x2. R hip dressing and staples c/d/i. Pt has been afebrile with stable VS. Pt with active bowel sounds and sufficient urine output. No new skin breakdown this shift. No s/s infection this shift. Fall precautions and pt safety maintained. Will continue to monitor. ***   Problem: Skin Injury Risk Increased  Goal: Skin Health and Integrity  Intervention: Optimize Skin Protection  Recent Flowsheet Documentation  Taken 03/09/2021 1500 by Linna Darner, RN  Pressure Reduction Techniques: frequent weight shift encouraged  Taken 03/09/2021 0800 by Linna Darner, RN  Pressure Reduction Techniques: frequent weight shift encouraged  Pressure Reduction Devices: pressure-redistributing mattress utilized  Skin Protection:   adhesive use limited   incontinence pads utilized     Problem: Impaired Wound Healing  Goal: Optimal Wound Healing  Intervention: Promote Wound Healing  Recent Flowsheet Documentation  Taken 03/09/2021 1500 by Linna Darner, RN  Activity Management:   activity adjusted per tolerance   bedrest     Problem: Fall Injury Risk  Goal: Absence of Fall and Fall-Related Injury  Intervention: Promote Injury-Free Environment  Recent Flowsheet Documentation  Taken 03/09/2021 1400 by Linna Darner, RN  Safety Interventions:   aspiration precautions   bed alarm   family at bedside     Problem: Adult Inpatient Plan of Care  Goal: Absence of Hospital-Acquired Illness or Injury  Intervention: Identify and Manage Fall Risk  Recent Flowsheet Documentation  Taken 03/09/2021 1400 by Linna Darner, RN  Safety Interventions:   aspiration precautions   bed alarm   family at bedside  Intervention: Prevent Skin Injury  Recent Flowsheet Documentation  Taken 03/09/2021 0800 by Linna Darner, RN  Skin Protection:   adhesive use limited   incontinence pads utilized  Intervention: Prevent and Manage VTE (Venous Thromboembolism) Risk  Recent Flowsheet Documentation  Taken 03/09/2021 1500 by Linna Darner, RN  Activity Management:   activity adjusted per tolerance   bedrest  Intervention: Prevent Infection  Recent Flowsheet Documentation  Taken 03/09/2021 1400 by Linna Darner, RN  Infection Prevention:   cohorting utilized   hand hygiene promoted

## 2021-03-09 NOTE — Unmapped (Signed)
Thank you for your e-Consult.    SUMMARY (from chart):   Mr. Douglas Gray is an 75 y.o. male seen for a personal history of metastatic prostate cancer to bone. His family history is notable for his mother and sister both with breast cancer around age 20. 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 This is the same mutation that was detected in his somatic testing through [facility] at 45.8% variant allele frequency.      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 The BRCA2 gene is associated with autosomal dominant hereditary breast and ovarian cancer (HBOC) syndrome and autosomal recessive Fanconi anemia, type D1. This result is associated with prostate cancer predisposition. Males have a 7-8% risk of breast cancer and a 22-32% risk of prostate cancer. Both males and females have an increased risk of pancreatic cancer (4-9% lifetime risk) and melanoma (lifetime risks are not established). Biological relatives have a chance of being at risk for autosomal dominant BRCA2-related conditions and have a chance of being carriers for autosomal recessive BRCA2-related conditions. Those at risk should consider testing.      RECOMMENDATIONS:  1. Please place an Ambulatory Referral to [facility] for post-testing counseling and arrangement of any additional indicated surveillance and family member testing     I spent 11-15 minutes in medical consultative discussion and review of medical records, including a written report to the treating provider via electronic health record regarding the condition of this patient. This e-Consult did include an answerable clinical question and did recommend a clinic visit.    Ashlynn Messmore, CGC Ronalee Belts, MD    The recommendations provided in this eConsult are based on the clinical data available to me and are furnished without the benefit of a comprehensive in-person evaluation of the patient. Any new clinical issues or changes in patient status not available to me will need to be taken into account when assessing these recommendations. The ongoing management of this patient is the responsibility of the referring clinician. Please contact me if you have further questions.

## 2021-03-09 NOTE — Unmapped (Signed)
VSS, no events overnight. Oxy x2 for R hip/rib pain. Dressing to R hip changed. No new complaints per pt this shift. Bed alarm on, free of falls, call light in reach.     Problem: Skin Injury Risk Increased  Goal: Skin Health and Integrity  Outcome: Ongoing - Unchanged  Intervention: Optimize Skin Protection  Recent Flowsheet Documentation  Taken 03/08/2021 2110 by Elon Alas, RN  Pressure Reduction Techniques:   frequent weight shift encouraged   heels elevated off bed   weight shift assistance provided  Pressure Reduction Devices: pressure-redistributing mattress utilized     Problem: Impaired Wound Healing  Goal: Optimal Wound Healing  Outcome: Ongoing - Unchanged  Intervention: Promote Wound Healing  Recent Flowsheet Documentation  Taken 03/08/2021 2110 by Elon Alas, RN  Activity Management: activity adjusted per tolerance     Problem: Fall Injury Risk  Goal: Absence of Fall and Fall-Related Injury  Outcome: Ongoing - Unchanged  Intervention: Promote Injury-Free Environment  Recent Flowsheet Documentation  Taken 03/09/2021 0535 by Elon Alas, RN  Safety Interventions: bed alarm  Taken 03/09/2021 0320 by Elon Alas, RN  Safety Interventions: bed alarm  Taken 03/09/2021 0100 by Elon Alas, RN  Safety Interventions: bed alarm  Taken 03/08/2021 2310 by Elon Alas, RN  Safety Interventions: bed alarm  Taken 03/08/2021 2110 by Elon Alas, RN  Safety Interventions:   bed alarm   commode/urinal/bedpan at bedside   fall reduction program maintained   lighting adjusted for tasks/safety   low bed   nonskid shoes/slippers when out of bed     Problem: Adult Inpatient Plan of Care  Goal: Plan of Care Review  Outcome: Ongoing - Unchanged  Goal: Patient-Specific Goal (Individualized)  Outcome: Ongoing - Unchanged  Goal: Optimal Comfort and Wellbeing  Outcome: Ongoing - Unchanged  Goal: Readiness for Transition of Care  Outcome: Ongoing - Unchanged  Goal: Rounds/Family Conference  Outcome: Ongoing - Unchanged     Problem: Self-Care Deficit  Goal: Improved Ability to Complete Activities of Daily Living  Outcome: Ongoing - Unchanged

## 2021-03-10 LAB — CBC W/ AUTO DIFF
BASOPHILS ABSOLUTE COUNT: 0 10*9/L (ref 0.0–0.1)
BASOPHILS RELATIVE PERCENT: 0.3 %
EOSINOPHILS ABSOLUTE COUNT: 0.2 10*9/L (ref 0.0–0.5)
EOSINOPHILS RELATIVE PERCENT: 2 %
HEMATOCRIT: 32 % — ABNORMAL LOW (ref 39.0–48.0)
HEMOGLOBIN: 10.7 g/dL — ABNORMAL LOW (ref 12.9–16.5)
LYMPHOCYTES ABSOLUTE COUNT: 0.8 10*9/L — ABNORMAL LOW (ref 1.1–3.6)
LYMPHOCYTES RELATIVE PERCENT: 10.7 %
MEAN CORPUSCULAR HEMOGLOBIN CONC: 33.3 g/dL (ref 32.0–36.0)
MEAN CORPUSCULAR HEMOGLOBIN: 30.3 pg (ref 25.9–32.4)
MEAN CORPUSCULAR VOLUME: 91 fL (ref 77.6–95.7)
MEAN PLATELET VOLUME: 7.2 fL (ref 6.8–10.7)
MONOCYTES ABSOLUTE COUNT: 0.4 10*9/L (ref 0.3–0.8)
MONOCYTES RELATIVE PERCENT: 5.6 %
NEUTROPHILS ABSOLUTE COUNT: 6.4 10*9/L (ref 1.8–7.8)
NEUTROPHILS RELATIVE PERCENT: 81.4 %
PLATELET COUNT: 254 10*9/L (ref 150–450)
RED BLOOD CELL COUNT: 3.52 10*12/L — ABNORMAL LOW (ref 4.26–5.60)
RED CELL DISTRIBUTION WIDTH: 17.9 % — ABNORMAL HIGH (ref 12.2–15.2)
WBC ADJUSTED: 7.8 10*9/L (ref 3.6–11.2)

## 2021-03-10 LAB — BASIC METABOLIC PANEL
ANION GAP: 8 mmol/L (ref 5–14)
BLOOD UREA NITROGEN: 24 mg/dL — ABNORMAL HIGH (ref 9–23)
BUN / CREAT RATIO: 28
CALCIUM: 8 mg/dL — ABNORMAL LOW (ref 8.7–10.4)
CHLORIDE: 112 mmol/L — ABNORMAL HIGH (ref 98–107)
CO2: 22 mmol/L (ref 20.0–31.0)
CREATININE: 0.87 mg/dL
EGFR CKD-EPI AA MALE: >90 mL/min/{1.73_m2} (ref >=60)
EGFR CKD-EPI NON-AA MALE: 84 mL/min/{1.73_m2} (ref >=60)
GLUCOSE RANDOM: 70 mg/dL (ref 70–179)
POTASSIUM: 3.9 mmol/L (ref 3.4–4.8)
SODIUM: 142 mmol/L (ref 135–145)

## 2021-03-10 LAB — PHOSPHORUS: PHOSPHORUS: 2 mg/dL — ABNORMAL LOW (ref 2.4–5.1)

## 2021-03-10 LAB — MAGNESIUM: MAGNESIUM: 1.8 mg/dL (ref 1.6–2.6)

## 2021-03-10 MED ADMIN — aspirin chewable tablet 81 mg: 81 mg | ORAL | @ 14:00:00

## 2021-03-10 MED ADMIN — thiamine mononitrate (vit B1) tablet 200 mg: 200 mg | ORAL | @ 19:00:00 | Stop: 2021-03-11

## 2021-03-10 MED ADMIN — acetaminophen (TYLENOL) tablet 1,000 mg: 1000 mg | ORAL | @ 03:00:00

## 2021-03-10 MED ADMIN — melatonin tablet 3 mg: 3 mg | ORAL | @ 22:00:00

## 2021-03-10 MED ADMIN — senna (SENOKOT) tablet 2 tablet: 2 | ORAL | @ 01:00:00

## 2021-03-10 MED ADMIN — Relugolix (ORGOVYX) tablet 120 mg **PATIENT SUPPLIED**: 120 mg | ORAL | @ 14:00:00 | Stop: 2021-03-10

## 2021-03-10 MED ADMIN — zoledronic acid (ZOMETA) 4 mg in sodium chloride (NS) 0.9 % 100 mL IVPB: 4 mg | INTRAVENOUS | @ 22:00:00 | Stop: 2021-03-10

## 2021-03-10 MED ADMIN — oxyCODONE (ROXICODONE) immediate release tablet 5 mg: 5 mg | ORAL | @ 22:00:00 | Stop: 2021-03-14

## 2021-03-10 MED ADMIN — thiamine mononitrate (vit B1) tablet 200 mg: 200 mg | ORAL | @ 01:00:00 | Stop: 2021-03-11

## 2021-03-10 MED ADMIN — leuprolide (LUPRON) injection 7.5 mg: 7.5 mg | INTRAMUSCULAR | @ 22:00:00 | Stop: 2021-03-10

## 2021-03-10 MED ADMIN — potassium & sodium phosphates 250mg (PHOS-NAK/NEUTRA PHOS) packet 2 packet: 2 | ORAL | @ 10:00:00

## 2021-03-10 MED ADMIN — acetaminophen (TYLENOL) tablet 1,000 mg: 1000 mg | ORAL | @ 10:00:00

## 2021-03-10 MED ADMIN — magnesium oxide (MAG-OX) tablet 400 mg: 400 mg | ORAL | @ 01:00:00

## 2021-03-10 MED ADMIN — acetaminophen (TYLENOL) tablet 1,000 mg: 1000 mg | ORAL | @ 19:00:00

## 2021-03-10 MED ADMIN — oxyCODONE (ROXICODONE) immediate release tablet 5 mg: 5 mg | ORAL | @ 10:00:00 | Stop: 2021-03-14

## 2021-03-10 MED ADMIN — oxyCODONE (ROXICODONE) immediate release tablet 5 mg: 5 mg | ORAL | @ 03:00:00 | Stop: 2021-03-14

## 2021-03-10 MED ADMIN — thiamine mononitrate (vit B1) tablet 200 mg: 200 mg | ORAL | @ 14:00:00 | Stop: 2021-03-11

## 2021-03-10 MED ADMIN — oxyCODONE (ROXICODONE) immediate release tablet 5 mg: 5 mg | ORAL | @ 15:00:00 | Stop: 2021-03-14

## 2021-03-10 MED ADMIN — magnesium oxide (MAG-OX) tablet 400 mg: 400 mg | ORAL | @ 14:00:00

## 2021-03-10 MED ADMIN — citalopram (CeleXA) tablet 20 mg: 20 mg | ORAL | @ 14:00:00

## 2021-03-10 MED ADMIN — pantoprazole (PROTONIX) EC tablet 40 mg: 40 mg | ORAL | @ 14:00:00

## 2021-03-10 MED ADMIN — enoxaparin (LOVENOX) syringe 40 mg: 40 mg | SUBCUTANEOUS | @ 14:00:00

## 2021-03-10 MED ADMIN — ascorbic acid (vitamin C) (VITAMIN C) tablet 500 mg: 500 mg | ORAL | @ 14:00:00

## 2021-03-10 NOTE — Unmapped (Signed)
Oncology (MEDO) Progress Note    Assessment & Plan:   Douglas Gray is a 75 y.o. 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 *    Orthostatic Hypotension: BP WNL in last 24H after stopping midodrine; official diagnosis is still not clear; no obvious neurologic, cardiogenic, endocrine etiology. TTE w/ EF 50-55%, otherwise unremarkable; AM cort normal.  - compression hoses  - Fluid boluses PRN for orthostatic hypotension   -Plan for MRI/MRI to R/O intracranial cause    Right??pathologic greater trochanteric femur fracture with possible intertrochanteric extension - pathologic 6-10th rib fractures - stage IV HS prostate cancer: S/p fixation of R femur on 03/01/2021, no oxygen requirement from rib fractures.  Prostate cancer is Gleason 9, metastatic to bone with high volume disease. Now on ADT s/p degarelix 11/2020 and will changed to relugolix 01/17/2021 and enzalutamide 02/01/21  (per ENZAMET and [facility]ES). Enzalutamide on hold as possibly contributing to falls.  -As needed oxycodone  -will discuss restarting enzalutamide as outpatient  - Continue home relugolix (orgovyx) from home  -home Vit D/Calcium supplement    -Consider zoledronic acid  -MRI brain pending to evaluate for brain metastases  -Ongoing goals of care discussions about pursuing home hospice  ??  AKI on CKD IIIb: Resolved; creatinine now baseline    Delirium with hallucinations - ?Cognitive impairment.  Symptoms slowly improving, which points more towards Marinol as etiology.   Overall, feel that this could be  be 2/2 marinol, polypharmacy, or may be another chronic underlying process like cognitive impairment/dementia   - Delirium precautions  - Sleep holiday  - Decrease # of unnecessary lines  -MRI/MRA  - Could consider neuro consult if fails to improve  ??  Severe Protein-Calorie Malnutrition in the context of chronic illness (03/06/21 1051) - Hypoglycemia  Energy Intake: < or equal to 75% of estimated energy requirement for > or equal to 1 month  Interpretation of Wt. Loss: > 7.5% x 3 month  Fat Loss: Severe  Muscle Loss: Severe  Malnutrition Score: 4  Glucse typically 40s-60s. Wife denies any problem with appetite prior to admission to [facility] in March 2022. AM cortisol WNL.  Taking very little amounts of p.o., unable to maintain own nutritional requirements.  -nutrition consulted, appreciate recs.  - Stopped marinol 2/2 neuropsychiatric s/e. Cannot use remeron 2/2 prior AE, and megace contraindicated w/ age and cardiac dz  -D5LR  -Ongoing goals of care is discussion regarding feeding, prognosis, and possible home hospice    #Hypophosphatemia  -  Start supplemental K-Phos    Stable chronic problems  CAD s/p CABG: aspirin, rosuvastatin   Hx PUD, UGIB: protonix daily ??  S/p bioprosthetic AVR: cont ASA  LLE Edema: Stable. 2/2 saphenous vein harvested for CABG  Anxiety: Home Celexa 20 mg daily    Daily Checklist:  Diet: Regular Diet  DVT PPx: Lovenox 40mg  q24h  Electrolytes: No Repletion Needed  Code Status: Full Code  Dispo: SNF    Team Contact Information:   Primary Team: Oncology (MEDO)  Primary Resident: Naoma Diener, MD  Resident's Pager: [phone] (Oncology Intern - Cliffton Asters)    Interval History:   No acute events overnight.    Feels well this AM, pain controlled on PRNs alone. Discussion with patient and wife yesterday; team had discussion with patient and wife yesterday, discussing his rapid worsening of functional status after fall, and low likelihood of recovery, especially given inability to progress nutritional status.  They are interested in pursuing hospice, but are not ready to make a final decision.    Objective:   Temp:  [36.4 ??C-36.9 ??C] 36.4 ??C  Heart Rate:  [62-74] 74  Resp:  [16-20] 18  BP: (119-157)/(59-102) 145/68  SpO2:  [96 %-100 %] 99 %    Gen: Thin elderly gentleman in NAD, answers questions appropriately  Eyes: sclera anicteric, EOMI  HENT: atraumatic, dry mucous membranes  Heart: RRR, S1, S2, no 5/6 systolic murmur best auscultated at LUSB, radiating to carotids.  Lungs: CTAB, no crackles or wheezes, no use of accessory muscles  Abdomen: Normoactive bowel sounds, soft, NTND, no rebound/guarding  Extremities: no clubbing, cyanosis, or edema in the BLEs; dressing over R hip.  Neuro/Psych: Alert and oriented x3.  Passes attention testing today.  Labs/Studies: Labs and Studies from the last 24hrs per EMR and Reviewed

## 2021-03-10 NOTE — Unmapped (Signed)
VSS, no events overnight. PRN oxy x2. Bed alarm on, free of falls, call light in reach.     Problem: Skin Injury Risk Increased  Goal: Skin Health and Integrity  Outcome: Ongoing - Unchanged     Problem: Impaired Wound Healing  Goal: Optimal Wound Healing  Outcome: Ongoing - Unchanged  Intervention: Promote Wound Healing  Recent Flowsheet Documentation  Taken 03/09/2021 2045 by Elon Alas, RN  Activity Management: activity adjusted per tolerance     Problem: Fall Injury Risk  Goal: Absence of Fall and Fall-Related Injury  Outcome: Ongoing - Unchanged  Intervention: Promote Injury-Free Environment  Recent Flowsheet Documentation  Taken 03/10/2021 0610 by Elon Alas, RN  Safety Interventions: bed alarm  Taken 03/09/2021 2045 by Elon Alas, RN  Safety Interventions:   assistive device   bed alarm   commode/urinal/bedpan at bedside   fall reduction program maintained   lighting adjusted for tasks/safety   low bed   nonskid shoes/slippers when out of bed     Problem: Adult Inpatient Plan of Care  Goal: Plan of Care Review  Outcome: Ongoing - Unchanged  Goal: Patient-Specific Goal (Individualized)  Outcome: Ongoing - Unchanged  Goal: Optimal Comfort and Wellbeing  Outcome: Ongoing - Unchanged  Goal: Readiness for Transition of Care  Outcome: Ongoing - Unchanged  Goal: Rounds/Family Conference  Outcome: Ongoing - Unchanged     Problem: Self-Care Deficit  Goal: Improved Ability to Complete Activities of Daily Living  Outcome: Ongoing - Unchanged

## 2021-03-10 NOTE — Unmapped (Signed)
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  ??  CWOCN F/U assessment for sacral pressure injury and L arm skin tears. Wounds are healing well with current POC, continue with dressings and q2h L and R turns only.                03/10/21 1400   Wound 02/28/21 Pressure Injury Sacrum Mid Stage 3   Date First Assessed/Time First Assessed: 02/28/21 1330   Present on Hospital Admission: Yes  Primary Wound Type: Pressure Injury  Location: Sacrum  Wound Location Orientation: Mid  Staging: Stage 3  Medical Device Related Pressure Injury: No  Staged B...   Wound Image    Dressing Status      Changed   Wound Length (cm) 0.5 cm   Wound Width (cm) 0.5 cm   Wound Depth (cm) 0.1 cm   Wound Surface Area (cm^2) 0.25 cm^2   Wound Volume (cm^3) 0.025 cm^3   Wound Healing % 99   Wound Bed White   Odor None   Peri-wound Assessment      Hyperpigmentation;Blanchable erythema   Exudate Type      Serous Exudate Amnt      Scant   Treatments Cleansed/Irrigation   Dressing Silicone foam bordered dressing       Continence Status:   Continent    Moisture Associated Skin Damage:   - Incontinence-associated dermatitis (IAD)     Lab Results   Component Value Date    WBC 7.8 03/10/2021    HGB 10.7 (L) 03/10/2021    HCT 32.0 (L) 03/10/2021    A1C 5.2 11/24/2020    GLUF 73 12/05/2012    GLU 70 03/10/2021    POCGLU 97 02/28/2021    ALBUMIN 2.0 (L) 03/07/2021    PROT 4.5 (L) 03/07/2021     Risk Factors:   - Aging  - Extended Hospitalization  - Immobility  - Moisture  - Multiple co-morbidities  - Nutritional Deficit    Braden Scale Score: 17       Support Surface:   - Low Air Loss    Type Debridement Completed By WOCN:  N/A    Teaching:  -  Routine skin care  - Turning and repositioning  - Wound care    WOCN Recommendations:   - See nursing orders for wound care instructions.  - Contact WOCN with questions, concerns, or wound deterioration.    Topical Therapy/Interventions:   - Hydrophilic Gauze  - Silicone bordered foam    Recommended Consults:  - Not Applicable    WOCN Follow Up:  - Weekly    Plan of Care Discussed With:   - Patient  - Significant other  - RN primary    Supplies Ordered: No    Workup Time:   45 minutes     Ann (Desirea Mizrahi-Chia) Henderson Newcomer RN, BSN CWOCN [facility]  [phone]  Phone- [phone]

## 2021-03-11 LAB — CBC W/ AUTO DIFF
BASOPHILS ABSOLUTE COUNT: 0 10*9/L (ref 0.0–0.1)
BASOPHILS RELATIVE PERCENT: 0.6 %
EOSINOPHILS ABSOLUTE COUNT: 0.2 10*9/L (ref 0.0–0.5)
EOSINOPHILS RELATIVE PERCENT: 2.6 %
HEMATOCRIT: 31.2 % — ABNORMAL LOW (ref 39.0–48.0)
HEMOGLOBIN: 10.2 g/dL — ABNORMAL LOW (ref 12.9–16.5)
LYMPHOCYTES ABSOLUTE COUNT: 1.3 10*9/L (ref 1.1–3.6)
LYMPHOCYTES RELATIVE PERCENT: 15.9 %
MEAN CORPUSCULAR HEMOGLOBIN CONC: 32.9 g/dL (ref 32.0–36.0)
MEAN CORPUSCULAR HEMOGLOBIN: 29.7 pg (ref 25.9–32.4)
MEAN CORPUSCULAR VOLUME: 90.4 fL (ref 77.6–95.7)
MEAN PLATELET VOLUME: 6.9 fL (ref 6.8–10.7)
MONOCYTES ABSOLUTE COUNT: 0.5 10*9/L (ref 0.3–0.8)
MONOCYTES RELATIVE PERCENT: 6.4 %
NEUTROPHILS ABSOLUTE COUNT: 6 10*9/L (ref 1.8–7.8)
NEUTROPHILS RELATIVE PERCENT: 74.5 %
PLATELET COUNT: 260 10*9/L (ref 150–450)
RED BLOOD CELL COUNT: 3.45 10*12/L — ABNORMAL LOW (ref 4.26–5.60)
RED CELL DISTRIBUTION WIDTH: 18.4 % — ABNORMAL HIGH (ref 12.2–15.2)
WBC ADJUSTED: 8.1 10*9/L (ref 3.6–11.2)

## 2021-03-11 LAB — BASIC METABOLIC PANEL
ANION GAP: 4 mmol/L — ABNORMAL LOW (ref 5–14)
BLOOD UREA NITROGEN: 25 mg/dL — ABNORMAL HIGH (ref 9–23)
BUN / CREAT RATIO: 23
CALCIUM: 8 mg/dL — ABNORMAL LOW (ref 8.7–10.4)
CHLORIDE: 113 mmol/L — ABNORMAL HIGH (ref 98–107)
CO2: 25 mmol/L (ref 20.0–31.0)
CREATININE: 1.11 mg/dL — ABNORMAL HIGH
EGFR CKD-EPI AA MALE: 75 mL/min/{1.73_m2} (ref >=60)
EGFR CKD-EPI NON-AA MALE: 65 mL/min/{1.73_m2} (ref >=60)
GLUCOSE RANDOM: 85 mg/dL (ref 70–179)
POTASSIUM: 4.5 mmol/L (ref 3.4–4.8)
SODIUM: 142 mmol/L (ref 135–145)

## 2021-03-11 LAB — PHOSPHORUS: PHOSPHORUS: 1.9 mg/dL — ABNORMAL LOW (ref 2.4–5.1)

## 2021-03-11 LAB — MAGNESIUM: MAGNESIUM: 1.8 mg/dL (ref 1.6–2.6)

## 2021-03-11 MED ORDER — OXYCODONE 5 MG TABLET
ORAL_TABLET | ORAL | 0 refills | 3.00000 days | Status: CP | PRN
Start: 2021-03-11 — End: ?
  Filled 2021-03-11: qty 4, 1d supply, fill #0

## 2021-03-11 MED ORDER — PROCHLORPERAZINE MALEATE 10 MG TABLET
ORAL_TABLET | Freq: Four times a day (QID) | ORAL | 0 refills | 2 days | Status: CP | PRN
Start: 2021-03-11 — End: ?
  Filled 2021-03-11: qty 6, 2d supply, fill #0

## 2021-03-11 MED ORDER — ACETAMINOPHEN 650 MG RECTAL SUPPOSITORY
Freq: Four times a day (QID) | RECTAL | 0 refills | 1 days | Status: CP | PRN
Start: 2021-03-11 — End: ?
  Filled 2021-03-11: qty 15, 3d supply, fill #0

## 2021-03-11 MED ORDER — LORAZEPAM 0.5 MG TABLET
ORAL_TABLET | Freq: Four times a day (QID) | ORAL | 0 refills | 3 days | Status: CP | PRN
Start: 2021-03-11 — End: ?
  Filled 2021-03-11: qty 10, 3d supply, fill #0

## 2021-03-11 MED ORDER — SENNOSIDES 8.6 MG TABLET
ORAL_TABLET | Freq: Every day | ORAL | 0 refills | 30 days
Start: 2021-03-11 — End: 2021-04-10

## 2021-03-11 MED ORDER — CITALOPRAM 20 MG TABLET
ORAL_TABLET | Freq: Every day | ORAL | 0 refills | 30.00000 days
Start: 2021-03-11 — End: 2021-04-10

## 2021-03-11 MED ORDER — HALOPERIDOL LACTATE 2 MG/ML ORAL CONCENTRATE
Freq: Four times a day (QID) | ORAL | 0 refills | 8 days | Status: CP | PRN
Start: 2021-03-11 — End: ?
  Filled 2021-03-11: qty 15, 8d supply, fill #0

## 2021-03-11 MED ORDER — BISACODYL 10 MG RECTAL SUPPOSITORY
Freq: Every day | RECTAL | 0 refills | 2 days | Status: CP | PRN
Start: 2021-03-11 — End: 2021-04-10
  Filled 2021-03-11: qty 2, 2d supply, fill #0

## 2021-03-11 MED ORDER — HYOSCYAMINE 0.125 MG SUBLINGUAL TABLET
ORAL_TABLET | Freq: Four times a day (QID) | SUBLINGUAL | 0 refills | 3.00000 days | Status: CP | PRN
Start: 2021-03-11 — End: ?
  Filled 2021-03-11: qty 12, 3d supply, fill #0

## 2021-03-11 MED ADMIN — senna (SENOKOT) tablet 2 tablet: 2 | ORAL

## 2021-03-11 MED ADMIN — citalopram (CeleXA) tablet 20 mg: 20 mg | ORAL | @ 13:00:00 | Stop: 2021-03-11

## 2021-03-11 MED ADMIN — pantoprazole (PROTONIX) EC tablet 40 mg: 40 mg | ORAL | @ 13:00:00 | Stop: 2021-03-11

## 2021-03-11 MED ADMIN — oxyCODONE (ROXICODONE) immediate release tablet 5 mg: 5 mg | ORAL | @ 12:00:00 | Stop: 2021-03-11

## 2021-03-11 MED ADMIN — oxyCODONE (ROXICODONE) immediate release tablet 5 mg: 5 mg | ORAL | @ 07:00:00 | Stop: 2021-03-11

## 2021-03-11 MED ADMIN — oxyCODONE (ROXICODONE) immediate release tablet 5 mg: 5 mg | ORAL | @ 02:00:00 | Stop: 2021-03-14

## 2021-03-11 MED ADMIN — magnesium oxide (MAG-OX) tablet 400 mg: 400 mg | ORAL

## 2021-03-11 MED ADMIN — thiamine mononitrate (vit B1) tablet 200 mg: 200 mg | ORAL | Stop: 2021-03-11

## 2021-03-11 MED ADMIN — ascorbic acid (vitamin C) (VITAMIN C) tablet 500 mg: 500 mg | ORAL | @ 13:00:00 | Stop: 2021-03-11

## 2021-03-11 MED ADMIN — thiamine mononitrate (vit B1) tablet 200 mg: 200 mg | ORAL | @ 13:00:00 | Stop: 2021-03-11

## 2021-03-11 MED ADMIN — acetaminophen (TYLENOL) tablet 1,000 mg: 1000 mg | ORAL | @ 02:00:00

## 2021-03-11 MED ADMIN — aspirin chewable tablet 81 mg: 81 mg | ORAL | @ 13:00:00 | Stop: 2021-03-11

## 2021-03-11 MED ADMIN — acetaminophen (TYLENOL) tablet 1,000 mg: 1000 mg | ORAL | @ 12:00:00 | Stop: 2021-03-11

## 2021-03-11 MED ADMIN — magnesium oxide (MAG-OX) tablet 400 mg: 400 mg | ORAL | @ 13:00:00 | Stop: 2021-03-11

## 2021-03-11 MED ADMIN — enoxaparin (LOVENOX) syringe 40 mg: 40 mg | SUBCUTANEOUS | @ 13:00:00 | Stop: 2021-03-11

## 2021-03-11 NOTE — Unmapped (Signed)
Orthopaedics Progress Note    Assessment and Plan:  75 y.o. male s/p CMN fixation of right pathologic intertrochanteric femur fracture 02/28/21    ??? Okay from orthopaedic perspective to consider palliative radiation to proximal femur and pelvic metastasis  ??? DVT Prophylaxis: Lovenox 40 mg daily, SCDs, ambulation.  ??? Weightbearing as tolerated.  ??? Multimodal Pain control. Transition off narcotic and IV medications as tolerated.  ??? Antibiotics: surgical prophylaxis indicated for 23 hrs postoperatively.  ??? Dressing changed 4/8; okay to change by RN as needed. Okay for incision to get wet.  ??? Therapy: PT/OT  ??? Disposition: Per primary. Okay to discharge from orthopaedic standpoint. We will arrange follow-up after discharge.     Subjective  Pain improving in operative extremity. No wound issues. Reports that he has been able to get up w/ PT.     Objective  Vitals: Blood pressure 139/60, pulse 68, temperature 36.4 ??C, temperature source Oral, resp. rate 16, weight 63.5 kg (139 lb 15.9 oz), SpO2 98 %.  General Appearance: Awake and alert   MSK exam:(RLE):    Cardiovascular:   Palpable DP pulse.    Neuro/Compartments:   RLE: Incisions c/d/i. Patient fires GS, TA, EHL. Sensation grossly intact.  Compartment soft compressible.  No pain with passive stretch.  DP pulse 2+.        Contact  Please contact Clement Sayres, APP [phone]) 6am-3pm Tuesday-Friday  On nights (6pm-6am), weekends, and holidays, please page the Consult pager.  On Mondays please contact the person who leaves daily progress notes.

## 2021-03-11 NOTE — Unmapped (Cosign Needed)
Oncology ([facility]) Progress Note    Assessment & Plan:   Douglas Gray is a 75 y.o. 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 Now electing for a comfort based approach and planning for home hospice discharge tomorrow.    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 *    Orthostatic Hypotension: BP WNL in last 24H after stopping midodrine; official diagnosis is still not clear; no obvious neurologic, cardiogenic, endocrine etiology. TTE w/ EF 50-55%, otherwise unremarkable; AM cort normal. MRI brain shows extracranial mets but no intracranial etiology for orthostasis.  - compression hoses  - Fluid boluses PRN for orthostatic hypotension     Right??pathologic greater trochanteric femur fracture with possible intertrochanteric extension - pathologic 6-10th rib fractures - stage IV HS prostate cancer: S/p fixation of R femur on 03/01/2021, no oxygen requirement from rib fractures.  Prostate cancer is Gleason 9, metastatic to bone with high volume disease. Now on ADT s/p degarelix 11/2020 and will changed to relugolix 01/17/2021 and enzalutamide 02/01/21  (per ENZAMET and ARCHES). Enzalutamide on hold as possibly contributing to falls.  -As needed oxycodone  -will discuss restarting enzalutamide as outpatient  - Stoppping relugolix  -home Vit D/Calcium supplement    -Zoledronic acid dose today and leupron for palliative purposes  -Anticipating dispo to home hospice tomorrow  ??  AKI on CKD IIIb: Resolved; creatinine now baseline    Delirium with hallucinations - ?Cognitive impairment.  Symptoms slowly improving, which points more towards Marinol as etiology.   Overall, feel that this could be  be 2/2 marinol, polypharmacy, or may be another chronic underlying process like cognitive impairment/dementia vs multifactorial including metabolic encephalopathy  - Delirium precautions  - Sleep holiday   - Decrease # of unnecessary lines  ??  Severe Protein-Calorie Malnutrition in the context of chronic illness (03/06/21 1051) - Hypoglycemia  Energy Intake: < or equal to 75% of estimated energy requirement for > or equal to 1 month  Interpretation of Wt. Loss: > 7.5% x 3 month  Fat Loss: Severe  Muscle Loss: Severe  Malnutrition Score: 4  Glucse typically 40s-60s. Wife denies any problem with appetite prior to admission to [facility] in March 2022. AM cortisol WNL.  Taking very little amounts of p.o., unable to maintain own nutritional requirements.  -nutrition consulted, appreciate recs.  - Stopped marinol 2/2 neuropsychiatric s/e. Cannot use remeron 2/2 prior AE, and megace contraindicated w/ age and cardiac dz  -D5LR  -Ongoing goals of care is discussion regarding feeding, prognosis, and possible home hospice    #Hypophosphatemia  -  Start supplemental K-Phos    Stable chronic problems  CAD s/p CABG: aspirin, rosuvastatin   Hx PUD, UGIB: protonix daily ??  S/p bioprosthetic AVR: cont ASA  LLE Edema: Stable. 2/2 saphenous vein harvested for CABG  Anxiety: Home Celexa 20 mg daily    Daily Checklist:  Diet: Regular Diet  DVT PPx: Lovenox 40mg  q24h  Electrolytes: No Repletion Needed  Code Status: Full Code  Dispo: Home with Hospice, planning for tomorrow    Team Contact Information:   Primary Team: Oncology ([facility])  Primary Resident: Carlynn Herald, MD  Resident's Pager: [phone] (Oncology Intern - Cliffton Asters)    Interval History:   No acute events overnight.    Still continuing with minimal PO intake. No acute events. Has decided to pursue home hospice tomorrow. Still needs to make a decision about code status.    Objective:   Temp:  [36.4 ??C-36.7 ??C] 36.4 ??C  Heart Rate:  [61-72] 72  Resp:  [16-18] 16  BP: (133-167)/(62-78) 133/71  SpO2:  [95 %-100 %] 95 %    Gen: Thin elderly gentleman in NAD, answers questions appropriately  Eyes: sclera anicteric, EOMI  HENT: atraumatic, dry mucous membranes  Heart: RRR, S1, S2, no 5/6 systolic murmur best auscultated at LUSB, radiating to carotids.  Lungs: CTAB, no crackles or wheezes, no use of accessory muscles  Abdomen: Normoactive bowel sounds, soft, NTND, no rebound/guarding  Extremities: no clubbing, cyanosis, or edema in the BLEs; dressing over R hip.  Neuro/Psych: Alert and oriented x3.  Passes attention testing today.    Labs/Studies: Labs and Studies from the last 24hrs per EMR and Reviewed

## 2021-03-11 NOTE — Unmapped (Signed)
No acute changes this shift.   Problem: Skin Injury Risk Increased  Goal: Skin Health and Integrity  Outcome: Progressing     Problem: Impaired Wound Healing  Goal: Optimal Wound Healing  Outcome: Progressing     Problem: Fall Injury Risk  Goal: Absence of Fall and Fall-Related Injury  Outcome: Progressing  Intervention: Promote Injury-Free Environment  Recent Flowsheet Documentation  Taken 03/10/2021 0745 by Lynda Rainwater, RN  Safety Interventions:   fall reduction program maintained   low bed   nonskid shoes/slippers when out of bed   bed alarm     Problem: Adult Inpatient Plan of Care  Goal: Plan of Care Review  Outcome: Progressing  Goal: Patient-Specific Goal (Individualized)  Outcome: Progressing  Goal: Absence of Hospital-Acquired Illness or Injury  Intervention: Identify and Manage Fall Risk  Recent Flowsheet Documentation  Taken 03/10/2021 0745 by Lynda Rainwater, RN  Safety Interventions:   fall reduction program maintained   low bed   nonskid shoes/slippers when out of bed   bed alarm  Goal: Optimal Comfort and Wellbeing  Outcome: Progressing  Goal: Readiness for Transition of Care  Outcome: Progressing  Goal: Rounds/Family Conference  Outcome: Progressing     Problem: Self-Care Deficit  Goal: Improved Ability to Complete Activities of Daily Living  Outcome: Progressing

## 2021-03-11 NOTE — Unmapped (Signed)
Physician Discharge Summary [facility]  [address] [facility]  [address]  [place] [address]  Dept: [phone]  Loc: [phone]     Identifying Information:   Douglas Gray  29-May-1946  [id number]    Primary Care Physician: Ferne Coe     Referring Physician: Referred Self     Code Status: Full Code    Admit Date: 02/27/2021    Discharge Date: 03/11/2021     Discharge To: Home Hospice    Discharge Service: [facility] - Oncology Floor Team (MEDO)     Discharge Attending Physician: Norm Parcel, MD    Discharge Diagnoses:  Principal Problem:    Pathologic fracture of neck of right femur (CMS-HCC)  Active Problems:    Nonrheumatic mitral valve regurgitation    Hx of CABG    Status post aortic valve replacement with bioprosthetic valve    PVD (peripheral vascular disease) (CMS-HCC)    Stage 3 chronic kidney disease (CMS-HCC)    Hx of falling    Bone metastases (CMS-HCC)    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 *      Outpatient Provider Follow Up Issues:   Supportive Care Recommendations:  We recommend based on the patient???s underlying diagnosis and treatment history the following supportive care:    1. Antimicrobial prophylaxis:  None    2. Blood product support:  Leukoreduced blood products are required.  Irradiated blood products are preferred, but in case of urgent transfusion needs non-irradiated blood products may be used:     -  No special recommendations.  Follow internal standard practices.      3. Hematopoietic growth factor support: none    Hospital Course:   Items for outpatient follow up:    [ ]  Discharging home on hospice with comfort meds  [ ]  Have stopped Marinol, meclizine, Oxycontin, and Xtampza 2/2 cognitive s/e  [ ]  I discussed BRACA-2 results with patient and wife prior to discharge and notified them that our genetics team would be in contact.     Douglas Gray is a 75 y.o. 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 Hospital course by problem below.    #Stage IV HSPC - Pathologic Right Rib Fractures I Small Pneumothorax - Right pathologic greater trochanteric femur fracture with possible intertrochanteric extension: PSA 1500, Gleason 9, metastatic to bone with high volume disease. Now on ADT s/p degarelix 11/2020 and was changed to relugolix 01/17/2021 and enzalutamide 02/01/21  (per ENZAMET and ARCHES). History of progressive R hip pain over the last 6-7 months. Found to have a R femur fracture on admission, reportedly new from 3/25 R femur XR completed at [facility]. S/p fixation on 4/6. Imaging on admission includes additional acute right rib displaced fractures now including at least the 6-10th posterior ribs with small associated pneumothorax and small volume pleural effusion the right hemithorax. No oxygen requirement. Discharged on PRN oxy alone given confusion with many other medications.  After extensive discussions with patient and his wife, they elected for discharge home with home hospice, and thus his Relugolix and Enzalutamide will be stopped.     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 I discussed these results with the patient and his wife prior to discharge them and notified them that our genetics team would be in contact.     #Altered mental status - delirium - visual and auditory hallucinations - Polypharmacy:  On admission, was disoriented and confused, and did report auditory and visual hallucinations.  This improved with discontinuation of Marinol, standing oxycodone, and meclizine.  Overall we feel this was 2/2 delirium and polypharmacy.  Thus, we have discontinued his standing oxycodone, meclizine, and Marinol on discharge.  Pain control will be achieved with as needed oxycodone until hospice care begins.    #Orthostatic hypotension: Blood pressure supine stabilized, so midodrine was discontinued.  Prior work-up outpatient at [facility] neurology and outside cardiology demonstrated no neurologic or cardiac etiology.  Did obtain MRI/MRA while inpatient which showed only bony calvarial metastases without intracranial lesions.    # Chronic/minor medical Problems  AKI on CKD: Creatinine peaked at 2.46, returned to baseline by discharge  Severe Protein-Calorie Malnutrition in the context of chronic illness (03/01/21 0955)  Energy Intake: < or equal to 75% of estimated energy requirement for > or equal to 1 month  Fat Loss: Severe  Muscle Loss: Severe  Malnutrition Score: 3  Was started on Marinol during admission at [facility] without improvement. Stopped 2/2 cognitive side effects here. that admission however without improvement. Stopped 4/11 due to cognitive side effects. Was allergic to Mirtazapine and Megace contraindicated 2/2 cardiac hx. Ultimately opted for DC with hospice as above.    Anxiety:   Changed Celexa to 20mg  daily from QOD.   LLE Edema: stable, 2/2 saphenous vein harvesting from prior CABG. Dc'ed lasix in s/o hypotension.    CAD s/p CABG: aspirin, rosuvastatin   S/p bioprosthetic AVR: home ASA  Hx PUD, UGIB: protonix daily          Procedures:  Cephallomedullary nail fixation of right closed intertrochanteric femur fracture (CPT [phone])  No admission procedures for hospital encounter.  ______________________________________________________________________  Discharge Medications:     Your Medication List      STOP taking these medications    ascorbic acid (vitamin C) 500 MG tablet  Commonly known as: VITAMIN C     aspirin 81 MG chewable tablet     bisacodyL 5 mg EC tablet  Commonly known as: DULCOLAX  Replaced by: bisacodyL 10 mg suppository  You also have another medication with the same name that you need to continue taking as instructed.     docusate sodium 100 MG capsule  Commonly known as: COLACE     dronabinoL 5 MG capsule  Commonly known as: MARINOL     enzalutamide 80 mg tablet  Commonly known as: XTANDI     furosemide 40 MG tablet  Commonly known as: LASIX     meclizine 50 MG tablet  Commonly known as: ANTIVERT     midodrine 5 MG tablet  Commonly known as: PROAMATINE     oxyCODONE 10 mg Tr12 12 hr crush resistant ER/CR tablet  Commonly known as: OxyCONTIN  Replaced by: oxyCODONE 5 MG immediate release tablet  You also have another medication with the same name that you need to continue taking as instructed.     polysaccharide iron complex 150 mg iron capsule  Commonly known as: NIFEREX     relugolix 120 mg tablet  Commonly known as: ORGOVYX     rosuvastatin 5 MG tablet  Commonly known as: CRESTOR     XTAMPZA ER  13.5 mg Cspt  Generic drug: oxyCODONE myristate        START taking these medications    haloperidol 2 mg/mL solution  Commonly known as: HALDOL  Take 0.5 mL (1 mg total) by mouth every six (6) hours as needed (for agitation).     hyoscyamine 0.125 mg SL tablet  Commonly known as: LEVSIN/SL  Place 1 tablet (0.125 mg total) under the tongue every six (6) hours as needed for cramping (or secretions).     LORazepam 0.5 MG tablet  Commonly known as: ATIVAN  Take 1 tablet (0.5 mg total) by mouth every six (6) hours as needed for other (for anxiety or agitation).     prochlorperazine 10 MG tablet  Commonly known as: COMPAZINE  Take 1 tablet (10 mg total) by mouth every six (6) hours as needed for nausea.        CHANGE how you take these medications    acetaminophen 500 MG tablet  Commonly known as: TYLENOL  Take 1,000 mg by mouth every eight (8) hours.  What changed: Another medication with the same name was added. Make sure you understand how and when to take each.     acetaminophen 650 MG suppository  Commonly known as: TYLENOL  Unwrap and Insert 1 suppository (650 mg total) into the rectum every six (6) hours as needed for fever.  What changed: You were already taking a medication with the same name, and this prescription was added. Make sure you understand how and when to take each.     citalopram 20 MG tablet  Commonly known as: CeleXA  Take 1 tablet (20 mg total) by mouth daily.  What changed: when to take this     DULCOLAX (BISACODYL) 10 mg suppository  Generic drug: bisacodyL  Insert 10 mg into the rectum daily as needed.  What changed:   ?? Another medication with the same name was added. Make sure you understand how and when to take each.  ?? Another medication with the same name was removed. Continue taking this medication, and follow the directions you see here.     bisacodyL 10 mg suppository  Commonly known as: DULCOLAX  Unwrap and Insert 1 suppository (10 mg total) into the rectum daily as needed (constipation).  What changed: You were already taking a medication with the same name, and this prescription was added. Make sure you understand how and when to take each.  Replaces: bisacodyL 5 mg EC tablet     oxyCODONE 5 MG immediate release tablet  Commonly known as: ROXICODONE  Take 1 to 2 tablets by mouth every four hours as needed for pain.  What changed:   ?? how much to take  ?? Another medication with the same name was removed. Continue taking this medication, and follow the directions you see here.     oxyCODONE 5 MG immediate release tablet  Commonly known as: ROXICODONE  Take 1 tablet (5 mg total) by mouth every four (4) hours as needed for pain (shortness of breath) for up to 15 doses.  What changed: You were already taking a medication with the same name, and this prescription was added. Make sure you understand how and when to take each.  Replaces: oxyCODONE 10 mg Tr12 12 hr crush resistant ER/CR tablet     senna 8.6 mg tablet  Commonly known as: SENOKOT  Take 2 tablets by mouth daily.  What changed: how much to take  CONTINUE taking these medications    lidocaine 5 % patch  Commonly known as: LIDODERM  Place 1 patch on the skin daily. Apply to affected area for 12 hours only each day (then remove patch)     pantoprazole 40 MG tablet  Commonly known as: PROTONIX  Take 1 tablet (40 mg total) by mouth Two (2) times a day.     polyethylene glycol 17 gram packet  Commonly known as: MIRALAX  Take 17 g by mouth Two (2) times a day.     traZODone 50 MG tablet  Commonly known as: DESYREL  Take 150 mg by mouth nightly.     VITAMIN D2-1,250 mcg (50,000 unit) 1,250 mcg (50,000 unit) capsule  Generic drug: ergocalciferol-1,250 mcg (50,000 unit)  Take 1,250 mcg by mouth daily.            Allergies:  Metformin, Mirtazapine, Janumet [sitagliptin-metformin], and Indomethacin  ______________________________________________________________________  Pending Test Results (if blank, then none):      Most Recent Labs:  All lab results last 24 hours -   Recent Results (from the past 24 hour(s))   Magnesium Level    Collection Time: 03/11/21  5:45 AM   Result Value Ref Range    Magnesium 1.8 1.6 - 2.6 mg/dL   Phosphorus Level    Collection Time: 03/11/21  5:45 AM   Result Value Ref Range    Phosphorus 1.9 (L) 2.4 - 5.1 mg/dL   Basic Metabolic Panel    Collection Time: 03/11/21  5:45 AM   Result Value Ref Range    Sodium 142 135 - 145 mmol/L    Potassium 4.5 3.4 - 4.8 mmol/L    Chloride 113 (H) 98 - 107 mmol/L    CO2 25.0 20.0 - 31.0 mmol/L    Anion Gap 4 (L) 5 - 14 mmol/L    BUN 25 (H) 9 - 23 mg/dL    Creatinine 5.62 (H) 0.60 - 1.10 mg/dL    BUN/Creatinine Ratio 23     EGFR CKD-EPI Non-African American, Male 59 >=60 mL/min/1.47m2    EGFR CKD-EPI African American, Male 93 >=60 mL/min/1.19m2    Glucose 85 70 - 179 mg/dL    Calcium 8.0 (L) 8.7 - 10.4 mg/dL   CBC w/ Differential    Collection Time: 03/11/21  5:45 AM   Result Value Ref Range    WBC 8.1 3.6 - 11.2 10*9/L    RBC 3.45 (L) 4.26 - 5.60 10*12/L    HGB 10.2 (L) 12.9 - 16.5 g/dL    HCT 13.0 (L) 86.5 - 48.0 %    MCV 90.4 77.6 - 95.7 fL    MCH 29.7 25.9 - 32.4 pg    MCHC 32.9 32.0 - 36.0 g/dL    RDW 78.4 (H) 69.6 - 15.2 %    MPV 6.9 6.8 - 10.7 fL    Platelet 260 150 - 450 10*9/L    Neutrophils % 74.5 %    Lymphocytes % 15.9 %    Monocytes % 6.4 %    Eosinophils % 2.6 %    Basophils % 0.6 %    Absolute Neutrophils 6.0 1.8 - 7.8 10*9/L    Absolute Lymphocytes 1.3 1.1 - 3.6 10*9/L    Absolute Monocytes 0.5 0.3 - 0.8 10*9/L    Absolute Eosinophils 0.2 0.0 - 0.5 10*9/L    Absolute Basophils 0.0 0.0 - 0.1 10*9/L    Anisocytosis Slight (A) Not Present       Relevant Studies/Radiology (if  blank, then none):  MRI Brain W Wo Contrast    Result Date: 03/09/2021  EXAM: Magnetic resonance imaging, brain without and with contrast material.     DATE: 03/09/2021 10:49 AM ACCESSION: [id number] UN DICTATED: 03/09/2021 10:48 AM INTERPRETATION LOCATION: Main Campus     CLINICAL INDICATION: 75 years old Male with Dizziness, hx prostate cancer      COMPARISON: None     TECHNIQUE: Multiplanar, multisequence MR imaging of the brain was performed without and with I.V. contrast.     FINDINGS:     There are scattered and confluent  foci of signal abnormality within the periventricular and deep white matter.  These are nonspecific but commonly seen with small vessel ischemic changes. Frontoparietal predominant cerebral atrophy. Mild midbrain atrophy. There are punctate microhemorrhage is noted in the left cerebellum and right frontal lobe. There is faint hemosiderin staining of the right precentral gyrus versus a vessel. There are a couple of scattered punctate chronic microhemorrhages, nonspecific (for example right corona radiata 11:48 and left cerebellum 11:21). There is no midline shift. No extra-axial fluid collection. No evidence of acute intracranial hemorrhage.  No diffusion weighted signal abnormality to suggest acute infarct. Suggestion of borderzone pattern of mildly decreased cerebral perfusion based on ASL, though imaging is partially limited due to artifact in the right face, likely from dental hardware. No mass. There is a punctate foci of enhancement in the right petrous apex, right occipital condyle, multiple T1 hypointense mildly enhancing foci of the calvarium, and patchy enhancement of the partially imaged cervical spine vertebrae (20:90). No discrete cortical breakthrough.     Right mastoid/petrous apex effusion. Status post bilateral lens replacement. Mild scattered paranasal sinus mucosal thickening.             Multifocal enhancing osseous lesions concerning for metastatic disease in the setting of prostate cancer.     No evidence of intracranial metastatic disease, or acute intracranial abnormality.    ______________________________________________________________________    Activity Instructions     Activity as tolerated                Other Instructions     Call MD for:  difficulty breathing, headache or visual disturbances      Call MD for:  persistent nausea or vomiting      Call MD for:  severe uncontrolled pain      Call MD for:  temperature >38.5 Celsius      Discharge instructions      [facility] Orthopaedics  Musculoskeletal Oncology  Edilia Bo, MD    Showering: you may begin showering 3 days after your surgery. Pat your incision dry with a towel afterward. Do not take a bath or otherwise submerge your incision for at least 2 weeks.    Activity: getting up and moving around is generally good but do not exercise to exhaustion or so that you sweat into your incision.  Weight bearing status: weight bearing as tolerated right lower extremity    Dressing/Incision: please remove your bandage 72 hours after your surgery. After the dressing is off it is OK to leave the incision open to the air. If you would like to prevent soiling of clothing or to prevent your staples or sutures from being irritated, please cover with a clean dry gauze dressing.    Things that are worrisome for which you should contact us:   1. Fevers greater than 101.5   2. Redness spreading more than 1/4 inch from your incision   3. Drainage  from the incision    -Please have SNF remove staples/sutures around 03/14/21 (2 weeks postop).   -DVT prophylaxis: Recommend lovenox 40 mg daily for 4 weeks unless other prophylaxis preferred by medical team.   -Follow up 6 weeks postop, ortho to arrange.   -Questions for orthopaedics after discharge: [phone], ask to be connected to the nurse for Dr. Darral Dash.    Return appointments:   You should have been given an appt 6 weeks after your surgery for routine follow-up.    Contact us: Appointments: [phone] [facility])   Nurse Inetta Fermo Wallace:[phone]   Dr Judeth Horn Office: [phone]  Resident on call : [phone]    Discharge instructions      I certify that based on my evaluation of this patient, this patient requires BLS transportation services and that other forms of transport are contraindicated.  Please refer to care management transitions note for transportation details.    Discharge instructions      It was a pleasure taking care of you!    You were admitted to [facility] for leg and rib fractures which were caused by your cancer spreading to your hip and ribs. We also found that your blood pressure was low while standing and that you had dizziness. You were also somewhat confused when you arrived in the hospital, and we think this was caused by some of the medications you were taking. After many conversations with our team, you and your family decided that you would like to leave the hospital with hospice which we have arranged for you. Your hospice agency will take over your care from here.     These are the medicines we recommend no longer taking because they were causing confusion  - Marinol  - OxyContin (ok to keep taking as needed oxycodone)  - Xtampza (ok to keep taking as needed oxycodone)  - Meclizine   - Enzalutamide    I have sent new prescriptions for medicines for pain, anxiety, nausea, and constipation for you to have until the hospice team takes over your care.    Please make sure you have a functioning thermometer at home.?? If you are feeling poorly, especially if you have chills, shaking, muscle aches or lightheadedness, measure your temperature. If it is more than 100.5 Farenheit, call your hospice agency or the [facility] nurse triage line during daytime hours (Monday through Friday 8AM - 5PM: [phone]) or on nights and weekends, the on-call doctor by calling the hospital operator [phone]) and asking for the on-call adult oncologist. Alternatively, since fever after chemotherapy may be a medical emergency, you may proceed directly to your local emergency room. Inform your provider that you recently received chemotherapy. You may have blood drawn for blood cultures and receive IV antibiotics.    Following discharge from the hospital, if you develop or notice worsening of any symptoms such as nausea, vomiting, chest pain, shortness of breath, fevers, or chills, please return to the emergency department.??     If you develop these symptoms, or if you have trouble obtaining any of your medications you may call the [facility] Communication Center to speak with the triage team at [phone] if Monday through Friday 8am-5pm or call [phone] after hours.      For appointments & questions Monday through Friday 8 AM - 5 PM   please call [phone] or toll free [phone].    On Nights, Weekends and Holidays  Call [phone] and ask for the oncologist on call.    [place]  [facility]  [address]  [place], [place] [address]  [email or web address]          Follow Up instructions and Outpatient Referrals     Referral to hospice      Facility Type: Home-based    Did you call Hospice before placing this order?: Yes    Do you want ongoing co-management?: Yes    Call MD for:  difficulty breathing, headache or visual disturbances      Call MD for:  persistent nausea or vomiting      Call MD for:  severe uncontrolled pain      Call MD for:  temperature >38.5 Celsius Discharge instructions      Discharge instructions      Discharge instructions          Appointments which have been scheduled for you    Apr 14, 2021 11:00 AM  (Arrive by 10:45 AM)  Device Check with Wiliam Ke, FNP  [facility] [address] [facility] REGION) [address]  [address]  [place] [address]  [phone]           ______________________________________________________________________  Discharge Day Services:  BP 140/60  - Pulse 69  - Temp 36.4 ??C (Oral)  - Resp 16  - Wt 63.5 kg (139 lb 15.9 oz)  - SpO2 99%  - BMI 19.52 kg/m??   Pt seen on the day of discharge and determined appropriate for discharge.    Condition at Discharge: poor    Length of Discharge: I spent greater than 30 mins in the discharge of this patient.

## 2021-03-12 ENCOUNTER — Encounter: Admit: 2021-03-12 | Discharge: 2021-03-25 | Payer: MEDICARE

## 2021-03-12 ENCOUNTER — Inpatient Hospital Stay: Admit: 2021-03-12 | Discharge: 2021-03-25 | Payer: MEDICARE

## 2021-03-12 MED ORDER — OXYCODONE 5 MG TABLET
ORAL | 0 days | PRN
Start: 2021-03-12 — End: ?

## 2021-03-12 MED ORDER — LORAZEPAM 1 MG TABLET
Freq: Four times a day (QID) | ORAL | 0 days | PRN
Start: 2021-03-12 — End: ?

## 2021-03-12 MED ORDER — SENNOSIDES 8.6 MG-DOCUSATE SODIUM 50 MG TABLET
Freq: Every day | ORAL | 0 days
Start: 2021-03-12 — End: ?

## 2021-03-12 MED ORDER — BISACODYL 10 MG RECTAL SUPPOSITORY
Freq: Every day | RECTAL | 0 days | PRN
Start: 2021-03-12 — End: ?

## 2021-03-12 MED ORDER — HYOSCYAMINE 0.125 MG DISINTEGRATING TABLET
ORAL | 0 days | PRN
Start: 2021-03-12 — End: ?

## 2021-03-12 MED ORDER — PROCHLORPERAZINE MALEATE 10 MG TABLET
Freq: Four times a day (QID) | ORAL | 0 days | PRN
Start: 2021-03-12 — End: ?

## 2021-03-12 MED ORDER — CITALOPRAM 20 MG TABLET
Freq: Every day | ORAL | 0.00000 days
Start: 2021-03-12 — End: ?

## 2021-03-12 MED ORDER — ACETAMINOPHEN 650 MG RECTAL SUPPOSITORY
Freq: Four times a day (QID) | RECTAL | 0 days | PRN
Start: 2021-03-12 — End: ?

## 2021-03-12 MED ORDER — PANTOPRAZOLE 40 MG TABLET,DELAYED RELEASE
Freq: Two times a day (BID) | ORAL | 0 days
Start: 2021-03-12 — End: ?

## 2021-03-13 DIAGNOSIS — C61 Malignant neoplasm of prostate: Principal | ICD-10-CM

## 2021-03-13 DIAGNOSIS — C7951 Secondary malignant neoplasm of bone: Principal | ICD-10-CM

## 2021-03-13 DIAGNOSIS — M84451A Pathological fracture, right femur, initial encounter for fracture: Principal | ICD-10-CM

## 2021-03-13 MED ORDER — OXYCODONE 5 MG TABLET
ORAL_TABLET | ORAL | 0 refills | 20.00000 days | Status: CP | PRN
Start: 2021-03-13 — End: ?

## 2021-03-13 MED ORDER — MORPHINE CONCENTRATE 100 MG/5 ML (20 MG/ML) ORAL SOLUTION
ORAL | 0 refills | 8 days | PRN
Start: 2021-03-13 — End: ?

## 2021-03-15 NOTE — Unmapped (Signed)
----- Message from [name], RN sent at 03/15/2021 11:25 AM EDT -----  Regarding: RE: BRCA2 positive prostate patient  I spoke to patient's wife. They have everything they need.     His wife states that 2 of their children live in [place] and she has one in [place]. They are aware of the news. She wants to give them time and let them/her contact [employer] at their convenience. I gave her the office number.     Also, she says she is well aware of Bob's family tree, so even if he passes, she will be able to provide the information.       Thanks,    [name]  ----- Message -----  From: Jennye Moccasin, MD  Sent: 03/14/2021   3:54 PM EDT  To: Billey Chang O'Daniel, CGC, [name], RN  Subject: FW: BRCA2 positive prostate patient              [name],    Could you call and check on him and make sure he has everything that they need?And then offer a video visit with genetics? It would be useful to do for spouse/patient - lots of implications of BRCA2 for their family and would be good to get genetics the list of contact tracing needs, family tree etc before he dies    ThanksTracy  ----- Message -----  From: Vonna Drafts, CGC  Sent: 03/14/2021   3:43 PM EDT  To: Irma Newness, MD, #  Subject: RE: BRCA2 positive prostate patient              For this family, we can certainly set up a video visit or even phone visit with patient and spouse. If his kids are local they can be set up for visit and discussion as well can join with their dad in a family visit  if they are all amenable. The children can also contact us of their own accord.    Thanks for looping everyone in!     PS - I'll send updates from Ashlynn and her new little guy as we get them :-)    ----- Message -----  From: Jennye Moccasin, MD  Sent: 03/14/2021  10:34 AM EDT  To: Irma Newness, MD, #  Subject: RE: BRCA2 positive prostate patient              Lodema Pilot    He is home on hospice so I'll connect with his family and make sure they get set up with genetics    Thanks  French Ana  ----- Message -----  From: Joetta Manners, CGC  Sent: 03/14/2021  10:10 AM EDT  To: Irma Newness, MD, Jennye Moccasin, MD, #  Subject: RE: BRCA2 positive prostate patient              Hello,  I am on my way to the hospital myself to deliver but I am attaching Christiane Ha and Billey Chang to help with this discussion because I agree it would be helpful for genetics to be involved in this discussion. I too am sorry for his rapid decline and I completely agree unfortunately this is a lot to put on him at this time.    Thank you,Ashlynn   ----- Message -----  From: Roni Bread, MD  Sent: 03/10/2021   8:36 AM EDT  To: Jennye Moccasin, MD, Zettie Pho, MD, #  Subject: RE: BRCA2 positive prostate patient  Its been going on for 6-12 months.... just got worse lately.....     I can talk to him and hopefully his wife about BRCA but would be idealif genetics team can give some guidance too  ----- Message -----  From: Jennye Moccasin, MD  Sent: 03/09/2021   8:28 PM EDT  To: Zettie Pho, MD, Ashlynn Messmore, CGC, #  Subject: RE: BRCA2 positive prostate patient              I'm sorry to hear that :(  He actually looked relatively good a couple months ago so I did not expect this and did not prepare them for this    I am not sure why he is so orthostatic. Relugolix shouldn't do that but if going to hospice, could stop and give him a dose of leuprolide (biggest dose pharmacy will let you) just in case.    Ashlynn do you have advice about how not to lose his family/kids in follow-up? This is the downside for me of owning the germline results if patient dies and lots of contact tracing still needed. Can y'all do an inpatient consult with them?    Thanks for taking care of himTracy  ----- Message -----  From: Roni Bread, MD  Sent: 03/09/2021   6:32 PM EDT  To: Jennye Moccasin, MD, Zettie Pho, MD, #  Subject: RE: BRCA2 positive prostate patient              Our friend [name] is not doing well at all. He has had steady functional decline since his falls. He is terribly orthostatic and can barely work with PT at all-- we scanned his brain looking for CVA etc/ other causes, but doesn't have anthing. But he is orthostatic for sure.     He is eating about 500 calories a day, if that.     He and his wife have been talking, and he doesn't want to keep doing this. He wants to go home with hospice. Did discuss with him that he is not actively dying from prostatic cancer. But--its just the whole thing. He doesn't want to do the SNF, repeat hospitalization thing.     He just broke the news to his kids today. Now he has to tell them about BRCA. :(    The only good news is that he was VERY delirious for 3- 4 days. But he has joined [place] back in this world. Turns out new oxycontin, oxycodone, and marinol was too much (!!!)  ----- Message -----  From: Jennye Moccasin, MD  Sent: 03/09/2021   4:48 PM EDT  To: Zettie Pho, MD, Ashlynn Messmore, CGC, #  Subject: RE: BRCA2 positive prostate patient              Thanks - yes we sent the testing on admission so he likely has not heard results.    Med O team - FYI his germline testing confirmed BRCA2. If you get the chance to tell him, that'd be great and I can place referral to genetics. Otherwise I can talk with him at next visit vs come up on Monday if he's still inpatient.    ThanksTracy  ----- Message -----  From: Joetta Manners, CGC  Sent: 03/09/2021   1:50 PM EDT  To: Jennye Moccasin, MD  Subject: BRCA2 positive prostate patient                  Hello,  I  think we have discussed this patient previously but I just wanted to let you know his results are back and the somatic variant was picked up by [facility] germline. It looks like his mother and sister both had breast cancer as well. I sent the e-consult recommending referral to genetics (I think you may have already placed referral but I couldn't see it in his chart). I think he may be inpatient still I know you just saw him but wanted to give you a quick heads up.  Thank you I hope you are well!  Ashlynn

## 2021-03-18 DIAGNOSIS — C61 Malignant neoplasm of prostate: Principal | ICD-10-CM

## 2021-03-18 DIAGNOSIS — M84451A Pathological fracture, right femur, initial encounter for fracture: Principal | ICD-10-CM

## 2021-03-20 DIAGNOSIS — C7951 Secondary malignant neoplasm of bone: Principal | ICD-10-CM

## 2021-03-20 DIAGNOSIS — C61 Malignant neoplasm of prostate: Principal | ICD-10-CM

## 2021-03-20 DIAGNOSIS — M84451A Pathological fracture, right femur, initial encounter for fracture: Principal | ICD-10-CM

## 2021-03-20 MED ORDER — OXYCODONE 5 MG TABLET
ORAL_TABLET | ORAL | 0 refills | 10 days | Status: CP | PRN
Start: 2021-03-20 — End: ?

## 2021-03-21 DIAGNOSIS — C61 Malignant neoplasm of prostate: Principal | ICD-10-CM

## 2021-03-21 DIAGNOSIS — M84451A Pathological fracture, right femur, initial encounter for fracture: Principal | ICD-10-CM

## 2021-03-21 DIAGNOSIS — C7951 Secondary malignant neoplasm of bone: Principal | ICD-10-CM

## 2021-03-21 MED ORDER — OXYCODONE 5 MG TABLET
ORAL_TABLET | ORAL | 0 refills | 1 days | Status: CN | PRN
Start: 2021-03-21 — End: ?

## 2021-03-22 DIAGNOSIS — M84451A Pathological fracture, right femur, initial encounter for fracture: Principal | ICD-10-CM

## 2021-03-22 DIAGNOSIS — C61 Malignant neoplasm of prostate: Principal | ICD-10-CM

## 2021-03-22 MED ORDER — MORPHINE ER 15 MG TABLET,EXTENDED RELEASE
Freq: Two times a day (BID) | ORAL | 0 days
Start: 2021-03-22 — End: ?

## 2021-03-24 DIAGNOSIS — M84451A Pathological fracture, right femur, initial encounter for fracture: Principal | ICD-10-CM

## 2021-03-24 DIAGNOSIS — C61 Malignant neoplasm of prostate: Principal | ICD-10-CM

## 2021-03-26 ENCOUNTER — Encounter: Admit: 2021-03-26 | Discharge: 2021-04-17 | Payer: MEDICARE

## 2021-03-27 ENCOUNTER — Ambulatory Visit: Admit: 2021-03-27 | Payer: MEDICARE

## 2021-03-27 MED ORDER — OXYCODONE 5 MG TABLET
ORAL | 0.00000 days | PRN
Start: 2021-03-27 — End: ?

## 2021-03-30 NOTE — Unmapped (Signed)
1647   On call phone note      - pt's wife [name] [phone] called stating pt was having pain. oxycodone given at 1547// caller instructed to give dose of morphine now (dose reviewed)     CB at 1733 - pt more comfortable     Plan made for oxycodone to be given at 1930 if pain present and morphine will be used for breakthrough pain. Caller will contact on-call services for any questions/concerns. Please perform status check on pt in morning.  Notification left in [facility]    Harrol Novello RN

## 2021-03-31 DIAGNOSIS — C61 Malignant neoplasm of prostate: Principal | ICD-10-CM

## 2021-03-31 DIAGNOSIS — C7951 Secondary malignant neoplasm of bone: Principal | ICD-10-CM

## 2021-03-31 MED ORDER — OXYCODONE 5 MG TABLET
ORAL_TABLET | ORAL | 0 refills | 4 days | Status: CP | PRN
Start: 2021-03-31 — End: ?

## 2021-04-07 ENCOUNTER — Ambulatory Visit: Admit: 2021-04-07 | Payer: MEDICARE

## 2021-04-07 MED ORDER — LORAZEPAM 2 MG/ML ORAL CONCENTRATE
Freq: Four times a day (QID) | ORAL | 0 days | PRN
Start: 2021-04-07 — End: ?

## 2021-04-07 MED ORDER — LACTULOSE 10 GRAM/15 ML (15 ML) ORAL SOLUTION
Freq: Two times a day (BID) | ORAL | 0 days
Start: 2021-04-07 — End: ?

## 2021-04-07 MED ORDER — DEXAMETHASONE 2 MG TABLET
Freq: Every day | ORAL | 0 days
Start: 2021-04-07 — End: ?

## 2021-04-07 NOTE — Unmapped (Signed)
2226Bonita Gray, spouse called. Pt is having clear seepage of fluid from back or left arm. Caller not sure which. Stated when asked that there is no noticeable swelling. Also stated pt is not answering like he usually does to questions and is not rousable. Pt is moaning in his sleep. Today he was given Oxycodone at 3:45pm and 8:45 pm. He was given Morphine 0.66ml at 5:30pm and 9:30pm. On call nurse Vicky notified of changes with information provided for follow up. Doreene Nest. Roxan Hockey, RN

## 2021-04-09 MED ORDER — MORPHINE CONCENTRATE 100 MG/5 ML (20 MG/ML) ORAL SOLUTION
ORAL | 0 days | PRN
Start: 2021-04-09 — End: ?

## 2021-04-09 NOTE — Unmapped (Signed)
1815-Daughter Misty Stanley called reporting the pt was requesting a priest to administer   Last rites. I called Lynsie, and she said to call Brandy. Brandy said to call the [facility] and ask for a priest.   The [facility] gave me Father Thayer Ohm #.  I called him and he gave me the # to the [facility] # [phone].  I left a voicemail, and Father Renae Fickle called me back and I gave him the information to call the pt.   [employer] RN

## 2021-04-10 DIAGNOSIS — C7951 Secondary malignant neoplasm of bone: Principal | ICD-10-CM

## 2021-04-10 DIAGNOSIS — C61 Malignant neoplasm of prostate: Principal | ICD-10-CM

## 2021-04-10 MED ORDER — MORPHINE CONCENTRATE 100 MG/5 ML (20 MG/ML) ORAL SOLUTION
ORAL | 0 refills | 8 days | Status: CP | PRN
Start: 2021-04-10 — End: ?

## 2021-04-10 MED ORDER — OXYCODONE 5 MG TABLET
ORAL_TABLET | ORAL | 0 refills | 4 days | Status: CP | PRN
Start: 2021-04-10 — End: ?

## 2021-04-11 MED ORDER — OXYCODONE 5 MG TABLET
ORAL | 0 days | PRN
Start: 2021-04-11 — End: ?

## 2021-04-12 NOTE — Unmapped (Signed)
Pt is currently under Hospice care for metastatic cancer.  His monitor has been disconnected since his Admission to the hospital that had him discharged to Hospice at home.  His yearly in office visit set for Apr 14, 2021 for his Loop monitor was cancelled by his family and refused to reschedule as he is totally bedridden per Hospice notes and not able to attend outside appts.    Will not call as monitoring of LOOP recorder is probably not important in scheme of things at present.    Will forward to provider to determine if monitoring should be discontinued altogether or an alternative plan.

## 2021-04-13 NOTE — Unmapped (Signed)
Per instruction from Dr Patrecia Pace after note below forwarded to him will Discontinue attempts at monitoring of patient's LOOP recorder.

## 2021-04-17 NOTE — Unmapped (Signed)
[name], son called to report pt passed. Offered condolences on behalf of hospice.  Notified on call nurse [facility] with information provided for follow up. Doreene Nest. Roxan Hockey, RN

## 2021-04-19 DIAGNOSIS — I6523 Occlusion and stenosis of bilateral carotid arteries: Principal | ICD-10-CM

## 2021-04-19 DIAGNOSIS — M810 Age-related osteoporosis without current pathological fracture: Principal | ICD-10-CM

## 2021-04-19 DIAGNOSIS — I2510 Atherosclerotic heart disease of native coronary artery without angina pectoris: Principal | ICD-10-CM

## 2021-04-19 DIAGNOSIS — Z66 Do not resuscitate: Principal | ICD-10-CM

## 2021-04-19 DIAGNOSIS — M84551D Pathological fracture in neoplastic disease, right femur, subsequent encounter for fracture with routine healing: Principal | ICD-10-CM

## 2021-04-19 DIAGNOSIS — N183 Chronic kidney disease, stage 3 unspecified: Principal | ICD-10-CM

## 2021-04-19 DIAGNOSIS — I739 Peripheral vascular disease, unspecified: Principal | ICD-10-CM

## 2021-04-19 DIAGNOSIS — C7951 Secondary malignant neoplasm of bone: Principal | ICD-10-CM

## 2021-04-19 DIAGNOSIS — Z952 Presence of prosthetic heart valve: Principal | ICD-10-CM

## 2021-04-19 DIAGNOSIS — N179 Acute kidney failure, unspecified: Principal | ICD-10-CM

## 2021-04-19 DIAGNOSIS — I340 Nonrheumatic mitral (valve) insufficiency: Principal | ICD-10-CM

## 2021-04-19 DIAGNOSIS — G9340 Encephalopathy, unspecified: Principal | ICD-10-CM

## 2021-04-19 DIAGNOSIS — I129 Hypertensive chronic kidney disease with stage 1 through stage 4 chronic kidney disease, or unspecified chronic kidney disease: Principal | ICD-10-CM

## 2021-04-19 DIAGNOSIS — L89153 Pressure ulcer of sacral region, stage 3: Principal | ICD-10-CM

## 2021-04-19 DIAGNOSIS — Z951 Presence of aortocoronary bypass graft: Principal | ICD-10-CM

## 2021-04-19 DIAGNOSIS — Z9981 Dependence on supplemental oxygen: Principal | ICD-10-CM

## 2021-04-19 DIAGNOSIS — M8458XD Pathological fracture in neoplastic disease, other specified site, subsequent encounter for fracture with routine healing: Principal | ICD-10-CM

## 2021-04-19 DIAGNOSIS — C61 Malignant neoplasm of prostate: Principal | ICD-10-CM

## 2021-04-19 DIAGNOSIS — E43 Unspecified severe protein-calorie malnutrition: Principal | ICD-10-CM

## 2021-04-19 DIAGNOSIS — J939 Pneumothorax, unspecified: Principal | ICD-10-CM

## 2021-04-19 DIAGNOSIS — F17210 Nicotine dependence, cigarettes, uncomplicated: Principal | ICD-10-CM

## 2021-04-19 DIAGNOSIS — Z9181 History of falling: Principal | ICD-10-CM

## 2021-04-19 DIAGNOSIS — G893 Neoplasm related pain (acute) (chronic): Principal | ICD-10-CM

## 2021-04-19 DIAGNOSIS — Z8719 Personal history of other diseases of the digestive system: Principal | ICD-10-CM

## 2021-04-19 DIAGNOSIS — I361 Nonrheumatic tricuspid (valve) insufficiency: Principal | ICD-10-CM

## 2021-04-19 DIAGNOSIS — I480 Paroxysmal atrial fibrillation: Principal | ICD-10-CM

## 2021-04-26 MED ORDER — PANTOPRAZOLE 40 MG TABLET,DELAYED RELEASE
ORAL_TABLET | 0 refills | 0.00000 days
Start: 2021-04-26 — End: ?

## 2021-04-26 DEATH — deceased

## 2021-06-23 ENCOUNTER — Ambulatory Visit: Payer: Medicare Other | Admitting: Nurse Practitioner

## 2022-11-03 IMAGING — CR DG PELVIS 1-2V
1 series · 1 of 1 positions shown · non-contrast
Comparison: CT scan 02/08/2021

CLINICAL DATA: Fell. Right leg pain. History of metastatic prostate
cancer.

EXAM:
PELVIS - 1-2 VIEW

[pelvis ap]
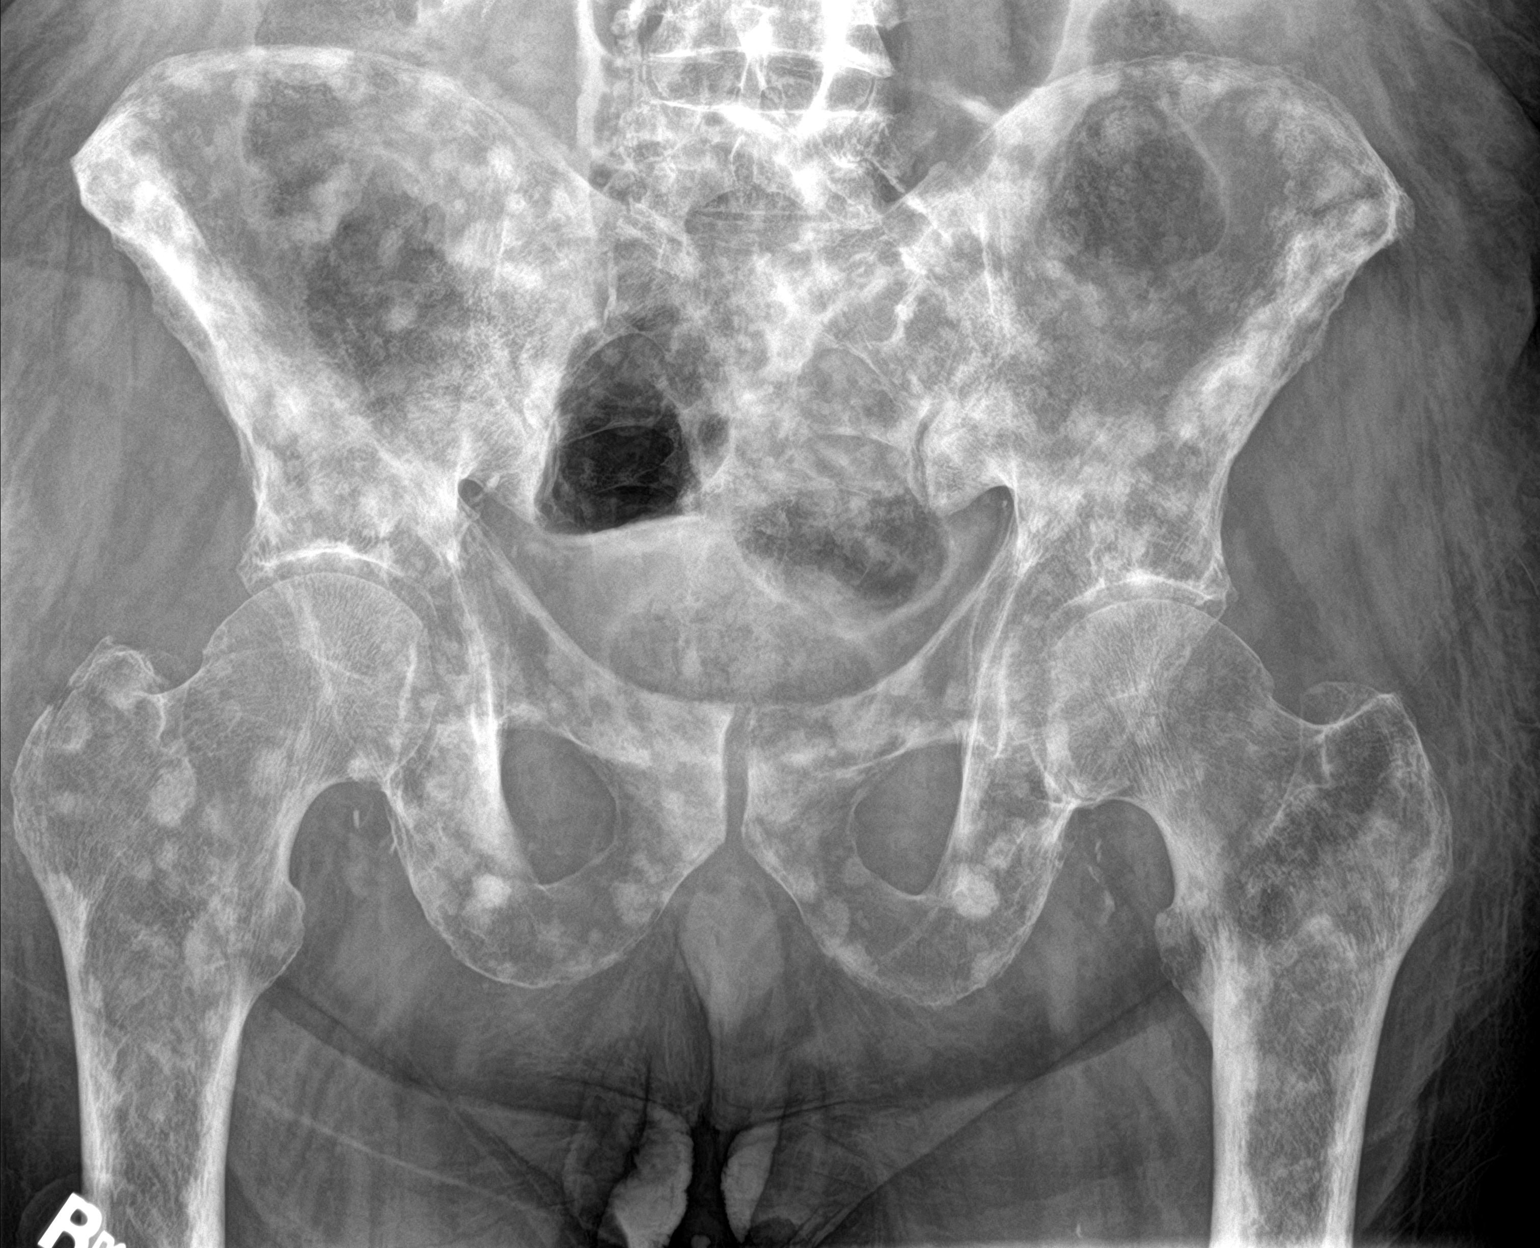

[1 of 1 positions shown; findings below may reference images not displayed]

FINDINGS: Both hips are normally located. Diffuse sclerotic metastatic bone
disease. No acute hip fracture is identified. No pelvic fractures.
The pubic symphysis and SI joints are intact.
IMPRESSION: No acute bony findings.
